# Patient Record
Sex: Female | Born: 1965 | Race: White | Hispanic: No | Marital: Married | Smoking: Never smoker
Health system: Southern US, Community
[De-identification: ages and names within clinical notes are randomized; demographics above are authoritative.]

## PROBLEM LIST (undated history)

## (undated) DIAGNOSIS — D496 Neoplasm of unspecified behavior of brain: Secondary | ICD-10-CM

## (undated) DIAGNOSIS — T7421XA Adult sexual abuse, confirmed, initial encounter: Secondary | ICD-10-CM

## (undated) DIAGNOSIS — D649 Anemia, unspecified: Secondary | ICD-10-CM

## (undated) DIAGNOSIS — T7840XA Allergy, unspecified, initial encounter: Secondary | ICD-10-CM

## (undated) HISTORY — DX: Allergy, unspecified, initial encounter: T78.40XA

## (undated) HISTORY — DX: Anemia, unspecified: D64.9

---
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
## 2002-11-05 ENCOUNTER — Other Ambulatory Visit: Admission: RE | Admit: 2002-11-05 | Discharge: 2002-11-05 | Payer: Self-pay | Admitting: Internal Medicine

## 2004-01-03 ENCOUNTER — Other Ambulatory Visit: Admission: RE | Admit: 2004-01-03 | Discharge: 2004-01-03 | Payer: Self-pay | Admitting: Internal Medicine

## 2005-01-02 ENCOUNTER — Other Ambulatory Visit: Admission: RE | Admit: 2005-01-02 | Discharge: 2005-01-02 | Payer: Self-pay | Admitting: Internal Medicine

## 2006-01-02 ENCOUNTER — Other Ambulatory Visit: Admission: RE | Admit: 2006-01-02 | Discharge: 2006-01-02 | Payer: Self-pay | Admitting: Internal Medicine

## 2006-01-24 ENCOUNTER — Encounter: Admission: RE | Admit: 2006-01-24 | Discharge: 2006-01-24 | Payer: Self-pay | Admitting: Internal Medicine

## 2006-02-12 ENCOUNTER — Encounter: Admission: RE | Admit: 2006-02-12 | Discharge: 2006-02-12 | Payer: Self-pay | Admitting: Internal Medicine

## 2007-05-19 ENCOUNTER — Other Ambulatory Visit: Admission: RE | Admit: 2007-05-19 | Discharge: 2007-05-19 | Payer: Self-pay | Admitting: Internal Medicine

## 2009-11-08 ENCOUNTER — Other Ambulatory Visit: Admission: RE | Admit: 2009-11-08 | Discharge: 2009-11-08 | Payer: Self-pay | Admitting: Internal Medicine

## 2013-02-03 ENCOUNTER — Other Ambulatory Visit (HOSPITAL_COMMUNITY)
Admission: RE | Admit: 2013-02-03 | Discharge: 2013-02-03 | Disposition: A | Discharge status: Home / Self Care | Payer: PRIVATE HEALTH INSURANCE | Source: Ambulatory Visit | Attending: Family Medicine | Admitting: Family Medicine

## 2013-02-03 DIAGNOSIS — Z1151 Encounter for screening for human papillomavirus (HPV): Secondary | ICD-10-CM | POA: Insufficient documentation

## 2013-02-03 DIAGNOSIS — Z01419 Encounter for gynecological examination (general) (routine) without abnormal findings: Secondary | ICD-10-CM | POA: Insufficient documentation

## 2013-02-03 DIAGNOSIS — R87810 Cervical high risk human papillomavirus (HPV) DNA test positive: Secondary | ICD-10-CM | POA: Insufficient documentation

## 2013-03-22 ENCOUNTER — Other Ambulatory Visit: Payer: Self-pay | Admitting: Emergency Medicine

## 2013-03-22 MED ORDER — NORETHIN ACE-ETH ESTRAD-FE 1-20 MG-MCG PO TABS: 1.0000 | ORAL_TABLET | Freq: Every day | ORAL | Status: DC

## 2013-06-12 ENCOUNTER — Encounter: Payer: Self-pay | Admitting: *Deleted

## 2013-06-12 DIAGNOSIS — T7840XA Allergy, unspecified, initial encounter: Secondary | ICD-10-CM | POA: Insufficient documentation

## 2013-06-15 ENCOUNTER — Encounter: Payer: Self-pay | Admitting: Emergency Medicine

## 2013-07-15 ENCOUNTER — Encounter: Payer: Self-pay | Admitting: Emergency Medicine

## 2014-06-16 ENCOUNTER — Encounter: Payer: Self-pay | Admitting: Emergency Medicine

## 2014-07-19 ENCOUNTER — Encounter: Payer: Self-pay | Admitting: Emergency Medicine

## 2015-05-08 ENCOUNTER — Other Ambulatory Visit: Payer: Self-pay

## 2015-05-08 DIAGNOSIS — Z1231 Encounter for screening mammogram for malignant neoplasm of breast: Secondary | ICD-10-CM

## 2015-05-23 ENCOUNTER — Ambulatory Visit
Admission: RE | Admit: 2015-05-23 | Discharge: 2015-05-23 | Disposition: A | Discharge status: Home / Self Care | Payer: BLUE CROSS/BLUE SHIELD | Source: Ambulatory Visit

## 2015-05-23 DIAGNOSIS — Z1231 Encounter for screening mammogram for malignant neoplasm of breast: Secondary | ICD-10-CM

## 2017-08-01 ENCOUNTER — Ambulatory Visit: Payer: BLUE CROSS/BLUE SHIELD | Admitting: Physician Assistant

## 2017-08-01 VITALS — BP 122/76 | HR 84 | Temp 98.1°F | Resp 16 | Ht 62.0 in | Wt 105.2 lb

## 2017-08-01 DIAGNOSIS — E559 Vitamin D deficiency, unspecified: Secondary | ICD-10-CM

## 2017-08-01 DIAGNOSIS — W57XXXA Bitten or stung by nonvenomous insect and other nonvenomous arthropods, initial encounter: Secondary | ICD-10-CM

## 2017-08-01 MED ORDER — DOXYCYCLINE HYCLATE 100 MG PO CAPS: ORAL_CAPSULE | ORAL | 0 refills | Status: DC

## 2017-08-01 NOTE — Progress Notes (Signed)
   Subjective:    Patient ID: Kristen Foley, female    DOB: 1965/05/31, 52 y.o.   MRN: [id number]  HPI 52 y.o. WF presents with tick bite. States was working in her yard and thinks she got it Monday evening and Tuesday morning noticed tick on left inguinal area. She has had some welps at the site but she has had other rashes/bruising on bilateral thighs.   No fever, chills, joint pain. Some discomfort behind left knee.   Blood pressure 122/76, pulse 84, temperature 98.1 F (36.7 C), resp. rate 16, height 5\' 2"  (1.575 m), weight 105 lb 3.2 oz (47.7 kg).  Medications Current Outpatient Medications on File Prior to Visit  Medication Sig  . aspirin 81 MG tablet Take 81 mg by mouth daily.  . Cholecalciferol (VITAMIN D) 2000 UNITS tablet Take 2,000 Units by mouth daily.   No current facility-administered medications on file prior to visit.     Problem list She has Allergy on their problem list.  Review of Systems See HPI    Objective:   Physical Exam  Skin: Skin is warm and dry.  Left inguinal area with scabbed area, middle of left inner thigh and right medial thigh with rash with central clearing. No lymphadenopathy.       Assessment & Plan:    Tick bite, initial encounter With rash with central clearing, will treat for possible lyme, 1 month Get on probiotic in the mean time If any yeast infections call the office.  -     doxycycline (VIBRAMYCIN) 100 MG capsule; Take 1 capsule twice daily with food

## 2017-08-01 NOTE — Patient Instructions (Signed)
Get on probiotic like align or restora while on the antitbiotic and for 1 month afterwards   Lyme Disease Lyme disease is an infection that affects many parts of the body, including the skin, joints, and nervous system. It is a bacterial infection that starts from the bite of an infected tick. The infection can spread, and some of the symptoms are similar to the flu. If Lyme disease is not treated, it may cause joint pain, swelling, numbness, problems thinking, fatigue, muscle weakness, and other problems. What are the causes? This condition is caused by bacteria called Borrelia burgdorferi. You can get Lyme disease by being bitten by an infected tick. The tick must be attached to your skin to pass along the infection. Deer often carry infected ticks. What increases the risk? The following factors may make you more likely to develop this condition:  Living in or visiting these areas in the [place] ? [place]. ? The [place]. ? The [place].  Spending time in wooded or grassy areas.  Being outdoors with exposed skin.  Camping, gardening, hiking, fishing, or hunting outdoors.  Failing to remove a tick from your skin within 3-4 days.  What are the signs or symptoms? Symptoms of this condition include:  A round, red rash that surrounds the center of the tick bite. This is the first sign of infection. The center of the rash may be blood colored or have tiny blisters.  Fatigue.  Headache.  Chills and fever.  General achiness.  Joint pain, often in the knees.  Muscle pain.  Swollen lymph glands.  Stiff neck.  How is this diagnosed? This condition is diagnosed based on:  Your symptoms and medical history.  A physical exam.  A blood test.  How is this treated? The main treatment for this condition is antibiotic medicine, which is usually taken by mouth (orally). The length of treatment depends on how soon after a tick bite you begin taking the medicine.  In some cases, treatment is necessary for several weeks. If the infection is severe, antibiotics may need to be given through an IV tube that is inserted into one of your veins. Follow these instructions at home:  Take your antibiotic medicine as told by your health care provider. Do not stop taking the antibiotic even if you start to feel better.  Ask your health care provider about takinga probiotic in between doses of your antibiotic to help avoid stomach upset or diarrhea.  Check with your health care provider before supplementing your treatment. Many alternative therapies have not been proven and may be harmful to you.  Keep all follow-up visits as told by your health care provider. This is important. How is this prevented? You can become reinfected if you get another tick bite from an infected tick. Take these steps to help prevent an infection:  Cover your skin with light-colored clothing when you are outdoors in the spring and summer months.  Spray clothing and skin with bug spray. The spray should be 20-30% DEET.  Avoid wooded, grassy, and shaded areas.  Remove yard litter, brush, trash, and plants that attract deer and rodents.  Check yourself for ticks when you come indoors.  Wash clothing worn each day.  Check your pets for ticks before they come inside.  If you find a tick: ? Remove it with tweezers. ? Clean your hands and the bite area with rubbing alcohol or soap and water.  Pregnant women should take special care to avoid tick  bites because the infection can be passed along to the fetus. Contact a health care provider if:  You have symptoms after treatment.  You have removed a tick and want to bring it to your health care provider for testing. Get help right away if:  You have an irregular heartbeat.  You have nerve pain.  Your face feels numb. This information is not intended to replace advice given to you by your health care provider. Make sure you  discuss any questions you have with your health care provider. Document Released: 07/01/2000 Document Revised: 11/14/2015 Document Reviewed: 11/14/2015 Elsevier Interactive Patient Education  2018 [employer].

## 2018-02-16 ENCOUNTER — Ambulatory Visit: Payer: Self-pay | Admitting: Internal Medicine

## 2018-02-16 NOTE — Progress Notes (Signed)
     C  A N  C  E L  L  E  D          

## 2018-03-09 ENCOUNTER — Other Ambulatory Visit: Payer: Self-pay | Admitting: Internal Medicine

## 2018-03-09 ENCOUNTER — Ambulatory Visit
Admission: RE | Admit: 2018-03-09 | Discharge: 2018-03-09 | Disposition: A | Discharge status: Home / Self Care | Payer: BLUE CROSS/BLUE SHIELD | Source: Ambulatory Visit | Attending: Internal Medicine | Admitting: Internal Medicine

## 2018-03-09 DIAGNOSIS — Z1231 Encounter for screening mammogram for malignant neoplasm of breast: Secondary | ICD-10-CM

## 2018-03-18 ENCOUNTER — Encounter: Payer: Self-pay | Admitting: Internal Medicine

## 2018-03-18 ENCOUNTER — Ambulatory Visit: Payer: BLUE CROSS/BLUE SHIELD | Admitting: Internal Medicine

## 2018-03-18 ENCOUNTER — Other Ambulatory Visit (HOSPITAL_COMMUNITY)
Admission: RE | Admit: 2018-03-18 | Discharge: 2018-03-18 | Disposition: A | Discharge status: Home / Self Care | Payer: BLUE CROSS/BLUE SHIELD | Source: Ambulatory Visit | Attending: Internal Medicine | Admitting: Internal Medicine

## 2018-03-18 VITALS — BP 116/76 | HR 84 | Temp 97.5°F | Resp 16 | Ht 62.0 in | Wt 105.0 lb

## 2018-03-18 DIAGNOSIS — Z136 Encounter for screening for cardiovascular disorders: Secondary | ICD-10-CM

## 2018-03-18 DIAGNOSIS — R7309 Other abnormal glucose: Secondary | ICD-10-CM

## 2018-03-18 DIAGNOSIS — Z124 Encounter for screening for malignant neoplasm of cervix: Secondary | ICD-10-CM | POA: Insufficient documentation

## 2018-03-18 DIAGNOSIS — Z0001 Encounter for general adult medical examination with abnormal findings: Secondary | ICD-10-CM

## 2018-03-18 DIAGNOSIS — Z1329 Encounter for screening for other suspected endocrine disorder: Secondary | ICD-10-CM

## 2018-03-18 DIAGNOSIS — Z131 Encounter for screening for diabetes mellitus: Secondary | ICD-10-CM | POA: Diagnosis not present

## 2018-03-18 DIAGNOSIS — E559 Vitamin D deficiency, unspecified: Secondary | ICD-10-CM | POA: Diagnosis not present

## 2018-03-18 DIAGNOSIS — Z111 Encounter for screening for respiratory tuberculosis: Secondary | ICD-10-CM | POA: Diagnosis not present

## 2018-03-18 DIAGNOSIS — Z1211 Encounter for screening for malignant neoplasm of colon: Secondary | ICD-10-CM

## 2018-03-18 DIAGNOSIS — Z13220 Encounter for screening for lipoid disorders: Secondary | ICD-10-CM | POA: Diagnosis not present

## 2018-03-18 DIAGNOSIS — Z79899 Other long term (current) drug therapy: Secondary | ICD-10-CM | POA: Diagnosis not present

## 2018-03-18 DIAGNOSIS — Z8249 Family history of ischemic heart disease and other diseases of the circulatory system: Secondary | ICD-10-CM | POA: Insufficient documentation

## 2018-03-18 DIAGNOSIS — R030 Elevated blood-pressure reading, without diagnosis of hypertension: Secondary | ICD-10-CM | POA: Diagnosis not present

## 2018-03-18 DIAGNOSIS — Z130 Encounter for screening for diseases of the blood and blood-forming organs and certain disorders involving the immune mechanism: Secondary | ICD-10-CM | POA: Diagnosis not present

## 2018-03-18 DIAGNOSIS — R5383 Other fatigue: Secondary | ICD-10-CM

## 2018-03-18 DIAGNOSIS — Z0000 Encounter for general adult medical examination without abnormal findings: Secondary | ICD-10-CM | POA: Diagnosis not present

## 2018-03-18 DIAGNOSIS — Z1212 Encounter for screening for malignant neoplasm of rectum: Secondary | ICD-10-CM

## 2018-03-18 DIAGNOSIS — Z1389 Encounter for screening for other disorder: Secondary | ICD-10-CM

## 2018-03-18 DIAGNOSIS — E782 Mixed hyperlipidemia: Secondary | ICD-10-CM

## 2018-03-18 NOTE — Patient Instructions (Signed)

## 2018-03-18 NOTE — Progress Notes (Signed)
[facility] Unk Pinto, M.D.     Uvaldo Bristle. Silverio Lay, P.A.-C Liane Comber, [address] [place] [address] Telephone [phone] Telefax [phone] Annual Screening/Preventative Visit & Comprehensive Evaluation &  Examination     This very nice 52 y.o. MWM presents for a Screening /Preventative Visit & comprehensive evaluation and management of multiple medical co-morbidities.  Patient has been followed expectantly for for elevated BP, lipids, abnormal glucose  and Vitamin D Deficiency.      Her  BP has been controlled  and patient denies any cardiac symptoms as chest pain, palpitations, shortness of breath, dizziness or ankle swelling. Today's BP is at goal - 116/76.      Patient's lipids are controlled with diet. Last lipids were sl elevated in with a LDL of 112 in 2007.      Patient has is monitored expectantly for glucose intolerance and patient denies reactive hypoglycemic symptoms, visual blurring, diabetic polys or paresthesias.      Finally, patient has history of Vitamin D Deficiency ("17" / 2009).  Current Outpatient Medications on File Prior to Visit  Medication Sig  . VITAMIN ) 2000 UNITS Take 2,000 Units by mouth daily.  Marland Kitchen aspirin 81 MG tablet Take 81 mg by mouth daily.   Allergies  Allergen Reactions  . Lexapro [Escitalopram Oxalate]   . Wellbutrin [Bupropion]   . Zoloft [Sertraline Hcl]    Past Medical History:  Diagnosis Date  . Allergy   . Anemia    Health Maintenance  Topic Date Due  . HIV Screening  04/02/1983  . PAP SMEAR  04/01/1989  . INFLUENZA VACCINE  02/07/2019 (Originally 11/06/2017)  . TETANUS/TDAP  03/19/2019 (Originally 04/02/1987)   Immunization History  Administered Date(s) Administered  . PPD Test 03/18/2018    Last MGM - 03/10/2018  Past Surgical History:  Procedure Laterality Date  . [place]   History reviewed.  No pertinent family history. Social History   Tobacco Use  . Smoking status: Not on file  Substance Use Topics  . Alcohol use: Not on file  . Drug use: Not on file    ROS Constitutional: Denies fever, chills, weight loss/gain, headaches, insomnia,  night sweats, and change in appetite. Does c/o fatigue. Eyes: Denies redness, blurred vision, diplopia, discharge, itchy, watery eyes.  ENT: Denies discharge, congestion, post nasal drip, epistaxis, sore throat, earache, hearing loss, dental pain, Tinnitus, Vertigo, Sinus pain, snoring.  Cardio: Denies chest pain, palpitations, irregular heartbeat, syncope, dyspnea, diaphoresis, orthopnea, PND, claudication, edema Respiratory: denies cough, dyspnea, DOE, pleurisy, hoarseness, laryngitis, wheezing.  Gastrointestinal: Denies dysphagia, heartburn, reflux, water brash, pain, cramps, nausea, vomiting, bloating, diarrhea, constipation, hematemesis, melena, hematochezia, jaundice, hemorrhoids Genitourinary: Denies dysuria, frequency, urgency, nocturia, hesitancy, discharge, hematuria, flank pain Breast: Breast lumps, nipple discharge, bleeding.  Musculoskeletal: Denies arthralgia, myalgia, stiffness, Jt. Swelling, pain, limp, and strain/sprain. Denies falls. Skin: Denies puritis, rash, hives, warts, acne, eczema, changing in skin lesion Neuro: No weakness, tremor, incoordination, spasms, paresthesia, pain Psychiatric: Denies confusion, memory loss, sensory loss. Denies Depression. Endocrine: Denies change in weight, skin, hair change, nocturia, and paresthesia, diabetic polys, visual blurring, hyper / hypo glycemic episodes.  Heme/Lymph: No excessive bleeding, bruising, enlarged lymph nodes.  Physical Exam  BP 116/76   Pulse 84   Temp (!) 97.5 F (36.4 C)   Resp 16   Ht 5\' 2"  (1.575 m)   Wt 105 lb (47.6 kg)   BMI 19.20 kg/m  General Appearance: Well nourished, well groomed and in no apparent distress.  Eyes: PERRLA, EOMs, conjunctiva no  swelling or erythema, normal fundi and vessels. Sinuses: No frontal/maxillary tenderness ENT/Mouth: EACs patent / TMs  nl. Nares clear without erythema, swelling, mucoid exudates. Oral hygiene is good. No erythema, swelling, or exudate. Tongue normal, non-obstructing. Tonsils not swollen or erythematous. Hearing normal.  Neck: Supple, thyroid not palpable. No bruits, nodes or JVD. Respiratory: Respiratory effort normal.  BS equal and clear bilateral without rales, rhonci, wheezing or stridor. Cardio: Heart sounds are normal with regular rate and rhythm and no murmurs, rubs or gallops. Peripheral pulses are normal and equal bilaterally without edema. No aortic or femoral bruits. Chest: symmetric with normal excursions and percussion. Breasts: Symmetric, without lumps, nipple discharge, retractions, or fibrocystic changes.  Abdomen: Flat, soft with bowel sounds active. Nontender, no guarding, rebound, hernias, masses, or organomegaly.  Lymphatics: Non tender without lymphadenopathy.  Genitourinary: Pap taken at Cx.  Musculoskeletal: Full ROM all peripheral extremities, joint stability, 5/5 strength, and normal gait. Skin: Warm and dry without rashes, lesions, cyanosis, clubbing or  ecchymosis.  Neuro: Cranial nerves intact, reflexes equal bilaterally. Normal muscle tone, no cerebellar symptoms. Sensation intact.  Pysch: Alert and oriented X 3, normal affect, Insight and Judgment appropriate.   Assessment and Plan  1. Annual Preventative Screening Examination  2. Elevated BP without diagnosis of hypertension  - EKG 12-Lead - Urinalysis, Routine w reflex microscopic - Microalbumin / creatinine urine ratio - CBC with Differential/Platelet - COMPLETE METABOLIC PANEL WITH GFR - Magnesium - TSH  3. Hyperlipidemia, mixed  - Lipid panel - TSH  4. Abnormal glucose  - Insulin, random  5. Vitamin D deficiency  - VITAMIN D 25 Hydroxyl  6. Screening-pulmonary TB  - TB Skin Test  7.  Screening for colorectal cancer  - POC Hemoccult Bld/Stl  - Cologuard  8. Screening for ischemic heart disease  - EKG 12-Lead - Lipid panel  9. FH: hypertension  - EKG 12-Lead  10. Fatigue, unspecified type  - Iron,Total/Total Iron Binding Cap - Vitamin B12 - CBC with Differential/Platelet - TSH  11. Medication management  - Urinalysis, Routine w reflex microscopic - Microalbumin / creatinine urine ratio - CBC with Differential/Platelet - COMPLETE METABOLIC PANEL WITH GFR - Magnesium - Lipid panel - TSH - Hemoglobin A1c - Insulin, random - VITAMIN D 25 Hydroxyl  12. Screening for cervical cancer  - Cytology -  pap        Patient was counseled in prudent diet to achieve/maintain BMI less than 25 for weight control, BP monitoring, regular exercise and medications. Discussed med's effects and SE's. Screening labs and tests as requested with regular follow-up as recommended. Over 40 minutes of exam, counseling, chart review and high complex critical decision making was performed.

## 2018-03-19 LAB — LIPID PANEL
Cholesterol: 206 mg/dL — ABNORMAL HIGH (ref <200)
HDL: 95 mg/dL (ref >50)
LDL Cholesterol (Calc): 94 mg/dL (calc)
Non-HDL Cholesterol (Calc): 111 mg/dL (calc) (ref <130)
TRIGLYCERIDES: 81 mg/dL (ref <150)
Total CHOL/HDL Ratio: 2.2 (calc) (ref <5.0)

## 2018-03-19 LAB — CBC WITH DIFFERENTIAL/PLATELET
BASOS ABS: 24 {cells}/uL (ref 0–200)
Basophils Relative: 0.3 %
Eosinophils Absolute: 32 cells/uL (ref 15–500)
Eosinophils Relative: 0.4 %
HEMATOCRIT: 40.4 % (ref 35.0–45.0)
HEMOGLOBIN: 13.6 g/dL (ref 11.7–15.5)
Lymphs Abs: 1272 cells/uL (ref 850–3900)
MCH: 31.6 pg (ref 27.0–33.0)
MCHC: 33.7 g/dL (ref 32.0–36.0)
MCV: 93.7 fL (ref 80.0–100.0)
MONOS PCT: 6.2 %
MPV: 10.7 fL (ref 7.5–12.5)
Neutro Abs: 6083 cells/uL (ref 1500–7800)
Neutrophils Relative %: 77 %
Platelets: 258 10*3/uL (ref 140–400)
RBC: 4.31 10*6/uL (ref 3.80–5.10)
RDW: 11.5 % (ref 11.0–15.0)
Total Lymphocyte: 16.1 %
WBC: 7.9 10*3/uL (ref 3.8–10.8)
WBCMIX: 490 {cells}/uL (ref 200–950)

## 2018-03-19 LAB — URINALYSIS, ROUTINE W REFLEX MICROSCOPIC
BILIRUBIN URINE: NEGATIVE
Bacteria, UA: NONE SEEN /HPF
Glucose, UA: NEGATIVE
Hgb urine dipstick: NEGATIVE
Hyaline Cast: NONE SEEN /LPF
Ketones, ur: NEGATIVE
Nitrite: NEGATIVE
PH: 6.5 (ref 5.0–8.0)
Protein, ur: NEGATIVE
RBC / HPF: NONE SEEN /HPF (ref 0–2)
Specific Gravity, Urine: 1.009 (ref 1.001–1.03)

## 2018-03-19 LAB — COMPLETE METABOLIC PANEL WITH GFR
AG Ratio: 1.7 (calc) (ref 1.0–2.5)
ALBUMIN MSPROF: 4.6 g/dL (ref 3.6–5.1)
ALT: 10 U/L (ref 6–29)
AST: 15 U/L (ref 10–35)
Alkaline phosphatase (APISO): 55 U/L (ref 33–115)
BUN: 17 mg/dL (ref 7–25)
CALCIUM: 9.9 mg/dL (ref 8.6–10.2)
CHLORIDE: 104 mmol/L (ref 98–110)
CO2: 27 mmol/L (ref 20–32)
CREATININE: 0.92 mg/dL (ref 0.50–1.10)
GFR, EST NON AFRICAN AMERICAN: 73 mL/min/{1.73_m2} (ref >=60)
GFR, Est African American: 85 mL/min/{1.73_m2} (ref >=60)
Globulin: 2.7 g/dL (calc) (ref 1.9–3.7)
Glucose, Bld: 89 mg/dL (ref 65–99)
Potassium: 4.1 mmol/L (ref 3.5–5.3)
SODIUM: 140 mmol/L (ref 135–146)
Total Bilirubin: 0.5 mg/dL (ref 0.2–1.2)
Total Protein: 7.3 g/dL (ref 6.1–8.1)

## 2018-03-19 LAB — HEMOGLOBIN A1C
EAG (MMOL/L): 5.4 (calc)
Hgb A1c MFr Bld: 5 % of total Hgb (ref <5.7)
MEAN PLASMA GLUCOSE: 97 (calc)

## 2018-03-19 LAB — MICROALBUMIN / CREATININE URINE RATIO: Creatinine, Urine: 33 mg/dL (ref 20–275)

## 2018-03-19 LAB — MAGNESIUM: MAGNESIUM: 1.8 mg/dL (ref 1.5–2.5)

## 2018-03-19 LAB — INSULIN, RANDOM: Insulin: 4.1 u[IU]/mL (ref 2.0–19.6)

## 2018-03-19 LAB — VITAMIN D 25 HYDROXY (VIT D DEFICIENCY, FRACTURES): VIT D 25 HYDROXY: 46 ng/mL (ref 30–100)

## 2018-03-19 LAB — VITAMIN B12: Vitamin B-12: 365 pg/mL (ref 200–1100)

## 2018-03-19 LAB — IRON, TOTAL/TOTAL IRON BINDING CAP
%SAT: 18 % (calc) (ref 16–45)
Iron: 62 ug/dL (ref 40–190)
TIBC: 353 mcg/dL (calc) (ref 250–450)

## 2018-03-19 LAB — TSH: TSH: 0.94 mIU/L

## 2018-03-19 NOTE — Addendum Note (Signed)
Addended by: Unk Pinto on: 03/19/2018 02:14 PM   Modules accepted: Orders

## 2018-03-20 LAB — TB SKIN TEST
INDURATION: 0 mm
TB SKIN TEST: NEGATIVE

## 2018-03-24 LAB — CYTOLOGY - NON PAP
ADEQUACY: ABSENT
DIAGNOSIS: NEGATIVE
HPV: NOT DETECTED

## 2018-04-07 ENCOUNTER — Telehealth: Payer: Self-pay | Admitting: *Deleted

## 2018-04-07 LAB — COLOGUARD: Cologuard: NEGATIVE

## 2018-04-07 NOTE — Telephone Encounter (Signed)
Let message to inform patient of normal Cologuard and 3 year follow up.

## 2018-04-13 ENCOUNTER — Encounter: Payer: Self-pay | Admitting: *Deleted

## 2019-04-14 ENCOUNTER — Encounter: Payer: Self-pay | Admitting: Internal Medicine

## 2019-04-14 NOTE — Patient Instructions (Signed)

## 2019-04-14 NOTE — Progress Notes (Signed)
Annual Screening/Preventative Visit & Comprehensive Evaluation &  Examination     This very nice 54 y.o. MWF  presents for a Screening /Preventative Visit & comprehensive evaluation and management of multiple medical co-morbidities.  Patient has been followed expectantly for elevated BP , HLD, glucose intolerance and Vitamin D Deficiency.      Patient's BP has been controlled at home and patient denies any cardiac symptoms as chest pain, palpitations, shortness of breath, dizziness or ankle swelling. Today's BP is at goal - 132/68.      Patient's lipids are  controlled with diet. She does have remote hx/o elevated LDL of 112 in 2007.  Last lipids were at goal with a very high good HDL:  Lab Results  Component Value Date   CHOL 206 (H) 03/18/2018   HDL 95 03/18/2018   LDLCALC 94 03/18/2018   TRIG 81 03/18/2018   CHOLHDL 2.2 03/18/2018      Patient his monitored for abnormal glucose and patient denies reactive hypoglycemic symptoms, visual blurring, diabetic polys or paresthesias. Last A1c was Normal & at goal:  Lab Results  Component Value Date   HGBA1C 5.0 03/18/2018      Finally, patient has history of Vitamin D Deficiency ("17" / 2009)  and last Vitamin D was Low (goal is 70-100):  Lab Results  Component Value Date   VD25OH 46 03/18/2018   Current Outpatient Medications on File Prior to Visit  Medication Sig  . aspirin 81 MG tablet Take  daily.  Marland Kitchen VITAMIN D 2000 UNITS Take daily.    Allergies  Allergen Reactions  . Lexapro [Escitalopram Oxalate]   . Wellbutrin [Bupropion]   . Zoloft [Sertraline Hcl]    Past Medical History:  Diagnosis Date  . Allergy   . Anemia    Health Maintenance  Topic Date Due  . HIV Screening  04/02/1983  . TETANUS/TDAP  04/02/1987  . PAP SMEAR-Modifier  04/01/1989  . INFLUENZA VACCINE  11/07/2018  . MAMMOGRAM  03/09/2020  . Fecal DNA (Cologuard)  04/07/2021   Immunization History  Administered Date(s) Administered  . PPD Test  03/18/2018   Cologard - 04/07/2018 - Negative - Recc 3 year f/u due Jan 2023  Last MGM - 03/09/2018  Past Surgical History:  Procedure Laterality Date  . [place]   History reviewed. No pertinent family history. Social History   Tobacco Use  . Smoking status: Never Smoker  . Smokeless tobacco: Never Used  Substance Use Topics  . Alcohol use: Not on file  . Drug use: Not on file    ROS Constitutional: Denies fever, chills, weight loss/gain, headaches, insomnia,  night sweats, and change in appetite. Does c/o fatigue. Eyes: Denies redness, blurred vision, diplopia, discharge, itchy, watery eyes.  ENT: Denies discharge, congestion, post nasal drip, epistaxis, sore throat, earache, hearing loss, dental pain, Tinnitus, Vertigo, Sinus pain, snoring.  Cardio: Denies chest pain, palpitations, irregular heartbeat, syncope, dyspnea, diaphoresis, orthopnea, PND, claudication, edema Respiratory: denies cough, dyspnea, DOE, pleurisy, hoarseness, laryngitis, wheezing.  Gastrointestinal: Denies dysphagia, heartburn, reflux, water brash, pain, cramps, nausea, vomiting, bloating, diarrhea, constipation, hematemesis, melena, hematochezia, jaundice, hemorrhoids Genitourinary: Denies dysuria, frequency, urgency, nocturia, hesitancy, discharge, hematuria, flank pain Breast: Breast lumps, nipple discharge, bleeding.  Musculoskeletal: Denies arthralgia, myalgia, stiffness, Jt. Swelling, pain, limp, and strain/sprain. Denies falls. Skin: Denies puritis, rash, hives, warts, acne, eczema, changing in skin lesion Neuro: No weakness, tremor, incoordination, spasms, paresthesia, pain Psychiatric: Denies confusion, memory loss, sensory loss. Denies Depression. Endocrine: Denies  change in weight, skin, hair change, nocturia, and paresthesia, diabetic polys, visual blurring, hyper / hypo glycemic episodes.  Heme/Lymph: No excessive bleeding, bruising, enlarged lymph nodes.  Physical  Exam  BP 132/68   Pulse 72   Temp (!) 97.2 F (36.2 C)   Resp 16   Ht 5\' 2"  (1.575 m)   Wt 106 lb 12.8 oz (48.4 kg)   BMI 19.53 kg/m   General Appearance: Well nourished, well groomed and in no apparent distress.  Eyes: PERRLA, EOMs, conjunctiva no swelling or erythema, normal fundi and vessels. Sinuses: No frontal/maxillary tenderness ENT/Mouth: EACs patent / TMs  nl. Nares clear without erythema, swelling, mucoid exudates. Oral hygiene is good. No erythema, swelling, or exudate. Tongue normal, non-obstructing. Tonsils not swollen or erythematous. Hearing normal.  Neck: Supple, thyroid not palpable. No bruits, nodes or JVD. Respiratory: Respiratory effort normal.  BS equal and clear bilateral without rales, rhonci, wheezing or stridor. Cardio: Heart sounds are normal with regular rate and rhythm and no murmurs, rubs or gallops. Peripheral pulses are normal and equal bilaterally without edema. No aortic or femoral bruits. Chest: symmetric with normal excursions and percussion. Breasts: Symmetric, without lumps, nipple discharge, retractions, or fibrocystic changes.  Abdomen: Flat, soft with bowel sounds active. Nontender, no guarding, rebound, hernias, masses, or organomegaly.  Lymphatics: Non tender without lymphadenopathy.  Musculoskeletal: Full ROM all peripheral extremities, joint stability, 5/5 strength, and normal gait. Skin: Warm and dry without rashes, lesions, cyanosis, clubbing or  ecchymosis.  Neuro: Cranial nerves intact, reflexes equal bilaterally. Normal muscle tone, no cerebellar symptoms. Sensation intact.  Pysch: Alert and oriented X 3, normal affect, Insight and Judgment appropriate.   Assessment and Plan  1. Annual Preventative Screening Examination  2. Screening for hypertension  - [place], retroperitnl abd,  ltd  3. Screening examination for pulmonary tuberculosis  - TB Skin Test  4. Screening for ischemic heart disease  - [place], retroperitnl abd,  ltd  5.  FH: hypertension  - [place], retroperitnl abd,  ltd         Patient was counseled in prudent diet to achieve/maintain BMI less than 25 for weight control, BP monitoring, regular exercise and medications. Discussed med's effects and SE's. Screening labs were deferred due to high insurance plan deductible and last year's results were all  Normal . Over 40 minutes of exam, counseling, chart review and critical decision making was performed.   Kirtland Bouchard, MD

## 2019-04-15 ENCOUNTER — Other Ambulatory Visit: Payer: Self-pay

## 2019-04-15 ENCOUNTER — Ambulatory Visit (INDEPENDENT_AMBULATORY_CARE_PROVIDER_SITE_OTHER): Payer: 59 | Admitting: Internal Medicine

## 2019-04-15 VITALS — BP 132/68 | HR 72 | Temp 97.2°F | Resp 16 | Ht 62.0 in | Wt 106.8 lb

## 2019-04-15 DIAGNOSIS — Z0000 Encounter for general adult medical examination without abnormal findings: Secondary | ICD-10-CM

## 2019-04-15 DIAGNOSIS — Z111 Encounter for screening for respiratory tuberculosis: Secondary | ICD-10-CM | POA: Diagnosis not present

## 2019-04-15 DIAGNOSIS — Z8249 Family history of ischemic heart disease and other diseases of the circulatory system: Secondary | ICD-10-CM | POA: Diagnosis not present

## 2019-04-15 DIAGNOSIS — Z0001 Encounter for general adult medical examination with abnormal findings: Secondary | ICD-10-CM

## 2019-04-15 DIAGNOSIS — Z136 Encounter for screening for cardiovascular disorders: Secondary | ICD-10-CM

## 2019-04-15 DIAGNOSIS — R030 Elevated blood-pressure reading, without diagnosis of hypertension: Secondary | ICD-10-CM | POA: Diagnosis not present

## 2019-04-15 DIAGNOSIS — Z79899 Other long term (current) drug therapy: Secondary | ICD-10-CM

## 2019-04-18 ENCOUNTER — Encounter: Payer: Self-pay | Admitting: Internal Medicine

## 2019-05-30 ENCOUNTER — Emergency Department (HOSPITAL_COMMUNITY): Payer: 59

## 2019-05-30 ENCOUNTER — Inpatient Hospital Stay (HOSPITAL_COMMUNITY)
Admission: EM | Admit: 2019-05-30 | Discharge: 2019-06-03 | DRG: 100 | Disposition: A | Discharge status: Home / Self Care | Payer: 59 | Attending: Internal Medicine | Admitting: Internal Medicine

## 2019-05-30 DIAGNOSIS — R4701 Aphasia: Secondary | ICD-10-CM | POA: Diagnosis present

## 2019-05-30 DIAGNOSIS — Z791 Long term (current) use of non-steroidal anti-inflammatories (NSAID): Secondary | ICD-10-CM

## 2019-05-30 DIAGNOSIS — Z7982 Long term (current) use of aspirin: Secondary | ICD-10-CM

## 2019-05-30 DIAGNOSIS — E8809 Other disorders of plasma-protein metabolism, not elsewhere classified: Secondary | ICD-10-CM | POA: Diagnosis present

## 2019-05-30 DIAGNOSIS — G40A01 Absence epileptic syndrome, not intractable, with status epilepticus: Secondary | ICD-10-CM | POA: Diagnosis not present

## 2019-05-30 DIAGNOSIS — R4702 Dysphasia: Secondary | ICD-10-CM | POA: Diagnosis present

## 2019-05-30 DIAGNOSIS — G0481 Other encephalitis and encephalomyelitis: Secondary | ICD-10-CM | POA: Diagnosis present

## 2019-05-30 DIAGNOSIS — N83202 Unspecified ovarian cyst, left side: Secondary | ICD-10-CM | POA: Diagnosis present

## 2019-05-30 DIAGNOSIS — D496 Neoplasm of unspecified behavior of brain: Secondary | ICD-10-CM | POA: Diagnosis present

## 2019-05-30 DIAGNOSIS — Z8249 Family history of ischemic heart disease and other diseases of the circulatory system: Secondary | ICD-10-CM

## 2019-05-30 DIAGNOSIS — R200 Anesthesia of skin: Secondary | ICD-10-CM | POA: Diagnosis present

## 2019-05-30 DIAGNOSIS — R404 Transient alteration of awareness: Secondary | ICD-10-CM | POA: Diagnosis present

## 2019-05-30 DIAGNOSIS — R569 Unspecified convulsions: Secondary | ICD-10-CM

## 2019-05-30 DIAGNOSIS — R202 Paresthesia of skin: Secondary | ICD-10-CM | POA: Diagnosis present

## 2019-05-30 DIAGNOSIS — N83209 Unspecified ovarian cyst, unspecified side: Secondary | ICD-10-CM

## 2019-05-30 DIAGNOSIS — R479 Unspecified speech disturbances: Secondary | ICD-10-CM | POA: Diagnosis present

## 2019-05-30 DIAGNOSIS — Z888 Allergy status to other drugs, medicaments and biological substances status: Secondary | ICD-10-CM

## 2019-05-30 DIAGNOSIS — A6920 Lyme disease, unspecified: Secondary | ICD-10-CM | POA: Diagnosis present

## 2019-05-30 DIAGNOSIS — Z79899 Other long term (current) drug therapy: Secondary | ICD-10-CM

## 2019-05-30 DIAGNOSIS — E785 Hyperlipidemia, unspecified: Secondary | ICD-10-CM | POA: Diagnosis present

## 2019-05-30 DIAGNOSIS — R918 Other nonspecific abnormal finding of lung field: Secondary | ICD-10-CM | POA: Diagnosis present

## 2019-05-30 DIAGNOSIS — Z20822 Contact with and (suspected) exposure to covid-19: Secondary | ICD-10-CM | POA: Diagnosis present

## 2019-05-30 LAB — RAPID URINE DRUG SCREEN, HOSP PERFORMED
Amphetamines: NOT DETECTED
Barbiturates: NOT DETECTED
Benzodiazepines: NOT DETECTED
Cocaine: NOT DETECTED
Opiates: NOT DETECTED
Tetrahydrocannabinol: NOT DETECTED

## 2019-05-30 LAB — ETHANOL: Alcohol, Ethyl (B): <10 mg/dL (ref <10)

## 2019-05-30 LAB — COMPREHENSIVE METABOLIC PANEL
ALT: 17 U/L (ref 0–44)
AST: 26 U/L (ref 15–41)
Albumin: 4.4 g/dL (ref 3.5–5.0)
Alkaline Phosphatase: 54 U/L (ref 38–126)
Anion gap: 13 (ref 5–15)
BUN: 14 mg/dL (ref 6–20)
CO2: 23 mmol/L (ref 22–32)
Calcium: 9.6 mg/dL (ref 8.9–10.3)
Chloride: 102 mmol/L (ref 98–111)
Creatinine, Ser: 0.85 mg/dL (ref 0.44–1.00)
GFR calc Af Amer: >60 mL/min (ref >60)
GFR calc non Af Amer: >60 mL/min (ref >60)
Glucose, Bld: 120 mg/dL — ABNORMAL HIGH (ref 70–99)
Potassium: 3.7 mmol/L (ref 3.5–5.1)
Sodium: 138 mmol/L (ref 135–145)
Total Bilirubin: 0.6 mg/dL (ref 0.3–1.2)
Total Protein: 7.4 g/dL (ref 6.5–8.1)

## 2019-05-30 LAB — I-STAT CHEM 8, ED
BUN: 15 mg/dL (ref 6–20)
Calcium, Ion: 1.18 mmol/L (ref 1.15–1.40)
Chloride: 103 mmol/L (ref 98–111)
Creatinine, Ser: 0.6 mg/dL (ref 0.44–1.00)
Glucose, Bld: 113 mg/dL — ABNORMAL HIGH (ref 70–99)
HCT: 44 % (ref 36.0–46.0)
Hemoglobin: 15 g/dL (ref 12.0–15.0)
Potassium: 3.6 mmol/L (ref 3.5–5.1)
Sodium: 139 mmol/L (ref 135–145)
TCO2: 25 mmol/L (ref 22–32)

## 2019-05-30 LAB — URINALYSIS, ROUTINE W REFLEX MICROSCOPIC
Bilirubin Urine: NEGATIVE
Glucose, UA: NEGATIVE mg/dL
Ketones, ur: 15 mg/dL — AB
Leukocytes,Ua: NEGATIVE
Nitrite: NEGATIVE
Protein, ur: NEGATIVE mg/dL
Specific Gravity, Urine: <1.005 — ABNORMAL LOW (ref 1.005–1.030)
pH: 5 (ref 5.0–8.0)

## 2019-05-30 LAB — CBC
HCT: 44.3 % (ref 36.0–46.0)
Hemoglobin: 14.2 g/dL (ref 12.0–15.0)
MCH: 31.3 pg (ref 26.0–34.0)
MCHC: 32.1 g/dL (ref 30.0–36.0)
MCV: 97.6 fL (ref 80.0–100.0)
Platelets: 244 10*3/uL (ref 150–400)
RBC: 4.54 MIL/uL (ref 3.87–5.11)
RDW: 11.9 % (ref 11.5–15.5)
WBC: 8.1 10*3/uL (ref 4.0–10.5)
nRBC: 0 % (ref 0.0–0.2)

## 2019-05-30 LAB — DIFFERENTIAL
Abs Immature Granulocytes: 0.02 10*3/uL (ref 0.00–0.07)
Basophils Absolute: 0 10*3/uL (ref 0.0–0.1)
Basophils Relative: 0 %
Eosinophils Absolute: 0 10*3/uL (ref 0.0–0.5)
Eosinophils Relative: 0 %
Immature Granulocytes: 0 %
Lymphocytes Relative: 13 %
Lymphs Abs: 1.1 10*3/uL (ref 0.7–4.0)
Monocytes Absolute: 0.6 10*3/uL (ref 0.1–1.0)
Monocytes Relative: 7 %
Neutro Abs: 6.4 10*3/uL (ref 1.7–7.7)
Neutrophils Relative %: 80 %

## 2019-05-30 LAB — PROTIME-INR
INR: 1.1 (ref 0.8–1.2)
Prothrombin Time: 13.9 seconds (ref 11.4–15.2)

## 2019-05-30 LAB — I-STAT BETA HCG BLOOD, ED (MC, WL, AP ONLY): I-stat hCG, quantitative: <5 m[IU]/mL (ref <5)

## 2019-05-30 LAB — URINALYSIS, MICROSCOPIC (REFLEX)

## 2019-05-30 LAB — APTT: aPTT: 28 seconds (ref 24–36)

## 2019-05-30 LAB — CBG MONITORING, ED: Glucose-Capillary: 110 mg/dL — ABNORMAL HIGH (ref 70–99)

## 2019-05-30 LAB — MAGNESIUM: Magnesium: 1.7 mg/dL (ref 1.7–2.4)

## 2019-05-30 MED ORDER — SODIUM CHLORIDE 0.9 % IV SOLN
2000.0000 mg | Freq: Once | INTRAVENOUS | Status: AC
  Administered 2019-05-30: 2000 mg via INTRAVENOUS
  Filled 2019-05-30: qty 20

## 2019-05-30 MED ORDER — GADOBUTROL 1 MMOL/ML IV SOLN
5.5000 mL | Freq: Once | INTRAVENOUS | Status: AC | PRN
  Administered 2019-05-30: 21:00:00 5.5 mL via INTRAVENOUS

## 2019-05-30 MED ORDER — IOHEXOL 350 MG/ML SOLN
100.0000 mL | Freq: Once | INTRAVENOUS | Status: AC | PRN
  Administered 2019-05-30: 100 mL via INTRAVENOUS

## 2019-05-30 MED ORDER — LEVETIRACETAM IN NACL 1000 MG/100ML IV SOLN
1000.0000 mg | Freq: Two times a day (BID) | INTRAVENOUS | Status: DC
  Administered 2019-05-31 – 2019-06-01 (×3): 1000 mg via INTRAVENOUS
  Filled 2019-05-30 (×3): qty 100

## 2019-05-30 MED ORDER — SODIUM CHLORIDE 0.9 % IV BOLUS
1000.0000 mL | Freq: Once | INTRAVENOUS | Status: AC
  Administered 2019-05-31: 1000 mL via INTRAVENOUS

## 2019-05-30 NOTE — ED Provider Notes (Signed)
[facility] EMERGENCY DEPARTMENT Provider Note   CSN: [id number] Arrival date & time: 05/30/19  1858  An emergency department physician performed an initial assessment on this suspected stroke patient at 35.  History Chief Complaint  Patient presents with   Tingling    Kristen Foley is a 54 y.o. female.  Pt presents to the ED today with tingling to right leg and arm.  She also has some word finding problems and difficulty speaking.  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  She said they seemed to be getting worse, so she came in.  She is able to walk and move everything.        Past Medical History:  Diagnosis Date   Allergy    Anemia     Patient Active Problem List   Diagnosis Date Noted   Numbness and tingling of right leg 05/30/2019   Speech disturbance 05/30/2019   Transient alteration of awareness 05/30/2019   Encounter for general adult medical examination with abnormal findings 03/18/2018   Fatigue 03/18/2018   FH: hypertension 03/18/2018   Abnormal glucose 03/18/2018   Hyperlipidemia, mixed 03/18/2018   Elevated BP without diagnosis of hypertension 03/18/2018   Vitamin D deficiency 03/18/2018   Allergy     Past Surgical History:  Procedure Laterality Date   MANDIBLE RECONSTRUCTION  1986     OB History   No obstetric history on file.     No family history on file.  Social History   Tobacco Use   Smoking status: Never Smoker   Smokeless tobacco: Never Used  Substance Use Topics   Alcohol use: Not on file   Drug use: Not on file    Home Medications Prior to Admission medications   Medication Sig Start Date End Date Taking? Authorizing Provider  acetaminophen (TYLENOL) 325 MG tablet Take 325-650 mg by mouth every 6 (six) hours as needed for mild pain or headache.   Yes [provider]  Ascorbic Acid (VITAMIN C) 500 MG CAPS Take 500-1,000 mg by mouth every other day.   Yes  [provider]  aspirin 81 MG tablet Take 81 mg by mouth 2 (two) times a week.    Yes [provider]  Cholecalciferol (VITAMIN D3) 125 MCG (5000 UT) CAPS Take 10,000 Units by mouth daily.   Yes [provider]  ferrous sulfate 325 (65 FE) MG EC tablet Take 325 mg by mouth 3 (three) times a week.   Yes [provider]  ibuprofen (ADVIL) 200 MG tablet Take 200-400 mg by mouth every 6 (six) hours as needed for headache or mild pain.   Yes [provider]  KRILL OIL PO Take 1 capsule by mouth 3 (three) times a week.    Yes [provider]  Magnesium 500 MG TABS Take 500 mg by mouth 2 (two) times a week.    Yes [provider]    Allergies    Lexapro [escitalopram oxalate], Wellbutrin [bupropion], and Zoloft [sertraline hcl]  Review of Systems   Review of Systems  Neurological: Positive for speech difficulty and numbness.  All other systems reviewed and are negative.   Physical Exam Updated Vital Signs BP (!) 142/80    Pulse 98    Temp 98.9 F (37.2 C) (Oral)    Resp 17    Ht 5' 11" (1.803 m)    Wt 52.4 kg    SpO2 98%    BMI  kg/m   Physical Exam Vitals and nursing note reviewed.  Constitutional:      Appearance: Normal appearance.  HENT:     Head: Normocephalic and atraumatic.     Right Ear: External ear normal.     Left Ear: External ear normal.     Nose: Nose normal.     Mouth/Throat:     Mouth: Mucous membranes are moist.     Pharynx: Oropharynx is clear.  Eyes:     Extraocular Movements: Extraocular movements intact.     Conjunctiva/sclera: Conjunctivae normal.     Pupils: Pupils are equal, round, and reactive to light.  Cardiovascular:     Rate and Rhythm: Normal rate and regular rhythm.     Pulses: Normal pulses.     Heart sounds: Normal heart sounds.  Pulmonary:     Effort: Pulmonary effort is normal.     Breath sounds: Normal breath sounds.  Abdominal:     General: Abdomen is flat. Bowel sounds  are normal.     Palpations: Abdomen is soft.  Musculoskeletal:        General: Normal range of motion.     Cervical back: Normal range of motion and neck supple.  Skin:    General: Skin is warm.     Capillary Refill: Capillary refill takes less than 2 seconds.  Neurological:     Mental Status: She is alert and oriented to person, place, and time.     Comments: Pt has word finding problems.  Subjective numbness in right leg and arm.  Psychiatric:        Behavior: Behavior normal.     ED Results / Procedures / Treatments   Labs (all labs ordered are listed, but only abnormal results are displayed) Labs Reviewed  COMPREHENSIVE METABOLIC PANEL - Abnormal; Notable for the following components:      Result Value   Glucose, Bld 120 (*)    All other components within normal limits  URINALYSIS, ROUTINE W REFLEX MICROSCOPIC - Abnormal; Notable for the following components:   Specific Gravity, Urine <1.005 (*)    Hgb urine dipstick TRACE (*)    Ketones, ur 15 (*)    All other components within normal limits  URINALYSIS, MICROSCOPIC (REFLEX) - Abnormal; Notable for the following components:   Bacteria, UA RARE (*)    All other components within normal limits  I-STAT CHEM 8, ED - Abnormal; Notable for the following components:   Glucose, Bld 113 (*)    All other components within normal limits  CBG MONITORING, ED - Abnormal; Notable for the following components:   Glucose-Capillary 110 (*)    All other components within normal limits  CSF CULTURE  GRAM STAIN  HSV CULTURE AND TYPING  FUNGUS CULTURE WITH STAIN  SARS CORONAVIRUS 2 (TAT 6-24 HRS)  ETHANOL  PROTIME-INR  APTT  CBC  DIFFERENTIAL  RAPID URINE DRUG SCREEN, HOSP PERFORMED  MAGNESIUM  CSF CELL COUNT WITH DIFFERENTIAL  CSF CELL COUNT WITH DIFFERENTIAL  GLUCOSE, CSF  PROTEIN, CSF  CRYPTOCOCCAL ANTIGEN, CSF  HERPES SIMPLEX VIRUS(HSV) DNA BY PCR  VARICELLA-ZOSTER BY PCR  OLIGOCLONAL BANDS, CSF + SERM  DRAW EXTRA CLOT  TUBE  IGG CSF INDEX  DRAW EXTRA CLOT TUBE  I-STAT BETA HCG BLOOD, ED ([facility], [facility], [facility] ONLY)    EKG EKG Interpretation  Date/Time:  Sunday May 30 2019 20:52:43 EST Ventricular Rate:  109 PR Interval:    QRS Duration: 93 QT Interval:  359 QTC Calculation: 484 R  R Axis:   84 Text Interpretation: Sinus tachycardia Consider left ventricular hypertrophy No old tracing to compare Confirmed by Isla Pence [phone]) on 05/30/2019 9:12:56 PM   Radiology CT Code Stroke CTA Head W/WO contrast  Result Date: 05/30/2019 CLINICAL DATA:  54 year old female code stroke presentation with aphasia. Vague hyperdensity left temporal operculum on plain CT at 1926 hours today. EXAM: CT ANGIOGRAPHY HEAD AND NECK CT PERFUSION BRAIN TECHNIQUE: Multidetector CT imaging of the head and neck was performed using the standard protocol during bolus administration of intravenous contrast. Multiplanar CT image reconstructions and MIPs were obtained to evaluate the vascular anatomy. Carotid stenosis measurements (when applicable) are obtained utilizing NASCET criteria, using the distal internal carotid diameter as the denominator. Multiphase CT imaging of the brain was performed following IV bolus contrast injection. Subsequent parametric perfusion maps were calculated using RAPID software. CONTRAST:  17m OMNIPAQUE IOHEXOL 350 MG/ML SOLN COMPARISON:  plain CT at 1926 hours today. FINDINGS: CT Brain Perfusion Findings: ASPECTS: 10 (vague hyperdense area left operculum) CBF (<30%) Volume: None Perfusion (Tmax>6.0s) volume: None Mismatch Volume: None Infarction Location:None Other findings: Focally increased CBV and CBF in the same area of vague hyperdensity by plain CT. CTA NECK Skeleton: Chronic degenerative and/or posttraumatic changes to the right TMJ. Lower cervical disc, endplate and facet degeneration. No acute osseous abnormality identified. Degeneration Upper chest: Negative. Other neck: Thyroid size at the upper limits of  normal, no definite discrete thyroid nodule. No acute soft tissue findings identified in the neck. Aortic arch: 3 vessel arch configuration with no arch atherosclerosis. Right carotid system: Negative right CCA and right carotid bifurcation. There is a prominent beaded appearance of the distal cervical right ICA (series 9, image 15) in keeping with Fibromuscular Dysplasia (FMD). No stenosis. Left carotid system: Negative left CCA and left carotid bifurcation. Minimal if any luminal irregularity of the cervical left ICA and no stenosis. Vertebral arteries: Normal proximal right subclavian artery, right vertebral artery origin. The right vertebral appears mildly non dominant and is patent to the skull base without plaque or stenosis. Normal proximal left subclavian artery and left vertebral artery origin. The left vertebral is mildly dominant and patent to the skull base without plaque or stenosis. CTA HEAD Posterior circulation: Dominant left V4 segment. Patent PICA origins. Patent vertebrobasilar junction. Patent basilar artery. Fetal type right PCA origin. SCA and left PCA origins within normal limits. Left posterior communicating artery diminutive or absent. Bilateral PCA branches are within normal limits. Anterior circulation: Both ICA siphons are patent. On the left there is no plaque or stenosis. The right siphon also appears normal. Normal ophthalmic and right posterior communicating artery origins. Patent carotid termini. Normal MCA and ACA origins. Anterior communicating artery and bilateral ACA branches are within normal limits. Right MCA M1 segment and bifurcation are patent without stenosis. Right MCA branches are within normal limits. Left MCA M1 segment and bifurcation are also patent without stenosis. Left MCA branches appear within normal limits. But there are asymmetric vessels which seem to be an increase in cortical venous drainage about the left operculum (series 7, image 19). See also series 8,  image 19. There is no AVM nidus identified and no abnormally enlarged arterial supply to the region. Elsewhere intracranial vascularity appears symmetric and within normal limits. Venous sinuses: Patent. Anatomic variants: Fetal type right PCA origin. Review of the MIP images confirms the above findings IMPRESSION: 1. Negative for LVO and CTP detects no ischemia or infarct. 2. Positive for hyperemia in the left  frontal operculum corresponding to the area of vague hyperdensity by plain CT. No discrete AVM and no enhancement typical of meningioma identified. Other top considerations include sequelae of seizure and poorly enhancing tumor (such as a low to intermediate grade glioma). 3. Preliminary report of the above was discussed by telephone with Dr. Kerney Elbe on 05/30/2019 at 1956 hours. MRI is pending. 4. Additionally, there is cervical Right ICA Fibromuscular Dysplasia (FMD). But no atherosclerosis or stenosis in the head or neck. Electronically Signed   By: Genevie Ann M.D.   On: 05/30/2019 20:16   CT Code Stroke CTA Neck W/WO contrast  Result Date: 05/30/2019 CLINICAL DATA:  54 year old female code stroke presentation with aphasia. Vague hyperdensity left temporal operculum on plain CT at 1926 hours today. EXAM: CT ANGIOGRAPHY HEAD AND NECK CT PERFUSION BRAIN TECHNIQUE: Multidetector CT imaging of the head and neck was performed using the standard protocol during bolus administration of intravenous contrast. Multiplanar CT image reconstructions and MIPs were obtained to evaluate the vascular anatomy. Carotid stenosis measurements (when applicable) are obtained utilizing NASCET criteria, using the distal internal carotid diameter as the denominator. Multiphase CT imaging of the brain was performed following IV bolus contrast injection. Subsequent parametric perfusion maps were calculated using RAPID software. CONTRAST:  152m OMNIPAQUE IOHEXOL 350 MG/ML SOLN COMPARISON:  plain CT at 1926 hours today. FINDINGS:  CT Brain Perfusion Findings: ASPECTS: 10 (vague hyperdense area left operculum) CBF (<30%) Volume: None Perfusion (Tmax>6.0s) volume: None Mismatch Volume: None Infarction Location:None Other findings: Focally increased CBV and CBF in the same area of vague hyperdensity by plain CT. CTA NECK Skeleton: Chronic degenerative and/or posttraumatic changes to the right TMJ. Lower cervical disc, endplate and facet degeneration. No acute osseous abnormality identified. Degeneration Upper chest: Negative. Other neck: Thyroid size at the upper limits of normal, no definite discrete thyroid nodule. No acute soft tissue findings identified in the neck. Aortic arch: 3 vessel arch configuration with no arch atherosclerosis. Right carotid system: Negative right CCA and right carotid bifurcation. There is a prominent beaded appearance of the distal cervical right ICA (series 9, image 15) in keeping with Fibromuscular Dysplasia (FMD). No stenosis. Left carotid system: Negative left CCA and left carotid bifurcation. Minimal if any luminal irregularity of the cervical left ICA and no stenosis. Vertebral arteries: Normal proximal right subclavian artery, right vertebral artery origin. The right vertebral appears mildly non dominant and is patent to the skull base without plaque or stenosis. Normal proximal left subclavian artery and left vertebral artery origin. The left vertebral is mildly dominant and patent to the skull base without plaque or stenosis. CTA HEAD Posterior circulation: Dominant left V4 segment. Patent PICA origins. Patent vertebrobasilar junction. Patent basilar artery. Fetal type right PCA origin. SCA and left PCA origins within normal limits. Left posterior communicating artery diminutive or absent. Bilateral PCA branches are within normal limits. Anterior circulation: Both ICA siphons are patent. On the left there is no plaque or stenosis. The right siphon also appears normal. Normal ophthalmic and right posterior  communicating artery origins. Patent carotid termini. Normal MCA and ACA origins. Anterior communicating artery and bilateral ACA branches are within normal limits. Right MCA M1 segment and bifurcation are patent without stenosis. Right MCA branches are within normal limits. Left MCA M1 segment and bifurcation are also patent without stenosis. Left MCA branches appear within normal limits. But there are asymmetric vessels which seem to be an increase in cortical venous drainage about the left operculum (series 7, image  19). See also series 8, image 19. There is no AVM nidus identified and no abnormally enlarged arterial supply to the region. Elsewhere intracranial vascularity appears symmetric and within normal limits. Venous sinuses: Patent. Anatomic variants: Fetal type right PCA origin. Review of the MIP images confirms the above findings IMPRESSION: 1. Negative for LVO and CTP detects no ischemia or infarct. 2. Positive for hyperemia in the left frontal operculum corresponding to the area of vague hyperdensity by plain CT. No discrete AVM and no enhancement typical of meningioma identified. Other top considerations include sequelae of seizure and poorly enhancing tumor (such as a low to intermediate grade glioma). 3. Preliminary report of the above was discussed by telephone with Dr. Kerney Elbe on 05/30/2019 at 1956 hours. MRI is pending. 4. Additionally, there is cervical Right ICA Fibromuscular Dysplasia (FMD). But no atherosclerosis or stenosis in the head or neck. Electronically Signed   By: Genevie Ann M.D.   On: 05/30/2019 20:16   MR BRAIN W WO CONTRAST  Result Date: 05/30/2019 CLINICAL DATA:  54 year old female code stroke presentation with aphasia. Abnormal hyperemia of the left operculum on CTA/CTP. Vague increased hyperdensity there on plain CT. EXAM: MRI HEAD WITHOUT AND WITH CONTRAST TECHNIQUE: Multiplanar, multiecho pulse sequences of the brain and surrounding structures were obtained without and  with intravenous contrast. CONTRAST:  5.55m GADAVIST GADOBUTROL 1 MMOL/ML IV SOLN COMPARISON:  CT head, CTA and CTP earlier today. FINDINGS: Brain: Abnormal signal in the left insula and temporal operculum which is primarily limited to the left superior temporal gyrus. There is gyral expansion. See series 11, image 13 and series 17, image 15. On DWI there is gyriform increased trace signal but no convincing restricted diffusion, and some of the abnormal cortex is facilitated. No mineralization or blood products. The left deep gray nuclei appear spared, but similar abnormal gyriform signal and expansion tracks posteriorly through the left temporal lobe toward the lateral occipital lobe as far as coronal series 17, image 10 (FLAIR series 11, image 10), although this seems somewhat discontinuous with the epicenter. Following contrast, there is only trace crescentic enhancement which corresponds to the subcortical white matter of the affected superior temporal gyrus (series 20, image 21). No other abnormal enhancement. No leptomeningeal or dural thickening identified. There is trace rightward midline shift. No ventriculomegaly. No restricted diffusion to suggest acute infarction. No extra-axial collection or acute intracranial hemorrhage. Cervicomedullary junction and pituitary are within normal limits. Incidental anterior right frontal lobe developmental venous anomaly (normal variant). Negative pituitary and cervicomedullary junction. The contralateral right hemisphere and the brainstem appear normal. Vascular: Major intracranial vascular flow voids are preserved. No abnormal flow voids. The major dural venous sinuses are enhancing and appear to be patent. Skull and upper cervical spine: Heterogeneous bone marrow signal although no destructive osseous lesion identified. Sinuses/Orbits: Negative orbits. Paranasal Visualized paranasal sinuses and mastoids are stable and well pneumatized. Other: Visible internal auditory  structures appear normal. Scalp and face soft tissues appear negative. IMPRESSION: 1. Multifocal abnormally T2/FLAIR hyperintense and expanded gyri with epicenter at the left insula and operculum. Somewhat discontinuous involvement which tracks posteriorly toward the lateral left occiput. Only trace associated enhancement in the left superior temporal gyrus. No leptomeningeal or dural involvement. No diffusion restriction or hemorrhage. Mild intracranial mass effect, with trace rightward midline shift. 2. The top differential considerations are encephalitis (including an autoimmune encephalitis), an infiltrative primary tumor (including gliomatosis - although the elevated CBV/CBF may argue against a tumor with so little enhancement), and  postictal edema. Study discussed by telephone with Dr. Kerney Elbe on 05/30/2019 at 21:31. It seems that clinical signs and symptoms currently favor seizure. Currently he is planning EEG, LP, and a repeat MRI in one week. Electronically Signed   By: Genevie Ann M.D.   On: 05/30/2019 21:36   CT Code Stroke Cerebral Perfusion with contrast  Result Date: 05/30/2019 CLINICAL DATA:  54 year old female code stroke presentation with aphasia. Vague hyperdensity left temporal operculum on plain CT at 1926 hours today. EXAM: CT ANGIOGRAPHY HEAD AND NECK CT PERFUSION BRAIN TECHNIQUE: Multidetector CT imaging of the head and neck was performed using the standard protocol during bolus administration of intravenous contrast. Multiplanar CT image reconstructions and MIPs were obtained to evaluate the vascular anatomy. Carotid stenosis measurements (when applicable) are obtained utilizing NASCET criteria, using the distal internal carotid diameter as the denominator. Multiphase CT imaging of the brain was performed following IV bolus contrast injection. Subsequent parametric perfusion maps were calculated using RAPID software. CONTRAST:  172m OMNIPAQUE IOHEXOL 350 MG/ML SOLN COMPARISON:  plain CT  at 1926 hours today. FINDINGS: CT Brain Perfusion Findings: ASPECTS: 10 (vague hyperdense area left operculum) CBF (<30%) Volume: None Perfusion (Tmax>6.0s) volume: None Mismatch Volume: None Infarction Location:None Other findings: Focally increased CBV and CBF in the same area of vague hyperdensity by plain CT. CTA NECK Skeleton: Chronic degenerative and/or posttraumatic changes to the right TMJ. Lower cervical disc, endplate and facet degeneration. No acute osseous abnormality identified. Degeneration Upper chest: Negative. Other neck: Thyroid size at the upper limits of normal, no definite discrete thyroid nodule. No acute soft tissue findings identified in the neck. Aortic arch: 3 vessel arch configuration with no arch atherosclerosis. Right carotid system: Negative right CCA and right carotid bifurcation. There is a prominent beaded appearance of the distal cervical right ICA (series 9, image 15) in keeping with Fibromuscular Dysplasia (FMD). No stenosis. Left carotid system: Negative left CCA and left carotid bifurcation. Minimal if any luminal irregularity of the cervical left ICA and no stenosis. Vertebral arteries: Normal proximal right subclavian artery, right vertebral artery origin. The right vertebral appears mildly non dominant and is patent to the skull base without plaque or stenosis. Normal proximal left subclavian artery and left vertebral artery origin. The left vertebral is mildly dominant and patent to the skull base without plaque or stenosis. CTA HEAD Posterior circulation: Dominant left V4 segment. Patent PICA origins. Patent vertebrobasilar junction. Patent basilar artery. Fetal type right PCA origin. SCA and left PCA origins within normal limits. Left posterior communicating artery diminutive or absent. Bilateral PCA branches are within normal limits. Anterior circulation: Both ICA siphons are patent. On the left there is no plaque or stenosis. The right siphon also appears normal. Normal  ophthalmic and right posterior communicating artery origins. Patent carotid termini. Normal MCA and ACA origins. Anterior communicating artery and bilateral ACA branches are within normal limits. Right MCA M1 segment and bifurcation are patent without stenosis. Right MCA branches are within normal limits. Left MCA M1 segment and bifurcation are also patent without stenosis. Left MCA branches appear within normal limits. But there are asymmetric vessels which seem to be an increase in cortical venous drainage about the left operculum (series 7, image 19). See also series 8, image 19. There is no AVM nidus identified and no abnormally enlarged arterial supply to the region. Elsewhere intracranial vascularity appears symmetric and within normal limits. Venous sinuses: Patent. Anatomic variants: Fetal type right PCA origin. Review of the MIP images  the above findings IMPRESSION: 1. Negative for LVO and CTP detects no ischemia or infarct. 2. Positive for hyperemia in the left frontal operculum corresponding to the area of vague hyperdensity by plain CT. No discrete AVM and no enhancement typical of meningioma identified. Other top considerations include sequelae of seizure and poorly enhancing tumor (such as a low to intermediate grade glioma). 3. Preliminary report of the above was discussed by telephone with Dr. Kerney Elbe on 05/30/2019 at 1956 hours. MRI is pending. 4. Additionally, there is cervical Right ICA Fibromuscular Dysplasia (FMD). But no atherosclerosis or stenosis in the head or neck. Electronically Signed   By: Genevie Ann M.D.   On: 05/30/2019 20:16   CT HEAD CODE STROKE WO CONTRAST  Addendum Date: 05/30/2019   ADDENDUM REPORT: 05/30/2019 20:17 ADDENDUM: Study was discussed by telephone with Dr. Cheral Marker at 1942 hours. Electronically Signed   By: Genevie Ann M.D.   On: 05/30/2019 20:17   Result Date: 05/30/2019 CLINICAL DATA:  Code stroke. 54 year old female with right side tingling/paresthesia and  abnormal speech. EXAM: CT HEAD WITHOUT CONTRAST TECHNIQUE: Contiguous axial images were obtained from the base of the skull through the vertex without intravenous contrast. COMPARISON:  None. FINDINGS: Brain: Normal cerebral volume. No midline shift and basilar cisterns are patent. But there is a vague asymmetric area of mild hyperdensity associated with the left superior temporal gyrus or temporal operculum on series 3, image 11. This measures about 20 mm. No regional cerebral edema. Otherwise gray-white matter differentiation appears symmetric and within normal limits. No ventriculomegaly. No changes of acute cortically based infarct identified. Vascular: No suspicious intracranial vascular hyperdensity. Skull: Negative. Sinuses/Orbits: Visualized paranasal sinuses and mastoids are clear. Other: Visualized orbit soft tissues are within normal limits. Visualized scalp soft tissues are within normal limits. ASPECTS [facility] Stroke Program Early CT Score) Total score (0-10 with 10 being normal): 10 IMPRESSION: 1. Vague asymmetric hyperdensity in a 20 mm area of the left temporal operculum. This is nonspecific but might be a meningioma. Acute intracranial hemorrhage is difficult to exclude. MRI without and with contrast would best characterize further when feasible. 2. No superimposed CT changes of acute cortically based infarct identified. ASPECTS 10. These results were communicated to Dr. Cheral Marker at 7:32 pm on 05/30/2019 by text page via the [facility] messaging system. Electronically Signed: By: Genevie Ann M.D. On: 05/30/2019 19:33    Procedures .Lumbar Puncture  Date/Time: 05/30/2019 11:55 PM Performed by: Isla Pence, MD Authorized by: Isla Pence, MD   Consent:    Consent obtained:  Verbal   Consent given by:  Patient   Risks discussed:  Pain, bleeding and infection   Alternatives discussed:  No treatment Pre-procedure details:    Procedure purpose:  Diagnostic Anesthesia (see MAR for exact  dosages):    Anesthesia method:  Local infiltration   Local anesthetic:  Lidocaine 1% w/o epi Procedure details:    Lumbar space:  L4-L5 interspace   Patient position:  Sitting   Needle gauge:  22   Needle type:  Spinal needle - Quincke tip   Needle length (in):  3.5   Ultrasound guidance: no     Number of attempts:  1   Fluid appearance:  Clear   Tubes of fluid:  4   Total volume (ml):  5 Post-procedure:    Puncture site:  Adhesive bandage applied   Patient tolerance of procedure:  Tolerated well, no immediate complications   (including critical care time)  Medications Ordered in  in ED Medications  levETIRAcetam (KEPPRA) IVPB 1000 mg/100 mL premix (has no administration in time range)  sodium chloride 0.9 % bolus 1,000 mL (has no administration in time range)  iohexol (OMNIPAQUE) 350 MG/ML injection 100 mL (100 mLs Intravenous Contrast Given 05/30/19 1943)  gadobutrol (GADAVIST) 1 MMOL/ML injection 5.5 mL (5.5 mLs Intravenous Contrast Given 05/30/19 2045)  levETIRAcetam (KEPPRA) 2,000 mg in sodium chloride 0.9 % 250 mL IVPB (2,000 mg Intravenous New Bag/Given 05/30/19 2208)    ED Course  I have reviewed the triage vital signs and the nursing notes.  Pertinent labs & imaging results that were available during my care of the patient were reviewed by me and considered in my medical decision making (see chart for details).  Code stroke called upon arrival to ED.  Stroke team met pt in the CT scanner.  Pt did have an abnormal finding on CT head w/o, so she had several other scans.  No CVA.  No tpa.  Pt does have a focus of seizure on MRI.  Dr. Cheral Marker recommended LP, so that was done.  He ordered keppra and an EEG was done.  Pt d/w Dr. Myna Hidalgo for admission.   MDM Rules/Calculators/A&P                      CRITICAL CARE Performed by: Isla Pence   Total critical care time: 45 minutes  Critical care time was exclusive of separately billable procedures and treating other  patients.  Critical care was necessary to treat or prevent imminent or life-threatening deterioration.  Critical care was time spent personally by me on the following activities: development of treatment plan with patient and/or surrogate as well as nursing, discussions with consultants, evaluation of patient's response to treatment, examination of patient, obtaining history from patient or surrogate, ordering and performing treatments and interventions, ordering and review of laboratory studies, ordering and review of radiographic studies, pulse oximetry and re-evaluation of patient's condition.  Final Clinical Impression(s) / ED Diagnoses Final diagnoses:  Seizure [facility])    Rx / [place] Orders ED Discharge Orders    None       Isla Pence, MD 05/30/19 2357
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
## 2019-05-30 NOTE — ED Notes (Signed)
Pt jewelry given to [place]. He was also updated on patient status and plan of care at this time.

## 2019-05-30 NOTE — H&P (Signed)
History and Physical    Kristen Foley [id number] DOB: 06/23/1965 DOA: 05/30/2019  PCP: Unk Pinto, MD   Patient coming from: Home   Chief Complaint: Right leg tingling, speech disturbance   HPI: Kristen Foley is a 54 y.o. female with no known significant past medical history, now presenting to the emergency department for evaluation of paresthesias involving the right side, speech disturbance, and transient alteration in awareness.  Patient had reportedly been in her usual state until around 4 PM when she notified her husband of a tingling sensation involving the right foot and later the remaining right leg and right torso.  She went on to have an episode where she seemed to stare off and not answer questions or respond for several seconds.  This was followed by half normal speech with word substitution that initially waxed and waned but began to worsen and become more persistent.  Patient had never exhibited these type of symptoms previously, there is no history of recent trauma, and no recent fevers reported.  ED Course: Upon arrival to the ED, patient is found to be afebrile, saturating well on room air, tachycardic in the 110s, and with stable blood pressure.  EKG features sinus tachycardia with rate 109.  Noncontrast head CT is notable for nonspecific vague asymmetric hypodensity involving the left temporal operculum.  CT perfusion study is negative and CTA head and neck notable for hyperemia involving the left frontal operculum and cervical right ICA fibromuscular dysplasia.  MRI brain has been performed with and without contrast with read pending.  Chemistry panel and CBC are unremarkable, UDS is negative, and ethanol undetectable.  Neurology was consulted by the ED physician, IV Keppra has been started, EEG is underway, and lumbar puncture for CSF analysis has been recommended following the EEG.  Review of Systems:  All other systems reviewed and apart from HPI, are  negative.  Past Medical History:  Diagnosis Date  . Allergy   . Anemia     Past Surgical History:  Procedure Laterality Date  . MANDIBLE RECONSTRUCTION  1986     reports that she has never smoked. She has never used smokeless tobacco. No history on file for alcohol and drug.  Allergies  Allergen Reactions  . Lexapro [Escitalopram Oxalate] Other (See Comments)    "it was too strong for me"  . Wellbutrin [Bupropion] Other (See Comments)    "it was too strong for me"  . Zoloft [Sertraline Hcl] Other (See Comments)    "it was too strong for me"    No family history on file.   Prior to Admission medications   Medication Sig Start Date End Date Taking? Authorizing Provider  acetaminophen (TYLENOL) 325 MG tablet Take 325-650 mg by mouth every 6 (six) hours as needed for mild pain or headache.   Yes [provider]  Ascorbic Acid (VITAMIN C) 500 MG CAPS Take 500-1,000 mg by mouth every other day.   Yes [provider]  aspirin 81 MG tablet Take 81 mg by mouth 2 (two) times a week.    Yes [provider]  Cholecalciferol (VITAMIN D3) 125 MCG (5000 UT) CAPS Take 10,000 Units by mouth daily.   Yes [provider]  ferrous sulfate 325 (65 FE) MG EC tablet Take 325 mg by mouth 3 (three) times a week.   Yes [provider]  ibuprofen (ADVIL) 200 MG tablet Take 200-400 mg by mouth every 6 (six) hours as needed for headache or mild pain.  Yes [provider]  KRILL OIL PO Take 1 capsule by mouth 3 (three) times a week.    Yes [provider]  Magnesium 500 MG TABS Take 500 mg by mouth 2 (two) times a week.    Yes [provider]    Physical Exam: Vitals:   05/30/19 2145 05/30/19 2200 05/30/19 2230 05/30/19 2300  BP: (!) 151/84 (!) 160/86 131/74 (!) 142/80  Pulse: 99 (!) 103 91 98  Resp: 11 (!) 8 11 17   Temp:      TempSrc:      SpO2: 99% 99% 99% 98%  Weight:      Height:         Constitutional: No  respiratory distress, calm   Eyes: PERTLA, lids and conjunctivae normal ENMT: Mucous membranes are moist. Posterior pharynx clear of any exudate or lesions.   Neck: normal, supple, no masses, no thyromegaly Respiratory: clear to auscultation bilaterally, no wheezing, no crackles. No accessory muscle use.  Cardiovascular: S1 & S2 heard, regular rate and rhythm. No extremity edema. Abdomen: No distension, no tenderness, soft. Bowel sounds active.  Musculoskeletal: no clubbing / cyanosis. No joint deformity upper and lower extremities.   Skin: no significant rashes, lesions, ulcers. Warm, dry, well-perfused. Neurologic: CN 2-12 grossly intact. Sensation intact, patellar DTR normal. Strength 5/5 in all 4 limbs.  Psychiatric: Alert, oriented to person and place, looks to husband to answer all other questions. Calm and cooperative.    Labs and Imaging on Admission: I have personally reviewed following labs and imaging studies  CBC: Recent Labs  Lab 05/30/19 1906 05/30/19 1929  WBC 8.1  --   NEUTROABS 6.4  --   HGB 14.2 15.0  HCT 44.3 44.0  MCV 97.6  --   PLT 244  --    Basic Metabolic Panel: Recent Labs  Lab 05/30/19 1906 05/30/19 1929  NA 138 139  K 3.7 3.6  CL 102 103  CO2 23  --   GLUCOSE 120* 113*  BUN 14 15  CREATININE 0.85 0.60  CALCIUM 9.6  --    GFR: Estimated Creatinine Clearance: 68.8 mL/min (by C-G formula based on SCr of 0.6 mg/dL). Liver Function Tests: Recent Labs  Lab 05/30/19 1906  AST 26  ALT 17  ALKPHOS 54  BILITOT 0.6  PROT 7.4  ALBUMIN 4.4   No results for input(s): LIPASE, AMYLASE in the last 168 hours. No results for input(s): AMMONIA in the last 168 hours. Coagulation Profile: Recent Labs  Lab 05/30/19 1906  INR 1.1   Cardiac Enzymes: No results for input(s): CKTOTAL, CKMB, CKMBINDEX, TROPONINI in the last 168 hours. BNP (last 3 results) No results for input(s): PROBNP in the last 8760 hours. HbA1C: No results for input(s): HGBA1C in  the last 72 hours. CBG: Recent Labs  Lab 05/30/19 1928  GLUCAP 110*   Lipid Profile: No results for input(s): CHOL, HDL, LDLCALC, TRIG, CHOLHDL, LDLDIRECT in the last 72 hours. Thyroid Function Tests: No results for input(s): TSH, T4TOTAL, FREET4, T3FREE, THYROIDAB in the last 72 hours. Anemia Panel: No results for input(s): VITAMINB12, FOLATE, FERRITIN, TIBC, IRON, RETICCTPCT in the last 72 hours. Urine analysis:    Component Value Date/Time   COLORURINE YELLOW 05/30/2019 1918   APPEARANCEUR CLEAR 05/30/2019 1918   LABSPEC <1.005 (L) 05/30/2019 1918   PHURINE 5.0 05/30/2019 1918   GLUCOSEU NEGATIVE 05/30/2019 1918   HGBUR TRACE (A) 05/30/2019 1918   BILIRUBINUR NEGATIVE 05/30/2019 1918   KETONESUR 15 (A) 05/30/2019  1918   PROTEINUR NEGATIVE 05/30/2019 1918   NITRITE NEGATIVE 05/30/2019 1918   LEUKOCYTESUR NEGATIVE 05/30/2019 1918   Sepsis Labs: @LABRCNTIP (procalcitonin:4,lacticidven:4) )No results found for this or any previous visit (from the past 240 hour(s)).   Radiological Exams on Admission: CT Code Stroke CTA Head W/WO contrast  Result Date: 05/30/2019 CLINICAL DATA:  54 year old female code stroke presentation with aphasia. Vague hyperdensity left temporal operculum on plain CT at 1926 hours today. EXAM: CT ANGIOGRAPHY HEAD AND NECK CT PERFUSION BRAIN TECHNIQUE: Multidetector CT imaging of the head and neck was performed using the standard protocol during bolus administration of intravenous contrast. Multiplanar CT image reconstructions and MIPs were obtained to evaluate the vascular anatomy. Carotid stenosis measurements (when applicable) are obtained utilizing NASCET criteria, using the distal internal carotid diameter as the denominator. Multiphase CT imaging of the brain was performed following IV bolus contrast injection. Subsequent parametric perfusion maps were calculated using RAPID software. CONTRAST:  14mL OMNIPAQUE IOHEXOL 350 MG/ML SOLN COMPARISON:  plain CT at  1926 hours today. FINDINGS: CT Brain Perfusion Findings: ASPECTS: 10 (vague hyperdense area left operculum) CBF (<30%) Volume: None Perfusion (Tmax>6.0s) volume: None Mismatch Volume: None Infarction Location:None Other findings: Focally increased CBV and CBF in the same area of vague hyperdensity by plain CT. CTA NECK Skeleton: Chronic degenerative and/or posttraumatic changes to the right TMJ. Lower cervical disc, endplate and facet degeneration. No acute osseous abnormality identified. Degeneration Upper chest: Negative. Other neck: Thyroid size at the upper limits of normal, no definite discrete thyroid nodule. No acute soft tissue findings identified in the neck. Aortic arch: 3 vessel arch configuration with no arch atherosclerosis. Right carotid system: Negative right CCA and right carotid bifurcation. There is a prominent beaded appearance of the distal cervical right ICA (series 9, image 15) in keeping with Fibromuscular Dysplasia (FMD). No stenosis. Left carotid system: Negative left CCA and left carotid bifurcation. Minimal if any luminal irregularity of the cervical left ICA and no stenosis. Vertebral arteries: Normal proximal right subclavian artery, right vertebral artery origin. The right vertebral appears mildly non dominant and is patent to the skull base without plaque or stenosis. Normal proximal left subclavian artery and left vertebral artery origin. The left vertebral is mildly dominant and patent to the skull base without plaque or stenosis. CTA HEAD Posterior circulation: Dominant left V4 segment. Patent PICA origins. Patent vertebrobasilar junction. Patent basilar artery. Fetal type right PCA origin. SCA and left PCA origins within normal limits. Left posterior communicating artery diminutive or absent. Bilateral PCA branches are within normal limits. Anterior circulation: Both ICA siphons are patent. On the left there is no plaque or stenosis. The right siphon also appears normal. Normal  ophthalmic and right posterior communicating artery origins. Patent carotid termini. Normal MCA and ACA origins. Anterior communicating artery and bilateral ACA branches are within normal limits. Right MCA M1 segment and bifurcation are patent without stenosis. Right MCA branches are within normal limits. Left MCA M1 segment and bifurcation are also patent without stenosis. Left MCA branches appear within normal limits. But there are asymmetric vessels which seem to be an increase in cortical venous drainage about the left operculum (series 7, image 19). See also series 8, image 19. There is no AVM nidus identified and no abnormally enlarged arterial supply to the region. Elsewhere intracranial vascularity appears symmetric and within normal limits. Venous sinuses: Patent. Anatomic variants: Fetal type right PCA origin. Review of the MIP images confirms the above findings IMPRESSION: 1. Negative for LVO  and CTP detects no ischemia or infarct. 2. Positive for hyperemia in the left frontal operculum corresponding to the area of vague hyperdensity by plain CT. No discrete AVM and no enhancement typical of meningioma identified. Other top considerations include sequelae of seizure and poorly enhancing tumor (such as a low to intermediate grade glioma). 3. Preliminary report of the above was discussed by telephone with Dr. Kerney Elbe on 05/30/2019 at 1956 hours. MRI is pending. 4. Additionally, there is cervical Right ICA Fibromuscular Dysplasia (FMD). But no atherosclerosis or stenosis in the head or neck. Electronically Signed   By: Genevie Ann M.D.   On: 05/30/2019 20:16   CT Code Stroke CTA Neck W/WO contrast  Result Date: 05/30/2019 CLINICAL DATA:  54 year old female code stroke presentation with aphasia. Vague hyperdensity left temporal operculum on plain CT at 1926 hours today. EXAM: CT ANGIOGRAPHY HEAD AND NECK CT PERFUSION BRAIN TECHNIQUE: Multidetector CT imaging of the head and neck was performed using the  standard protocol during bolus administration of intravenous contrast. Multiplanar CT image reconstructions and MIPs were obtained to evaluate the vascular anatomy. Carotid stenosis measurements (when applicable) are obtained utilizing NASCET criteria, using the distal internal carotid diameter as the denominator. Multiphase CT imaging of the brain was performed following IV bolus contrast injection. Subsequent parametric perfusion maps were calculated using RAPID software. CONTRAST:  125mL OMNIPAQUE IOHEXOL 350 MG/ML SOLN COMPARISON:  plain CT at 1926 hours today. FINDINGS: CT Brain Perfusion Findings: ASPECTS: 10 (vague hyperdense area left operculum) CBF (<30%) Volume: None Perfusion (Tmax>6.0s) volume: None Mismatch Volume: None Infarction Location:None Other findings: Focally increased CBV and CBF in the same area of vague hyperdensity by plain CT. CTA NECK Skeleton: Chronic degenerative and/or posttraumatic changes to the right TMJ. Lower cervical disc, endplate and facet degeneration. No acute osseous abnormality identified. Degeneration Upper chest: Negative. Other neck: Thyroid size at the upper limits of normal, no definite discrete thyroid nodule. No acute soft tissue findings identified in the neck. Aortic arch: 3 vessel arch configuration with no arch atherosclerosis. Right carotid system: Negative right CCA and right carotid bifurcation. There is a prominent beaded appearance of the distal cervical right ICA (series 9, image 15) in keeping with Fibromuscular Dysplasia (FMD). No stenosis. Left carotid system: Negative left CCA and left carotid bifurcation. Minimal if any luminal irregularity of the cervical left ICA and no stenosis. Vertebral arteries: Normal proximal right subclavian artery, right vertebral artery origin. The right vertebral appears mildly non dominant and is patent to the skull base without plaque or stenosis. Normal proximal left subclavian artery and left vertebral artery origin.  The left vertebral is mildly dominant and patent to the skull base without plaque or stenosis. CTA HEAD Posterior circulation: Dominant left V4 segment. Patent PICA origins. Patent vertebrobasilar junction. Patent basilar artery. Fetal type right PCA origin. SCA and left PCA origins within normal limits. Left posterior communicating artery diminutive or absent. Bilateral PCA branches are within normal limits. Anterior circulation: Both ICA siphons are patent. On the left there is no plaque or stenosis. The right siphon also appears normal. Normal ophthalmic and right posterior communicating artery origins. Patent carotid termini. Normal MCA and ACA origins. Anterior communicating artery and bilateral ACA branches are within normal limits. Right MCA M1 segment and bifurcation are patent without stenosis. Right MCA branches are within normal limits. Left MCA M1 segment and bifurcation are also patent without stenosis. Left MCA branches appear within normal limits. But there are asymmetric vessels which seem to  be an increase in cortical venous drainage about the left operculum (series 7, image 19). See also series 8, image 19. There is no AVM nidus identified and no abnormally enlarged arterial supply to the region. Elsewhere intracranial vascularity appears symmetric and within normal limits. Venous sinuses: Patent. Anatomic variants: Fetal type right PCA origin. Review of the MIP images confirms the above findings IMPRESSION: 1. Negative for LVO and CTP detects no ischemia or infarct. 2. Positive for hyperemia in the left frontal operculum corresponding to the area of vague hyperdensity by plain CT. No discrete AVM and no enhancement typical of meningioma identified. Other top considerations include sequelae of seizure and poorly enhancing tumor (such as a low to intermediate grade glioma). 3. Preliminary report of the above was discussed by telephone with Dr. Kerney Elbe on 05/30/2019 at 1956 hours. MRI is pending.  4. Additionally, there is cervical Right ICA Fibromuscular Dysplasia (FMD). But no atherosclerosis or stenosis in the head or neck. Electronically Signed   By: Genevie Ann M.D.   On: 05/30/2019 20:16   CT Code Stroke Cerebral Perfusion with contrast  Result Date: 05/30/2019 CLINICAL DATA:  54 year old female code stroke presentation with aphasia. Vague hyperdensity left temporal operculum on plain CT at 1926 hours today. EXAM: CT ANGIOGRAPHY HEAD AND NECK CT PERFUSION BRAIN TECHNIQUE: Multidetector CT imaging of the head and neck was performed using the standard protocol during bolus administration of intravenous contrast. Multiplanar CT image reconstructions and MIPs were obtained to evaluate the vascular anatomy. Carotid stenosis measurements (when applicable) are obtained utilizing NASCET criteria, using the distal internal carotid diameter as the denominator. Multiphase CT imaging of the brain was performed following IV bolus contrast injection. Subsequent parametric perfusion maps were calculated using RAPID software. CONTRAST:  126mL OMNIPAQUE IOHEXOL 350 MG/ML SOLN COMPARISON:  plain CT at 1926 hours today. FINDINGS: CT Brain Perfusion Findings: ASPECTS: 10 (vague hyperdense area left operculum) CBF (<30%) Volume: None Perfusion (Tmax>6.0s) volume: None Mismatch Volume: None Infarction Location:None Other findings: Focally increased CBV and CBF in the same area of vague hyperdensity by plain CT. CTA NECK Skeleton: Chronic degenerative and/or posttraumatic changes to the right TMJ. Lower cervical disc, endplate and facet degeneration. No acute osseous abnormality identified. Degeneration Upper chest: Negative. Other neck: Thyroid size at the upper limits of normal, no definite discrete thyroid nodule. No acute soft tissue findings identified in the neck. Aortic arch: 3 vessel arch configuration with no arch atherosclerosis. Right carotid system: Negative right CCA and right carotid bifurcation. There is a  prominent beaded appearance of the distal cervical right ICA (series 9, image 15) in keeping with Fibromuscular Dysplasia (FMD). No stenosis. Left carotid system: Negative left CCA and left carotid bifurcation. Minimal if any luminal irregularity of the cervical left ICA and no stenosis. Vertebral arteries: Normal proximal right subclavian artery, right vertebral artery origin. The right vertebral appears mildly non dominant and is patent to the skull base without plaque or stenosis. Normal proximal left subclavian artery and left vertebral artery origin. The left vertebral is mildly dominant and patent to the skull base without plaque or stenosis. CTA HEAD Posterior circulation: Dominant left V4 segment. Patent PICA origins. Patent vertebrobasilar junction. Patent basilar artery. Fetal type right PCA origin. SCA and left PCA origins within normal limits. Left posterior communicating artery diminutive or absent. Bilateral PCA branches are within normal limits. Anterior circulation: Both ICA siphons are patent. On the left there is no plaque or stenosis. The right siphon also appears normal. Normal  ophthalmic and right posterior communicating artery origins. Patent carotid termini. Normal MCA and ACA origins. Anterior communicating artery and bilateral ACA branches are within normal limits. Right MCA M1 segment and bifurcation are patent without stenosis. Right MCA branches are within normal limits. Left MCA M1 segment and bifurcation are also patent without stenosis. Left MCA branches appear within normal limits. But there are asymmetric vessels which seem to be an increase in cortical venous drainage about the left operculum (series 7, image 19). See also series 8, image 19. There is no AVM nidus identified and no abnormally enlarged arterial supply to the region. Elsewhere intracranial vascularity appears symmetric and within normal limits. Venous sinuses: Patent. Anatomic variants: Fetal type right PCA origin.  Review of the MIP images confirms the above findings IMPRESSION: 1. Negative for LVO and CTP detects no ischemia or infarct. 2. Positive for hyperemia in the left frontal operculum corresponding to the area of vague hyperdensity by plain CT. No discrete AVM and no enhancement typical of meningioma identified. Other top considerations include sequelae of seizure and poorly enhancing tumor (such as a low to intermediate grade glioma). 3. Preliminary report of the above was discussed by telephone with Dr. Kerney Elbe on 05/30/2019 at 1956 hours. MRI is pending. 4. Additionally, there is cervical Right ICA Fibromuscular Dysplasia (FMD). But no atherosclerosis or stenosis in the head or neck. Electronically Signed   By: Genevie Ann M.D.   On: 05/30/2019 20:16   CT HEAD CODE STROKE WO CONTRAST  Result Date: 05/30/2019 CLINICAL DATA:  Code stroke. 54 year old female with right side tingling/paresthesia and abnormal speech. EXAM: CT HEAD WITHOUT CONTRAST TECHNIQUE: Contiguous axial images were obtained from the base of the skull through the vertex without intravenous contrast. COMPARISON:  None. FINDINGS: Brain: Normal cerebral volume. No midline shift and basilar cisterns are patent. But there is a vague asymmetric area of mild hyperdensity associated with the left superior temporal gyrus or temporal operculum on series 3, image 11. This measures about 20 mm. No regional cerebral edema. Otherwise gray-white matter differentiation appears symmetric and within normal limits. No ventriculomegaly. No changes of acute cortically based infarct identified. Vascular: No suspicious intracranial vascular hyperdensity. Skull: Negative. Sinuses/Orbits: Visualized paranasal sinuses and mastoids are clear. Other: Visualized orbit soft tissues are within normal limits. Visualized scalp soft tissues are within normal limits. ASPECTS [facility] Stroke Program Early CT Score) Total score (0-10 with 10 being normal): 10 IMPRESSION: 1. Vague  asymmetric hyperdensity in a 20 mm area of the left temporal operculum. This is nonspecific but might be a meningioma. Acute intracranial hemorrhage is difficult to exclude. MRI without and with contrast would best characterize further when feasible. 2. No superimposed CT changes of acute cortically based infarct identified. ASPECTS 10. These results were communicated to Dr. Cheral Marker at 7:32 pm on 05/30/2019 by text page via the [facility] messaging system. Electronically Signed   By: Genevie Ann M.D.   On: 05/30/2019 19:33    EKG: Independently reviewed. Sinus tachycardia, rate 109.   Assessment/Plan   1. Right-sided paraesthesias, speech disturbance, alteration in awareness  - Pt presents with acute-onset of multiple neurologic signs and symptoms beginning with right-sided paraesthesias, then speech difficulty, and later transient alteration in awareness, never fully recovering  - Neurology is consulting and much appreciated  - MRI brain and EEG have been performed with reads pending and ED physician preparing for LP for CSF analysis  - She will be continued on Keppra and supportive care pending further workup  DVT prophylaxis: SCD's  Code Status: Full  Family Communication: Husband updated at bedside  Disposition Plan: Likely back home once inpatient neurologic workup complete Consults called: Neurology  Admission status: observation     Vianne Bulls, MD [facility] Pager: See [email or web address]  If 7AM-7PM, please contact the daytime attending [email or web address]  05/30/2019, 11:11 PM

## 2019-05-30 NOTE — ED Triage Notes (Signed)
Pt presents to the ED with intermittent tingling from R toes to waist, some minor sensation in R hand. Pt appears to have slowed comprehension and some words are garbled. Tingling began today @ 1600. No facial droop noted. LVO neg

## 2019-05-30 NOTE — Progress Notes (Signed)
Stat EEG completed, results pending. Dr Cheral Marker and Dr Hortense Ramal reviewed.

## 2019-05-30 NOTE — ED Notes (Signed)
MRI notified of STAT order

## 2019-05-30 NOTE — ED Notes (Signed)
Add on for Magnesium with Shanon Brow at the lab

## 2019-05-30 NOTE — ED Notes (Signed)
Transported to MRI

## 2019-05-30 NOTE — ED Notes (Signed)
Called [facility] for Code Stroke activation @ 7:17 pm

## 2019-05-31 ENCOUNTER — Encounter (HOSPITAL_COMMUNITY): Payer: Self-pay | Admitting: Family Medicine

## 2019-05-31 ENCOUNTER — Inpatient Hospital Stay (HOSPITAL_COMMUNITY): Payer: 59

## 2019-05-31 ENCOUNTER — Other Ambulatory Visit: Payer: Self-pay

## 2019-05-31 DIAGNOSIS — R4701 Aphasia: Secondary | ICD-10-CM | POA: Diagnosis present

## 2019-05-31 DIAGNOSIS — G0481 Other encephalitis and encephalomyelitis: Secondary | ICD-10-CM | POA: Diagnosis present

## 2019-05-31 DIAGNOSIS — R569 Unspecified convulsions: Secondary | ICD-10-CM

## 2019-05-31 DIAGNOSIS — Z888 Allergy status to other drugs, medicaments and biological substances status: Secondary | ICD-10-CM | POA: Diagnosis not present

## 2019-05-31 DIAGNOSIS — N83202 Unspecified ovarian cyst, left side: Secondary | ICD-10-CM | POA: Diagnosis present

## 2019-05-31 DIAGNOSIS — R404 Transient alteration of awareness: Secondary | ICD-10-CM | POA: Diagnosis present

## 2019-05-31 DIAGNOSIS — Z791 Long term (current) use of non-steroidal anti-inflammatories (NSAID): Secondary | ICD-10-CM | POA: Diagnosis not present

## 2019-05-31 DIAGNOSIS — E8809 Other disorders of plasma-protein metabolism, not elsewhere classified: Secondary | ICD-10-CM | POA: Diagnosis present

## 2019-05-31 DIAGNOSIS — D496 Neoplasm of unspecified behavior of brain: Secondary | ICD-10-CM | POA: Diagnosis present

## 2019-05-31 DIAGNOSIS — A6920 Lyme disease, unspecified: Secondary | ICD-10-CM | POA: Diagnosis present

## 2019-05-31 DIAGNOSIS — R4702 Dysphasia: Secondary | ICD-10-CM | POA: Diagnosis present

## 2019-05-31 DIAGNOSIS — Z7982 Long term (current) use of aspirin: Secondary | ICD-10-CM | POA: Diagnosis not present

## 2019-05-31 DIAGNOSIS — Z79899 Other long term (current) drug therapy: Secondary | ICD-10-CM | POA: Diagnosis not present

## 2019-05-31 DIAGNOSIS — G40A01 Absence epileptic syndrome, not intractable, with status epilepticus: Secondary | ICD-10-CM | POA: Diagnosis present

## 2019-05-31 DIAGNOSIS — R200 Anesthesia of skin: Secondary | ICD-10-CM | POA: Diagnosis not present

## 2019-05-31 DIAGNOSIS — G40901 Epilepsy, unspecified, not intractable, with status epilepticus: Secondary | ICD-10-CM

## 2019-05-31 DIAGNOSIS — Z20822 Contact with and (suspected) exposure to covid-19: Secondary | ICD-10-CM | POA: Diagnosis present

## 2019-05-31 DIAGNOSIS — E785 Hyperlipidemia, unspecified: Secondary | ICD-10-CM | POA: Diagnosis present

## 2019-05-31 DIAGNOSIS — R918 Other nonspecific abnormal finding of lung field: Secondary | ICD-10-CM | POA: Diagnosis present

## 2019-05-31 DIAGNOSIS — Z8249 Family history of ischemic heart disease and other diseases of the circulatory system: Secondary | ICD-10-CM | POA: Diagnosis not present

## 2019-05-31 DIAGNOSIS — R202 Paresthesia of skin: Secondary | ICD-10-CM | POA: Diagnosis not present

## 2019-05-31 LAB — CBC WITH DIFFERENTIAL/PLATELET
Abs Immature Granulocytes: 0.03 10*3/uL (ref 0.00–0.07)
Basophils Absolute: 0 10*3/uL (ref 0.0–0.1)
Basophils Relative: 0 %
Eosinophils Absolute: 0 10*3/uL (ref 0.0–0.5)
Eosinophils Relative: 0 %
HCT: 37.6 % (ref 36.0–46.0)
Hemoglobin: 12.2 g/dL (ref 12.0–15.0)
Immature Granulocytes: 0 %
Lymphocytes Relative: 17 %
Lymphs Abs: 1.4 10*3/uL (ref 0.7–4.0)
MCH: 31.9 pg (ref 26.0–34.0)
MCHC: 32.4 g/dL (ref 30.0–36.0)
MCV: 98.4 fL (ref 80.0–100.0)
Monocytes Absolute: 0.8 10*3/uL (ref 0.1–1.0)
Monocytes Relative: 9 %
Neutro Abs: 6.1 10*3/uL (ref 1.7–7.7)
Neutrophils Relative %: 74 %
Platelets: 209 10*3/uL (ref 150–400)
RBC: 3.82 MIL/uL — ABNORMAL LOW (ref 3.87–5.11)
RDW: 12 % (ref 11.5–15.5)
WBC: 8.3 10*3/uL (ref 4.0–10.5)
nRBC: 0 % (ref 0.0–0.2)

## 2019-05-31 LAB — SARS CORONAVIRUS 2 (TAT 6-24 HRS): SARS Coronavirus 2: NEGATIVE

## 2019-05-31 LAB — CRYPTOCOCCAL ANTIGEN, CSF: Crypto Ag: NEGATIVE

## 2019-05-31 LAB — CSF CELL COUNT WITH DIFFERENTIAL
RBC Count, CSF: 104 /mm3 — ABNORMAL HIGH
RBC Count, CSF: 1637 /mm3 — ABNORMAL HIGH
Tube #: 1
Tube #: 4
WBC, CSF: 1 /mm3 (ref 0–5)
WBC, CSF: 3 /mm3 (ref 0–5)

## 2019-05-31 LAB — COMPREHENSIVE METABOLIC PANEL
ALT: 13 U/L (ref 0–44)
AST: 18 U/L (ref 15–41)
Albumin: 3.3 g/dL — ABNORMAL LOW (ref 3.5–5.0)
Alkaline Phosphatase: 42 U/L (ref 38–126)
Anion gap: 10 (ref 5–15)
BUN: 8 mg/dL (ref 6–20)
CO2: 23 mmol/L (ref 22–32)
Calcium: 8.3 mg/dL — ABNORMAL LOW (ref 8.9–10.3)
Chloride: 109 mmol/L (ref 98–111)
Creatinine, Ser: 0.63 mg/dL (ref 0.44–1.00)
GFR calc Af Amer: >60 mL/min (ref >60)
GFR calc non Af Amer: >60 mL/min (ref >60)
Glucose, Bld: 99 mg/dL (ref 70–99)
Potassium: 3.7 mmol/L (ref 3.5–5.1)
Sodium: 142 mmol/L (ref 135–145)
Total Bilirubin: 1 mg/dL (ref 0.3–1.2)
Total Protein: 5.6 g/dL — ABNORMAL LOW (ref 6.5–8.1)

## 2019-05-31 LAB — PROTEIN, CSF: Total  Protein, CSF: 30 mg/dL (ref 15–45)

## 2019-05-31 LAB — GLUCOSE, CSF: Glucose, CSF: 67 mg/dL (ref 40–70)

## 2019-05-31 LAB — HIV ANTIBODY (ROUTINE TESTING W REFLEX): HIV Screen 4th Generation wRfx: NONREACTIVE

## 2019-05-31 MED ORDER — SODIUM CHLORIDE 0.9% FLUSH
3.0000 mL | Freq: Two times a day (BID) | INTRAVENOUS | Status: DC
  Administered 2019-05-31 – 2019-06-03 (×7): 3 mL via INTRAVENOUS

## 2019-05-31 MED ORDER — ACETAMINOPHEN 325 MG PO TABS: 650.0000 mg | ORAL_TABLET | Freq: Four times a day (QID) | ORAL | Status: DC | PRN

## 2019-05-31 MED ORDER — MAGNESIUM SULFATE 2 GM/50ML IV SOLN
2.0000 g | Freq: Once | INTRAVENOUS | Status: AC
  Administered 2019-05-31: 2 g via INTRAVENOUS
  Filled 2019-05-31: qty 50

## 2019-05-31 MED ORDER — SODIUM CHLORIDE 0.9 % IV SOLN: INTRAVENOUS | Status: DC

## 2019-05-31 MED ORDER — ACETAMINOPHEN 650 MG RE SUPP: 650.0000 mg | Freq: Four times a day (QID) | RECTAL | Status: DC | PRN

## 2019-05-31 MED ORDER — SODIUM CHLORIDE 0.9 % IV SOLN
INTRAVENOUS | Status: AC
  Administered 2019-06-01: 85 mL via INTRAVENOUS

## 2019-05-31 MED ORDER — ONDANSETRON HCL 4 MG PO TABS: 4.0000 mg | ORAL_TABLET | Freq: Four times a day (QID) | ORAL | Status: DC | PRN

## 2019-05-31 MED ORDER — ONDANSETRON HCL 4 MG/2ML IJ SOLN: 4.0000 mg | Freq: Four times a day (QID) | INTRAMUSCULAR | Status: DC | PRN

## 2019-05-31 NOTE — ED Notes (Signed)
Pt and pt spouse, [name] request that [name] be contacted with any new information or when the MD rounds in the morning due to the pt's decreased and delay in comprehension. [name] # [phone].

## 2019-05-31 NOTE — Progress Notes (Signed)
LTM EEG hooked up and running - no initial skin breakdown - push button tested - neuro notified.  

## 2019-05-31 NOTE — Progress Notes (Signed)
Patient sitting up in bed talking to husband. AAOX4, responds appropriately. Denies current needs. EEG in progress. Instructed to call PRN. Call light within reach, will continue to monitor.

## 2019-05-31 NOTE — Procedures (Signed)
Patient Name: Kristen Foley  MRN: [id number]  Epilepsy Attending: Lora Havens  Referring Physician/Provider: Dr Kandice Robinsons Date: 05/30/2019  Duration: 24.55 mins  Patient history: 53 y.o. female presenting with acute onset of right sided paresthesia and expressive/receptive aphasia. EEG to evaluate for seizure.  Level of alertness: awake  AEDs during EEG study: LEV  Technical aspects: This EEG study was done with scalp electrodes positioned according to the 10-20 International system of electrode placement. Electrical activity was acquired at a sampling rate of 500Hz  and reviewed with a high frequency filter of 70Hz  and a low frequency filter of 1Hz . EEG data were recorded continuously and digitally stored.   DESCRIPTION:  The posterior dominant rhythm consists of 9 Hz activity of moderate voltage (25-35 uV) seen predominantly in posterior head regions, symmetric and reactive to eye opening and eye closing. EEG showed continuous 3-5hz  theta-delta slowing in left frontal-anterior temporal region. Sharp transients were also noted in left frontotemporal region.  Hyperventilation and photic stimulation were not performed.  ABNORMALITY - Continuous slow, left frontal-anterior temporal region  IMPRESSION: This study is suggestive of cortical dysfunction in left frontotemporal region which is non specific and could be secondary to underlying structural lesion, post ictal state. No seizures or definite epileptiform discharges were seen throughout the recording.  Dr Cheral Marker was notified.     Jaila Schellhorn Barbra Sarks

## 2019-05-31 NOTE — Progress Notes (Signed)
Patient condition improved from AM assessment, speaking coherently. Husband at bedside, reviewing hospital cafeteria menu with patient. EEG in progress. Denies needs. Call light within reach. Instructed to call for any needs.

## 2019-05-31 NOTE — Progress Notes (Signed)
Progress Note    Kristen Foley  [id number] DOB: [birth year]  DOA: 05/30/2019 PCP: Unk Pinto, MD    Brief Narrative:   Chief complaint: right leg tingling/dyarthria  Medical records reviewed and are as summarized below:  Kristen Foley is an 54 y.o. female with a past medical history that includes vitamin D deficiency, hyperlipidemia presented to the emergency department February 21 chief complaint tingling of right leg and speech disturbance.  Also complained of an episode where she seemed to stare off and not answer questions or respond for several seconds.  Work-up includes noncontrast head CT notable for nonspecific vague asymmetric hypodensity involving the left temporal operculum:, CT perfusion study negative and CTA head and neck notable for hyperemia involving the left frontal operculum and cervical right ICA fibromuscular dysplasia.  IV Keppra was started EEG was done and an LP as well.  Assessment/Plan:   Principal Problem:   Seizures ([place]) Active Problems:   Numbness and tingling of right leg   Speech disturbance   Transient alteration of awareness   #1.  Right-sided paresthesias, speech disturbance, alteration in awareness concerning for seizure.  Evaluated by neurology who opine CT the head not consistent with stroke, CT perfusion findings concerning for seizures due to primary underlying tumor/meningitis/encephalitis.  -Keppra per neurology -LTM EEG -Follow LP studies -Seizure precautions -As needed Ativan  #2.  Speech disturbance.  Continues with significant aphasia.  Evaluated by neurology who opined this could be post ictal phenomena versus intermittent seizures.  They also recommend if infectious work-up negative and she continues to have significant aphasia consider steroids -See above -Monitor  #3.  Hypomagnesemia. Mag 1.7. repleted   Family Communication/Anticipated D/C date and plan/Code Status   DVT prophylaxis: Lovenox ordered. Code  Status: Full Code.  Family Communication: husband at bedside Disposition Plan: home   Medical Consultants:    Yadav neuro  [facility] neuro   Anti-Infectives:    None  Subjective:   Awake alert continues with significant aphasia  Objective:    Vitals:   05/31/19 0748 05/31/19 0900 05/31/19 1033 05/31/19 1230  BP: (!) 176/81 124/71 136/71 123/74  Pulse: (!) 106  84 81  Resp: (!) 24  16 16   Temp: 97.6 F (36.4 C)  97.8 F (36.6 C) 98.6 F (37 C)  TempSrc: Oral  Axillary Oral  SpO2: 96% 96% 99% 100%  Weight:      Height:        Intake/Output Summary (Last 24 hours) at 05/31/2019 1240 Last data filed at 05/31/2019 1227 Gross per 24 hour  Intake 267.87 ml  Output 900 ml  Net -632.13 ml   Filed Weights   05/30/19 1909 05/30/19 1915 05/31/19 0108  Weight: 48 kg 52.4 kg 49.5 kg    Exam: General: Awake alert no acute distress CV: Regular rate and rhythm no murmur gallop or rub no lower extremity edema Respiratory: No increased work of breathing breath sounds are clear bilaterally throughout I hear no wheeze no crackles Abdomen: Soft nondistended positive bowel sounds throughout nontender to palpation no guarding or rebounding Musculoskeletal: Joints without swelling/erythema moves all extremities spontaneously full range of motion Neuro: Awake alert significant aphasia difficulty following simple commands  Data Reviewed:   I have personally reviewed following labs and imaging studies:  Labs: Labs show the following:   Basic Metabolic Panel: Recent Labs  Lab 05/30/19 1906 05/30/19 1906 05/30/19 1929 05/30/19 2111 05/31/19 0226  NA 138  --  139  --  142  K 3.7   < > 3.6  --  3.7  CL 102  --  103  --  109  CO2 23  --   --   --  23  GLUCOSE 120*  --  113*  --  99  BUN 14  --  15  --  8  CREATININE 0.85  --  0.60  --  0.63  CALCIUM 9.6  --   --   --  8.3*  MG  --   --   --  1.7  --    < > = values in this interval not displayed.   GFR Estimated  Creatinine Clearance: 65 mL/min (by C-G formula based on SCr of 0.63 mg/dL). Liver Function Tests: Recent Labs  Lab 05/30/19 1906 05/31/19 0226  AST 26 18  ALT 17 13  ALKPHOS 54 42  BILITOT 0.6 1.0  PROT 7.4 5.6*  ALBUMIN 4.4 3.3*   No results for input(s): LIPASE, AMYLASE in the last 168 hours. No results for input(s): AMMONIA in the last 168 hours. Coagulation profile Recent Labs  Lab 05/30/19 1906  INR 1.1    CBC: Recent Labs  Lab 05/30/19 1906 05/30/19 1929 05/31/19 0226  WBC 8.1  --  8.3  NEUTROABS 6.4  --  6.1  HGB 14.2 15.0 12.2  HCT 44.3 44.0 37.6  MCV 97.6  --  98.4  PLT 244  --  209   Cardiac Enzymes: No results for input(s): CKTOTAL, CKMB, CKMBINDEX, TROPONINI in the last 168 hours. BNP (last 3 results) No results for input(s): PROBNP in the last 8760 hours. CBG: Recent Labs  Lab 05/30/19 1928  GLUCAP 110*   D-Dimer: No results for input(s): DDIMER in the last 72 hours. Hgb A1c: No results for input(s): HGBA1C in the last 72 hours. Lipid Profile: No results for input(s): CHOL, HDL, LDLCALC, TRIG, CHOLHDL, LDLDIRECT in the last 72 hours. Thyroid function studies: No results for input(s): TSH, T4TOTAL, T3FREE, THYROIDAB in the last 72 hours.  Invalid input(s): FREET3 Anemia work up: No results for input(s): VITAMINB12, FOLATE, FERRITIN, TIBC, IRON, RETICCTPCT in the last 72 hours. Sepsis Labs: Recent Labs  Lab 05/30/19 1906 05/31/19 0226  WBC 8.1 8.3    Microbiology Recent Results (from the past 240 hour(s))  CSF culture     Status: None (Preliminary result)   Collection Time: 05/31/19 12:00 AM   Specimen: CSF; Cerebrospinal Fluid  Result Value Ref Range Status   Specimen Description CSF  Final   Special Requests NO 2  Final   Gram Stain   Final    WBC PRESENT, PREDOMINANTLY PMN NO ORGANISMS SEEN CYTOSPIN SMEAR Performed at [facility], [place] [address] [place], [place] [address]    Culture PENDING  Incomplete   Report  Status PENDING  Incomplete  SARS CORONAVIRUS 2 (TAT 6-24 HRS) Nasopharyngeal Nasopharyngeal Swab     Status: None   Collection Time: 05/31/19 12:16 AM   Specimen: Nasopharyngeal Swab  Result Value Ref Range Status   SARS Coronavirus 2 NEGATIVE NEGATIVE Final    Comment: (NOTE) SARS-CoV-2 target nucleic acids are NOT DETECTED. The SARS-CoV-2 RNA is generally detectable in upper and lower respiratory specimens during the acute phase of infection. Negative results do not preclude SARS-CoV-2 infection, do not rule out co-infections with other pathogens, and should not be used as the sole basis for treatment or other patient management decisions. Negative results must be combined with clinical observations, patient history, and epidemiological information.  The expected result is Negative. Fact Sheet for Patients: SugarRoll.be Fact Sheet for Healthcare Providers: https://www.woods-mathews.com/ This test is not yet approved or cleared by the [place] FDA and  has been authorized for detection and/or diagnosis of SARS-CoV-2 by FDA under an Emergency Use Authorization (EUA). This EUA will remain  in effect (meaning this test can be used) for the duration of the COVID-19 declaration under Section 56 4(b)(1) of the Act, 21 U.S.C. section 360bbb-3(b)(1), unless the authorization is terminated or revoked sooner. Performed at [facility], [place] [address], [place], [place] [address]     Procedures and diagnostic studies:  EEG  Result Date: 05/30/2019 Lora Havens, MD     05/31/2019  9:20 AM Patient Name: Kristen Foley MRN: [id number] Epilepsy Attending: Lora Havens Referring Physician/Provider: Dr Kandice Robinsons Date: 05/30/2019 Duration: 24.55 mins Patient history: 54 y.o. female presenting with acute onset of right sided paresthesia and expressive/receptive aphasia. EEG to evaluate for seizure. Level of alertness: awake AEDs during EEG  study: LEV Technical aspects: This EEG study was done with scalp electrodes positioned according to the 10-20 International system of electrode placement. Electrical activity was acquired at a sampling rate of 500Hz  and reviewed with a high frequency filter of 70Hz  and a low frequency filter of 1Hz . EEG data were recorded continuously and digitally stored. DESCRIPTION:  The posterior dominant rhythm consists of 9 Hz activity of moderate voltage (25-35 uV) seen predominantly in posterior head regions, symmetric and reactive to eye opening and eye closing. EEG showed continuous 3-5hz  theta-delta slowing in left frontal-anterior temporal region. Sharp transients were also noted in left frontotemporal region.  Hyperventilation and photic stimulation were not performed. ABNORMALITY - Continuous slow, left frontal-anterior temporal region IMPRESSION: This study is suggestive of cortical dysfunction in left frontotemporal region which is non specific and could be secondary to underlying structural lesion, post ictal state. No seizures or definite epileptiform discharges were seen throughout the recording. Dr Cheral Marker was notified. Lora Havens   CT Code Stroke CTA Head W/WO contrast  Result Date: 05/30/2019 CLINICAL DATA:  54 year old female code stroke presentation with aphasia. Vague hyperdensity left temporal operculum on plain CT at 1926 hours today. EXAM: CT ANGIOGRAPHY HEAD AND NECK CT PERFUSION BRAIN TECHNIQUE: Multidetector CT imaging of the head and neck was performed using the standard protocol during bolus administration of intravenous contrast. Multiplanar CT image reconstructions and MIPs were obtained to evaluate the vascular anatomy. Carotid stenosis measurements (when applicable) are obtained utilizing NASCET criteria, using the distal internal carotid diameter as the denominator. Multiphase CT imaging of the brain was performed following IV bolus contrast injection. Subsequent parametric perfusion  maps were calculated using RAPID software. CONTRAST:  152mL OMNIPAQUE IOHEXOL 350 MG/ML SOLN COMPARISON:  plain CT at 1926 hours today. FINDINGS: CT Brain Perfusion Findings: ASPECTS: 10 (vague hyperdense area left operculum) CBF (<30%) Volume: None Perfusion (Tmax>6.0s) volume: None Mismatch Volume: None Infarction Location:None Other findings: Focally increased CBV and CBF in the same area of vague hyperdensity by plain CT. CTA NECK Skeleton: Chronic degenerative and/or posttraumatic changes to the right TMJ. Lower cervical disc, endplate and facet degeneration. No acute osseous abnormality identified. Degeneration Upper chest: Negative. Other neck: Thyroid size at the upper limits of normal, no definite discrete thyroid nodule. No acute soft tissue findings identified in the neck. Aortic arch: 3 vessel arch configuration with no arch atherosclerosis. Right carotid system: Negative right CCA and right carotid bifurcation. There is a prominent beaded  appearance of the distal cervical right ICA (series 9, image 15) in keeping with Fibromuscular Dysplasia (FMD). No stenosis. Left carotid system: Negative left CCA and left carotid bifurcation. Minimal if any luminal irregularity of the cervical left ICA and no stenosis. Vertebral arteries: Normal proximal right subclavian artery, right vertebral artery origin. The right vertebral appears mildly non dominant and is patent to the skull base without plaque or stenosis. Normal proximal left subclavian artery and left vertebral artery origin. The left vertebral is mildly dominant and patent to the skull base without plaque or stenosis. CTA HEAD Posterior circulation: Dominant left V4 segment. Patent PICA origins. Patent vertebrobasilar junction. Patent basilar artery. Fetal type right PCA origin. SCA and left PCA origins within normal limits. Left posterior communicating artery diminutive or absent. Bilateral PCA branches are within normal limits. Anterior circulation:  Both ICA siphons are patent. On the left there is no plaque or stenosis. The right siphon also appears normal. Normal ophthalmic and right posterior communicating artery origins. Patent carotid termini. Normal MCA and ACA origins. Anterior communicating artery and bilateral ACA branches are within normal limits. Right MCA M1 segment and bifurcation are patent without stenosis. Right MCA branches are within normal limits. Left MCA M1 segment and bifurcation are also patent without stenosis. Left MCA branches appear within normal limits. But there are asymmetric vessels which seem to be an increase in cortical venous drainage about the left operculum (series 7, image 19). See also series 8, image 19. There is no AVM nidus identified and no abnormally enlarged arterial supply to the region. Elsewhere intracranial vascularity appears symmetric and within normal limits. Venous sinuses: Patent. Anatomic variants: Fetal type right PCA origin. Review of the MIP images confirms the above findings IMPRESSION: 1. Negative for LVO and CTP detects no ischemia or infarct. 2. Positive for hyperemia in the left frontal operculum corresponding to the area of vague hyperdensity by plain CT. No discrete AVM and no enhancement typical of meningioma identified. Other top considerations include sequelae of seizure and poorly enhancing tumor (such as a low to intermediate grade glioma). 3. Preliminary report of the above was discussed by telephone with Dr. Kerney Elbe on 05/30/2019 at 1956 hours. MRI is pending. 4. Additionally, there is cervical Right ICA Fibromuscular Dysplasia (FMD). But no atherosclerosis or stenosis in the head or neck. Electronically Signed   By: Genevie Ann M.D.   On: 05/30/2019 20:16   CT Code Stroke CTA Neck W/WO contrast  Result Date: 05/30/2019 CLINICAL DATA:  54 year old female code stroke presentation with aphasia. Vague hyperdensity left temporal operculum on plain CT at 1926 hours today. EXAM: CT  ANGIOGRAPHY HEAD AND NECK CT PERFUSION BRAIN TECHNIQUE: Multidetector CT imaging of the head and neck was performed using the standard protocol during bolus administration of intravenous contrast. Multiplanar CT image reconstructions and MIPs were obtained to evaluate the vascular anatomy. Carotid stenosis measurements (when applicable) are obtained utilizing NASCET criteria, using the distal internal carotid diameter as the denominator. Multiphase CT imaging of the brain was performed following IV bolus contrast injection. Subsequent parametric perfusion maps were calculated using RAPID software. CONTRAST:  118mL OMNIPAQUE IOHEXOL 350 MG/ML SOLN COMPARISON:  plain CT at 1926 hours today. FINDINGS: CT Brain Perfusion Findings: ASPECTS: 10 (vague hyperdense area left operculum) CBF (<30%) Volume: None Perfusion (Tmax>6.0s) volume: None Mismatch Volume: None Infarction Location:None Other findings: Focally increased CBV and CBF in the same area of vague hyperdensity by plain CT. CTA NECK Skeleton: Chronic degenerative and/or posttraumatic changes  to the right TMJ. Lower cervical disc, endplate and facet degeneration. No acute osseous abnormality identified. Degeneration Upper chest: Negative. Other neck: Thyroid size at the upper limits of normal, no definite discrete thyroid nodule. No acute soft tissue findings identified in the neck. Aortic arch: 3 vessel arch configuration with no arch atherosclerosis. Right carotid system: Negative right CCA and right carotid bifurcation. There is a prominent beaded appearance of the distal cervical right ICA (series 9, image 15) in keeping with Fibromuscular Dysplasia (FMD). No stenosis. Left carotid system: Negative left CCA and left carotid bifurcation. Minimal if any luminal irregularity of the cervical left ICA and no stenosis. Vertebral arteries: Normal proximal right subclavian artery, right vertebral artery origin. The right vertebral appears mildly non dominant and is  patent to the skull base without plaque or stenosis. Normal proximal left subclavian artery and left vertebral artery origin. The left vertebral is mildly dominant and patent to the skull base without plaque or stenosis. CTA HEAD Posterior circulation: Dominant left V4 segment. Patent PICA origins. Patent vertebrobasilar junction. Patent basilar artery. Fetal type right PCA origin. SCA and left PCA origins within normal limits. Left posterior communicating artery diminutive or absent. Bilateral PCA branches are within normal limits. Anterior circulation: Both ICA siphons are patent. On the left there is no plaque or stenosis. The right siphon also appears normal. Normal ophthalmic and right posterior communicating artery origins. Patent carotid termini. Normal MCA and ACA origins. Anterior communicating artery and bilateral ACA branches are within normal limits. Right MCA M1 segment and bifurcation are patent without stenosis. Right MCA branches are within normal limits. Left MCA M1 segment and bifurcation are also patent without stenosis. Left MCA branches appear within normal limits. But there are asymmetric vessels which seem to be an increase in cortical venous drainage about the left operculum (series 7, image 19). See also series 8, image 19. There is no AVM nidus identified and no abnormally enlarged arterial supply to the region. Elsewhere intracranial vascularity appears symmetric and within normal limits. Venous sinuses: Patent. Anatomic variants: Fetal type right PCA origin. Review of the MIP images confirms the above findings IMPRESSION: 1. Negative for LVO and CTP detects no ischemia or infarct. 2. Positive for hyperemia in the left frontal operculum corresponding to the area of vague hyperdensity by plain CT. No discrete AVM and no enhancement typical of meningioma identified. Other top considerations include sequelae of seizure and poorly enhancing tumor (such as a low to intermediate grade glioma).  3. Preliminary report of the above was discussed by telephone with Dr. Kerney Elbe on 05/30/2019 at 1956 hours. MRI is pending. 4. Additionally, there is cervical Right ICA Fibromuscular Dysplasia (FMD). But no atherosclerosis or stenosis in the head or neck. Electronically Signed   By: Genevie Ann M.D.   On: 05/30/2019 20:16   MR BRAIN W WO CONTRAST  Result Date: 05/30/2019 CLINICAL DATA:  54 year old female code stroke presentation with aphasia. Abnormal hyperemia of the left operculum on CTA/CTP. Vague increased hyperdensity there on plain CT. EXAM: MRI HEAD WITHOUT AND WITH CONTRAST TECHNIQUE: Multiplanar, multiecho pulse sequences of the brain and surrounding structures were obtained without and with intravenous contrast. CONTRAST:  5.26mL GADAVIST GADOBUTROL 1 MMOL/ML IV SOLN COMPARISON:  CT head, CTA and CTP earlier today. FINDINGS: Brain: Abnormal signal in the left insula and temporal operculum which is primarily limited to the left superior temporal gyrus. There is gyral expansion. See series 11, image 13 and series 17, image 15. On DWI  there is gyriform increased trace signal but no convincing restricted diffusion, and some of the abnormal cortex is facilitated. No mineralization or blood products. The left deep gray nuclei appear spared, but similar abnormal gyriform signal and expansion tracks posteriorly through the left temporal lobe toward the lateral occipital lobe as far as coronal series 17, image 10 (FLAIR series 11, image 10), although this seems somewhat discontinuous with the epicenter. Following contrast, there is only trace crescentic enhancement which corresponds to the subcortical white matter of the affected superior temporal gyrus (series 20, image 21). No other abnormal enhancement. No leptomeningeal or dural thickening identified. There is trace rightward midline shift. No ventriculomegaly. No restricted diffusion to suggest acute infarction. No extra-axial collection or acute  intracranial hemorrhage. Cervicomedullary junction and pituitary are within normal limits. Incidental anterior right frontal lobe developmental venous anomaly (normal variant). Negative pituitary and cervicomedullary junction. The contralateral right hemisphere and the brainstem appear normal. Vascular: Major intracranial vascular flow voids are preserved. No abnormal flow voids. The major dural venous sinuses are enhancing and appear to be patent. Skull and upper cervical spine: Heterogeneous bone marrow signal although no destructive osseous lesion identified. Sinuses/Orbits: Negative orbits. Paranasal Visualized paranasal sinuses and mastoids are stable and well pneumatized. Other: Visible internal auditory structures appear normal. Scalp and face soft tissues appear negative. IMPRESSION: 1. Multifocal abnormally T2/FLAIR hyperintense and expanded gyri with epicenter at the left insula and operculum. Somewhat discontinuous involvement which tracks posteriorly toward the lateral left occiput. Only trace associated enhancement in the left superior temporal gyrus. No leptomeningeal or dural involvement. No diffusion restriction or hemorrhage. Mild intracranial mass effect, with trace rightward midline shift. 2. The top differential considerations are encephalitis (including an autoimmune encephalitis), an infiltrative primary tumor (including gliomatosis - although the elevated CBV/CBF may argue against a tumor with so little enhancement), and postictal edema. Study discussed by telephone with Dr. Kerney Elbe on 05/30/2019 at 21:31. It seems that clinical signs and symptoms currently favor seizure. Currently he is planning EEG, LP, and a repeat MRI in one week. Electronically Signed   By: Genevie Ann M.D.   On: 05/30/2019 21:36   CT Code Stroke Cerebral Perfusion with contrast  Result Date: 05/30/2019 CLINICAL DATA:  54 year old female code stroke presentation with aphasia. Vague hyperdensity left temporal operculum  on plain CT at 1926 hours today. EXAM: CT ANGIOGRAPHY HEAD AND NECK CT PERFUSION BRAIN TECHNIQUE: Multidetector CT imaging of the head and neck was performed using the standard protocol during bolus administration of intravenous contrast. Multiplanar CT image reconstructions and MIPs were obtained to evaluate the vascular anatomy. Carotid stenosis measurements (when applicable) are obtained utilizing NASCET criteria, using the distal internal carotid diameter as the denominator. Multiphase CT imaging of the brain was performed following IV bolus contrast injection. Subsequent parametric perfusion maps were calculated using RAPID software. CONTRAST:  16mL OMNIPAQUE IOHEXOL 350 MG/ML SOLN COMPARISON:  plain CT at 1926 hours today. FINDINGS: CT Brain Perfusion Findings: ASPECTS: 10 (vague hyperdense area left operculum) CBF (<30%) Volume: None Perfusion (Tmax>6.0s) volume: None Mismatch Volume: None Infarction Location:None Other findings: Focally increased CBV and CBF in the same area of vague hyperdensity by plain CT. CTA NECK Skeleton: Chronic degenerative and/or posttraumatic changes to the right TMJ. Lower cervical disc, endplate and facet degeneration. No acute osseous abnormality identified. Degeneration Upper chest: Negative. Other neck: Thyroid size at the upper limits of normal, no definite discrete thyroid nodule. No acute soft tissue findings identified in the neck. Aortic arch:  3 vessel arch configuration with no arch atherosclerosis. Right carotid system: Negative right CCA and right carotid bifurcation. There is a prominent beaded appearance of the distal cervical right ICA (series 9, image 15) in keeping with Fibromuscular Dysplasia (FMD). No stenosis. Left carotid system: Negative left CCA and left carotid bifurcation. Minimal if any luminal irregularity of the cervical left ICA and no stenosis. Vertebral arteries: Normal proximal right subclavian artery, right vertebral artery origin. The right  vertebral appears mildly non dominant and is patent to the skull base without plaque or stenosis. Normal proximal left subclavian artery and left vertebral artery origin. The left vertebral is mildly dominant and patent to the skull base without plaque or stenosis. CTA HEAD Posterior circulation: Dominant left V4 segment. Patent PICA origins. Patent vertebrobasilar junction. Patent basilar artery. Fetal type right PCA origin. SCA and left PCA origins within normal limits. Left posterior communicating artery diminutive or absent. Bilateral PCA branches are within normal limits. Anterior circulation: Both ICA siphons are patent. On the left there is no plaque or stenosis. The right siphon also appears normal. Normal ophthalmic and right posterior communicating artery origins. Patent carotid termini. Normal MCA and ACA origins. Anterior communicating artery and bilateral ACA branches are within normal limits. Right MCA M1 segment and bifurcation are patent without stenosis. Right MCA branches are within normal limits. Left MCA M1 segment and bifurcation are also patent without stenosis. Left MCA branches appear within normal limits. But there are asymmetric vessels which seem to be an increase in cortical venous drainage about the left operculum (series 7, image 19). See also series 8, image 19. There is no AVM nidus identified and no abnormally enlarged arterial supply to the region. Elsewhere intracranial vascularity appears symmetric and within normal limits. Venous sinuses: Patent. Anatomic variants: Fetal type right PCA origin. Review of the MIP images confirms the above findings IMPRESSION: 1. Negative for LVO and CTP detects no ischemia or infarct. 2. Positive for hyperemia in the left frontal operculum corresponding to the area of vague hyperdensity by plain CT. No discrete AVM and no enhancement typical of meningioma identified. Other top considerations include sequelae of seizure and poorly enhancing tumor  (such as a low to intermediate grade glioma). 3. Preliminary report of the above was discussed by telephone with Dr. Kerney Elbe on 05/30/2019 at 1956 hours. MRI is pending. 4. Additionally, there is cervical Right ICA Fibromuscular Dysplasia (FMD). But no atherosclerosis or stenosis in the head or neck. Electronically Signed   By: Genevie Ann M.D.   On: 05/30/2019 20:16   CT HEAD CODE STROKE WO CONTRAST  Addendum Date: 05/30/2019   ADDENDUM REPORT: 05/30/2019 20:17 ADDENDUM: Study was discussed by telephone with Dr. Cheral Marker at 1942 hours. Electronically Signed   By: Genevie Ann M.D.   On: 05/30/2019 20:17   Result Date: 05/30/2019 CLINICAL DATA:  Code stroke. 54 year old female with right side tingling/paresthesia and abnormal speech. EXAM: CT HEAD WITHOUT CONTRAST TECHNIQUE: Contiguous axial images were obtained from the base of the skull through the vertex without intravenous contrast. COMPARISON:  None. FINDINGS: Brain: Normal cerebral volume. No midline shift and basilar cisterns are patent. But there is a vague asymmetric area of mild hyperdensity associated with the left superior temporal gyrus or temporal operculum on series 3, image 11. This measures about 20 mm. No regional cerebral edema. Otherwise gray-white matter differentiation appears symmetric and within normal limits. No ventriculomegaly. No changes of acute cortically based infarct identified. Vascular: No suspicious intracranial vascular hyperdensity.  Skull: Negative. Sinuses/Orbits: Visualized paranasal sinuses and mastoids are clear. Other: Visualized orbit soft tissues are within normal limits. Visualized scalp soft tissues are within normal limits. ASPECTS [facility] Stroke Program Early CT Score) Total score (0-10 with 10 being normal): 10 IMPRESSION: 1. Vague asymmetric hyperdensity in a 20 mm area of the left temporal operculum. This is nonspecific but might be a meningioma. Acute intracranial hemorrhage is difficult to exclude. MRI without  and with contrast would best characterize further when feasible. 2. No superimposed CT changes of acute cortically based infarct identified. ASPECTS 10. These results were communicated to Dr. Cheral Marker at 7:32 pm on 05/30/2019 by text page via the [facility] messaging system. Electronically Signed: By: Genevie Ann M.D. On: 05/30/2019 19:33    Medications:   . sodium chloride flush  3 mL Intravenous Q12H   Continuous Infusions: . sodium chloride    . levETIRAcetam 1,000 mg (05/31/19 0931)     LOS: 0 days   Radene Gunning NP [facility]   How to contact the [facility] Attending or Consulting provider [place] or covering provider during after hours [place], for this patient?  1. Check the care team in [facility] and look for a) attending/consulting [facility] provider listed and b) the [facility] team listed 2. Log into [email or web address] and use [place] universal password to access. If you do not have the password, please contact the hospital operator. 3. Locate the [facility] provider you are looking for under [facility] and page to a number that you can be directly reached. 4. If you still have difficulty reaching the provider, please page the [facility] (Director on Call) for the Hospitalists listed on amion for assistance.  05/31/2019, 12:40 PM

## 2019-05-31 NOTE — Progress Notes (Signed)
Subjective: No acute events overnight.  Patient continues to have significant aphasia.  Husband at bedside.  ROS: Unable to obtain due to aphasia  Examination  Vital signs in last 24 hours: Temp:  [97.6 F (36.4 C)-98.9 F (37.2 C)] 97.6 F (36.4 C) (02/22 0748) Pulse Rate:  [63-115] 106 (02/22 0748) Resp:  [8-24] 24 (02/22 0748) BP: (102-182)/(61-97) 124/71 (02/22 0900) SpO2:  [96 %-100 %] 96 % (02/22 0900) Weight:  [48 kg-52.4 kg] 49.5 kg (02/22 0108)  General: lying in bed, not in apparent distress CVS: pulse-normal rate and rhythm RS: breathing comfortably, CTA B Extremities: normal , warm  Neuro: MS: Alert, aphasic, unable to follow commands, able to speak most of which appears relevant but not necessarily inflow with a conversation CN: pupils equal and reactive,  EOMI, face symmetric, tongue midline Motor: Voluntarily moving all 4 extremities with antigravity strength, difficult to assess strength further due to patient's aphasia  Basic Metabolic Panel: Recent Labs  Lab 05/30/19 1906 05/30/19 1929 05/30/19 2111 05/31/19 0226  NA 138 139  --  142  K 3.7 3.6  --  3.7  CL 102 103  --  109  CO2 23  --   --  23  GLUCOSE 120* 113*  --  99  BUN 14 15  --  8  CREATININE 0.85 0.60  --  0.63  CALCIUM 9.6  --   --  8.3*  MG  --   --  1.7  --     CBC: Recent Labs  Lab 05/30/19 1906 05/30/19 1929 05/31/19 0226  WBC 8.1  --  8.3  NEUTROABS 6.4  --  6.1  HGB 14.2 15.0 12.2  HCT 44.3 44.0 37.6  MCV 97.6  --  98.4  PLT 244  --  209     Coagulation Studies: Recent Labs    05/30/19 1906  LABPROT 13.9  INR 1.1    Imaging MRI brain with and without contrast 05/30/2019: 1. Multifocal abnormally T2/FLAIR hyperintense and expanded gyri with epicenter at the left insula and operculum. Somewhat discontinuous involvement which tracks posteriorly toward the lateral left occiput. Only trace associated enhancement in the left superior temporal gyrus. No leptomeningeal or  dural involvement. No diffusion restriction or hemorrhage. Mild intracranial mass effect, with trace rightward midline shift.  2. The top differential considerations are encephalitis (including an autoimmune encephalitis), an infiltrative primary tumor (including gliomatosis - although the elevated CBV/CBF may argue against a tumor with so little enhancement), and postictal edema.       CT perfusion 05/30/2019: Positive for hyperemia in the left frontal operculum corresponding to the area of vague hyperdensity by plain CT. No discrete AVM and no enhancement typical of meningioma identified. Other top considerations include sequelae of seizure and poorly enhancing tumor (such as a low to intermediate grade glioma).     ASSESSMENT AND PLAN:  54 y.o. female presenting with acute onset of right sided paresthesias and expressive/receptive aphasia.  Stat CT head was not consistent with stroke.  CT perfusion showed hyperemia in left frontal operculum and MRI brain showed abnormal restricted diffusion and T2 flair abnormality in left frontal operculum, insular region.  This constellation of findings are concerning for seizures due to primary underlying tumor, meningitis/encephalitis.   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  Cryptococcal antigen negative.  HSV, varicella, fungal culture, oligoclonal bands, IgG CSF index pending  Recommendations - Patient continues to have significant aphasia which could be postictal phenomena versus due to intermittent seizures.  Therefore will start LTM EEG to look for intermittent seizures -Continue Keppra 1000 mg twice daily, if seizures seen on LTM EEG will increase dose to 1500 mg twice daily -Discussed imaging  findings and CSF results so far with husband.  If infectious work-up is negative and patient continues to have significant aphasia, will consider starting steroids due to concern for autoimmune etiology. -Continue seizure precautions -As needed IV Ativan 2 mg for GTT lasting more than 2 minutes of focal seizure lasting more than 5 minutes -Management of rest of the comorbidities per primary team   I have spent a total of  45 minutes with the patient reviewing hospital notes,  test results, labs and examining the patient as well as establishing an assessment and plan that was discussed personally with the patient's husband.  > 50% of time was spent in direct patient care.

## 2019-06-01 LAB — COMPREHENSIVE METABOLIC PANEL
ALT: 12 U/L (ref 0–44)
AST: 15 U/L (ref 15–41)
Albumin: 2.9 g/dL — ABNORMAL LOW (ref 3.5–5.0)
Alkaline Phosphatase: 39 U/L (ref 38–126)
Anion gap: 7 (ref 5–15)
BUN: 9 mg/dL (ref 6–20)
CO2: 23 mmol/L (ref 22–32)
Calcium: 8.2 mg/dL — ABNORMAL LOW (ref 8.9–10.3)
Chloride: 112 mmol/L — ABNORMAL HIGH (ref 98–111)
Creatinine, Ser: 0.67 mg/dL (ref 0.44–1.00)
GFR calc Af Amer: >60 mL/min (ref >60)
GFR calc non Af Amer: >60 mL/min (ref >60)
Glucose, Bld: 91 mg/dL (ref 70–99)
Potassium: 3.7 mmol/L (ref 3.5–5.1)
Sodium: 142 mmol/L (ref 135–145)
Total Bilirubin: 0.8 mg/dL (ref 0.3–1.2)
Total Protein: 5 g/dL — ABNORMAL LOW (ref 6.5–8.1)

## 2019-06-01 LAB — HSV DNA BY PCR (REFERENCE LAB)
HSV 1 DNA: NEGATIVE
HSV 2 DNA: NEGATIVE

## 2019-06-01 LAB — VARICELLA-ZOSTER BY PCR: Varicella-Zoster, PCR: NEGATIVE

## 2019-06-01 LAB — MAGNESIUM: Magnesium: 1.7 mg/dL (ref 1.7–2.4)

## 2019-06-01 MED ORDER — LEVETIRACETAM IN NACL 500 MG/100ML IV SOLN
500.0000 mg | Freq: Once | INTRAVENOUS | Status: AC
  Administered 2019-06-01: 500 mg via INTRAVENOUS
  Filled 2019-06-01: qty 100

## 2019-06-01 MED ORDER — LEVETIRACETAM IN NACL 1500 MG/100ML IV SOLN
1500.0000 mg | Freq: Two times a day (BID) | INTRAVENOUS | Status: DC
  Administered 2019-06-01 – 2019-06-02 (×2): 1500 mg via INTRAVENOUS
  Filled 2019-06-01 (×2): qty 100

## 2019-06-01 MED ORDER — MAGNESIUM SULFATE 2 GM/50ML IV SOLN
2.0000 g | Freq: Once | INTRAVENOUS | Status: AC
  Administered 2019-06-01: 11:00:00 2 g via INTRAVENOUS
  Filled 2019-06-01: qty 50

## 2019-06-01 MED ORDER — SODIUM CHLORIDE 0.9 % IV SOLN: INTRAVENOUS | Status: DC | PRN

## 2019-06-01 NOTE — Procedures (Signed)
Patient Name: Kristen Foley  MRN: [id number]  Epilepsy Attending: Lora Havens  Referring Physician/Provider: Dr  Zeb Comfort Duration: 05/31/2019 1238 to 06/01/2019 1042  Patient history: 54 y.o.femalepresenting with acute onset of right sided paresthesia and expressive/receptive aphasia. EEG to evaluate for seizure.  Level of alertness: awake, asleep  AEDs during EEG study: LEV  Technical aspects: This EEG study was done with scalp electrodes positioned according to the 10-20 International system of electrode placement. Electrical activity was acquired at a sampling rate of 500Hz  and reviewed with a high frequency filter of 70Hz  and a low frequency filter of 1Hz . EEG data were recorded continuously and digitally stored.   DESCRIPTION:  The posterior dominant rhythm consists of 9 Hz activity of moderate voltage (25-35 uV) seen predominantly in posterior head regions, symmetric and reactive to eye opening and eye closing. EEG showed intermittent 2-3Hz delta slowing in left frontal-anterior temporal region which at times appeared rhythmic. Sharp waves were also noted in left frontotemporal region which at times (predominantly in sleep) appeared periodic at 0.5 to 1 Hz.  Hyperventilation and photic stimulation were not performed.  ABNORMALITY -Sharp waves, left frontotemporal region -Intermittent rhythmic delta activity, left frontal-anterior temporal region  IMPRESSION: This study is suggestive of epileptogenicity and cortical dysfunction in left frontotemporal region likely secondary to underlying cortical lesion.  At times, rhythmic delta activity was seen in left frontal-anterior temporal region which is on the ictal-interictal continuum.   Juliani Laduke Barbra Sarks

## 2019-06-01 NOTE — Evaluation (Signed)
Physical Therapy Evaluation Patient Details Name: Kristen Foley MRN: [id number] DOB: 03-Aug-1965 Today's Date: 06/01/2019   History of Present Illness  Kristen Foley is a 54 y.o. female with no known significant past medical history, now presenting to the emergency department for evaluation of paresthesias involving the right side, speech disturbance, and transient alteration in awareness. Abnormal MRI with possibilities being seizure, brain tumor, encephalitis.   Clinical Impression  Patient evaluated by Physical Therapy with no further acute PT needs identified. All education has been completed and the patient has no further questions. Pt independent with mobility. Had some tingling RLE with ambulation but less than when she was supine in bed and it did not affect gait pattern or balance. Pt did have word finding and transposing issues especially when being physically challenged. Recommend speech eval.  See below for any follow-up Physical Therapy or equipment needs. PT is signing off. Thank you for this referral.     Follow Up Recommendations No PT follow up    Equipment Recommendations  None recommended by PT    Recommendations for Other Services Speech consult     Precautions / Restrictions Precautions Precautions: None Restrictions Weight Bearing Restrictions: No      Mobility  Bed Mobility Overal bed mobility: Independent                Transfers Overall transfer level: Independent Equipment used: None                Ambulation/Gait Ambulation/Gait assistance: Independent Gait Distance (Feet): 500 Feet Assistive device: None Gait Pattern/deviations: WFL(Within Functional Limits) Gait velocity: WFL Gait velocity interpretation: >4.37 ft/sec, indicative of normal walking speed General Gait Details: pt had no difficulties with ambulation. Did report mild tingling in R foot and up lef but no knee buckling or gait instability due to this  Stairs Stairs:  Yes Stairs assistance: Modified independent (Device/Increase time) Stair Management: Alternating pattern;No rails;Forwards Number of Stairs: 5 General stair comments: no difficulty on stairs without use of rail  Wheelchair Mobility    Modified Rankin (Stroke Patients Only)       Balance Overall balance assessment: Independent                                           Pertinent Vitals/Pain Pain Assessment: No/denies pain    Home Living Family/patient expects to be discharged to:: Private residence Living Arrangements: Spouse/significant other Available Help at Discharge: Family;Available PRN/intermittently Type of Home: House Home Access: Stairs to enter   [employer] of Steps: 5 Home Layout: One level Home Equipment: None      Prior Function Level of Independence: Independent         Comments: [place]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      Hand Dominance        Extremity/Trunk Assessment   Upper Extremity Assessment Upper Extremity Assessment: Overall WFL for tasks assessed    Lower Extremity Assessment Lower Extremity Assessment: Overall WFL for tasks assessed    Cervical / Trunk Assessment Cervical / Trunk Assessment: Normal  Communication   Communication: Expressive difficulties;Receptive difficulties  Cognition Arousal/Alertness: Awake/alert Behavior During Therapy: WFL for tasks assessed/performed Overall Cognitive Status: Impaired/Different from baseline Area of Impairment: Problem solving  Problem Solving: Difficulty sequencing;Requires verbal cues General Comments: pt is having difficulty processing commands and often needs for them to be repeated      General Comments General comments (skin integrity, edema, etc.): initially pt's speech very clear but when she was trying to think of harder topics or speak more  quickly, she began having word finding difficulties and transposing words with incorrect words. She also began having receptive difficulties    Exercises     Assessment/Plan    PT Assessment Patent does not need any further PT services  PT Problem List         PT Treatment Interventions      PT Goals (Current goals can be found in the Care Plan section)  Acute Rehab PT Goals Patient Stated Goal: get better PT Goal Formulation: All assessment and education complete, DC therapy    Frequency     Barriers to discharge        Co-evaluation               AM-PAC PT "6 Clicks" Mobility  Outcome Measure Help needed turning from your back to your side while in a flat bed without using bedrails?: None Help needed moving from lying on your back to sitting on the side of a flat bed without using bedrails?: None Help needed moving to and from a bed to a chair (including a wheelchair)?: None Help needed standing up from a chair using your arms (e.g., wheelchair or bedside chair)?: None Help needed to walk in hospital room?: None Help needed climbing 3-5 steps with a railing? : None 6 Click Score: 24    End of Session Equipment Utilized During Treatment: Gait belt Activity Tolerance: Patient tolerated treatment well Patient left: in chair;with call bell/phone within reach;with family/visitor present Nurse Communication: Mobility status PT Visit Diagnosis: Other abnormalities of gait and mobility (R26.89)    Time: [id number] PT Time Calculation (min) (ACUTE ONLY): 27 min   Charges:   PT Evaluation $PT Eval Moderate Complexity: 1 Mod PT Treatments $Gait Training: 8-22 mins        Leighton Roach, PT  Acute Rehab Services  Pager [phone] Office [place] 06/01/2019, 4:17 PM

## 2019-06-01 NOTE — Progress Notes (Signed)
EEG discontinued- Results pending.  No skin breakdown.

## 2019-06-01 NOTE — Progress Notes (Signed)
Per husband, pt had a tick bite appx 1 year ago, and developed a "bulls eye rash". She was given antibiotics, & husband believes she did not subsequently test positive for Lyme disease.

## 2019-06-01 NOTE — Progress Notes (Signed)
Progress Note    Kristen Foley  [id number] DOB: Sep 05, 1965  DOA: 05/30/2019 PCP: Unk Pinto, MD    Brief Narrative:    Kristen Foley is an 54 y.o. female with a past medical history that includes vitamin D deficiency, hyperlipidemia presented to the emergency department February 21 chief complaint tingling of right leg and speech disturbance.  Also complained of an episode where she seemed to stare off and not answer questions or respond for several seconds.  Work-up includes CT perfusion showed abnormal restricted diffusion and T2 flair abnormality in left frontal operculum, insular region. Neurology on board. MRI brain showed multifocal abnormally T2/FLAIR hyperintense and expanded gyri with epicenter at the left insula and operculum. Started on keppra for new onset seizures.   Assessment/Plan:   Principal Problem:   Seizures (HCC) Active Problems:   Numbness and tingling of right leg   Speech disturbance   Transient alteration of awareness   Hypomagnesemia   Right-sided paresthesias, speech disturbance, alteration in awareness  New onset seizures Infectious vs autoimmune encephalitis vs primary underlying tumor  - Neurology on board - Abnormal CT perfusion study/ MRI brain as noted above - LTM EEG abnormal. Discontinued by neuro - keppra dose increased to 1500 mg BID  - CSF studies: colorless, cell count 1637 RBCs (tube 4), WBC 3 predominant PMN, protein normal, gram stain no organisms, culture NGTD, cryptococcal Ag negative; VZV PCR, HSV PCR, IgG CSF index, oligoclonal bands, fungal culture pending - Patient concerned about possible lyme's disease given h/o tick bite in 2019 that was treated with doxycycline. Less likely related to lyme per neurology - HIV negative  -Seizure precautions -PRN ativan as per neuro recommendations  Hypomagnesemia - replace IV as needed    Family Communication/Anticipated D/C date and plan/Code Status   DVT prophylaxis:  Lovenox Code Status: Full Code.  Family Communication: husband at bedside Disposition Plan: home when cleared by neurology   Medical Consultants:    Neurology   Anti-Infectives:    None  Subjective:   Alert. Difficulty hearing but comprehending well and responding appropriately. Tearful. Doing much better than yesterday per husband. Still has some tingling right UE/LE.   Objective:    Vitals:   05/31/19 2342 06/01/19 0355 06/01/19 0857 06/01/19 1309  BP: 108/66 110/74 118/75 126/72  Pulse: 65 60 84 79  Resp: 17 18 17 18   Temp: 98.7 F (37.1 C) 97.8 F (36.6 C) 98.9 F (37.2 C) 98.9 F (37.2 C)  TempSrc: Oral Oral Oral Oral  SpO2: 100% 100% 100% 99%  Weight:      Height:        Intake/Output Summary (Last 24 hours) at 06/01/2019 1548 Last data filed at 06/01/2019 1200 Gross per 24 hour  Intake 1277.5 ml  Output 1000 ml  Net 277.5 ml   Filed Weights   05/30/19 1909 05/30/19 1915 05/31/19 0108  Weight: 48 kg 52.4 kg 49.5 kg    Exam: General: Awake alert no acute distress CV: Regular rate and rhythm no murmur gallop or rub no lower extremity edema Respiratory: No increased work of breathing breath sounds are clear bilaterally throughout I hear no wheeze no crackles Abdomen: Soft nondistended positive bowel sounds throughout nontender to palpation no guarding or rebounding Musculoskeletal: Joints without swelling/erythema moves all extremities spontaneously full range of motion Neuro: Awake alert, oriented X3, able to name objects, comprehension intact  Data Reviewed:   I have personally reviewed following labs and imaging studies:  Labs: Labs  show the following:   Basic Metabolic Panel: Recent Labs  Lab 05/30/19 1906 05/30/19 1906 05/30/19 1929 05/30/19 1929 05/30/19 2111 05/31/19 0226 06/01/19 0402  NA 138  --  139  --   --  142 142  K 3.7   < > 3.6   < >  --  3.7 3.7  CL 102  --  103  --   --  109 112*  CO2 23  --   --   --   --  23 23   GLUCOSE 120*  --  113*  --   --  99 91  BUN 14  --  15  --   --  8 9  CREATININE 0.85  --  0.60  --   --  0.63 0.67  CALCIUM 9.6  --   --   --   --  8.3* 8.2*  MG  --   --   --   --  1.7  --  1.7   < > = values in this interval not displayed.   GFR Estimated Creatinine Clearance: 65 mL/min (by C-G formula based on SCr of 0.67 mg/dL). Liver Function Tests: Recent Labs  Lab 05/30/19 1906 05/31/19 0226 06/01/19 0402  AST 26 18 15   ALT 17 13 12   ALKPHOS 54 42 39  BILITOT 0.6 1.0 0.8  PROT 7.4 5.6* 5.0*  ALBUMIN 4.4 3.3* 2.9*   No results for input(s): LIPASE, AMYLASE in the last 168 hours. No results for input(s): AMMONIA in the last 168 hours. Coagulation profile Recent Labs  Lab 05/30/19 1906  INR 1.1    CBC: Recent Labs  Lab 05/30/19 1906 05/30/19 1929 05/31/19 0226  WBC 8.1  --  8.3  NEUTROABS 6.4  --  6.1  HGB 14.2 15.0 12.2  HCT 44.3 44.0 37.6  MCV 97.6  --  98.4  PLT 244  --  209   Cardiac Enzymes: No results for input(s): CKTOTAL, CKMB, CKMBINDEX, TROPONINI in the last 168 hours. BNP (last 3 results) No results for input(s): PROBNP in the last 8760 hours. CBG: Recent Labs  Lab 05/30/19 1928  GLUCAP 110*   D-Dimer: No results for input(s): DDIMER in the last 72 hours. Hgb A1c: No results for input(s): HGBA1C in the last 72 hours. Lipid Profile: No results for input(s): CHOL, HDL, LDLCALC, TRIG, CHOLHDL, LDLDIRECT in the last 72 hours. Thyroid function studies: No results for input(s): TSH, T4TOTAL, T3FREE, THYROIDAB in the last 72 hours.  Invalid input(s): FREET3 Anemia work up: No results for input(s): VITAMINB12, FOLATE, FERRITIN, TIBC, IRON, RETICCTPCT in the last 72 hours. Sepsis Labs: Recent Labs  Lab 05/30/19 1906 05/31/19 0226  WBC 8.1 8.3    Microbiology Recent Results (from the past 240 hour(s))  CSF culture     Status: None (Preliminary result)   Collection Time: 05/31/19 12:00 AM   Specimen: CSF; Cerebrospinal Fluid   Result Value Ref Range Status   Specimen Description CSF  Final   Special Requests NO 2  Final   Gram Stain   Final    WBC PRESENT, PREDOMINANTLY PMN NO ORGANISMS SEEN CYTOSPIN SMEAR    Culture   Final    NO GROWTH 1 DAY Performed at [facility], [address], [place], [place] [address]    Report Status PENDING  Incomplete  SARS CORONAVIRUS 2 (TAT 6-24 HRS) Nasopharyngeal Nasopharyngeal Swab     Status: None   Collection Time: 05/31/19 12:16 AM  Specimen: Nasopharyngeal Swab  Result Value Ref Range Status   SARS Coronavirus 2 NEGATIVE NEGATIVE Final    Comment: (NOTE) SARS-CoV-2 target nucleic acids are NOT DETECTED. The SARS-CoV-2 RNA is generally detectable in upper and lower respiratory specimens during the acute phase of infection. Negative results do not preclude SARS-CoV-2 infection, do not rule out co-infections with other pathogens, and should not be used as the sole basis for treatment or other patient management decisions. Negative results must be combined with clinical observations, patient history, and epidemiological information. The expected result is Negative. Fact Sheet for Patients: SugarRoll.be Fact Sheet for Healthcare Providers: https://www.woods-mathews.com/ This test is not yet approved or cleared by the [place] FDA and  has been authorized for detection and/or diagnosis of SARS-CoV-2 by FDA under an Emergency Use Authorization (EUA). This EUA will remain  in effect (meaning this test can be used) for the duration of the COVID-19 declaration under Section 56 4(b)(1) of the Act, 21 U.S.C. section 360bbb-3(b)(1), unless the authorization is terminated or revoked sooner. Performed at [facility], [place] [address], [place], [place] [address]     Procedures and diagnostic studies:  CT Code Stroke CTA Head W/WO contrast  Result Date: 05/30/2019 CLINICAL DATA:  54 year old female code stroke  presentation with aphasia. Vague hyperdensity left temporal operculum on plain CT at 1926 hours today. EXAM: CT ANGIOGRAPHY HEAD AND NECK CT PERFUSION BRAIN TECHNIQUE: Multidetector CT imaging of the head and neck was performed using the standard protocol during bolus administration of intravenous contrast. Multiplanar CT image reconstructions and MIPs were obtained to evaluate the vascular anatomy. Carotid stenosis measurements (when applicable) are obtained utilizing NASCET criteria, using the distal internal carotid diameter as the denominator. Multiphase CT imaging of the brain was performed following IV bolus contrast injection. Subsequent parametric perfusion maps were calculated using RAPID software. CONTRAST:  143mL OMNIPAQUE IOHEXOL 350 MG/ML SOLN COMPARISON:  plain CT at 1926 hours today. FINDINGS: CT Brain Perfusion Findings: ASPECTS: 10 (vague hyperdense area left operculum) CBF (<30%) Volume: None Perfusion (Tmax>6.0s) volume: None Mismatch Volume: None Infarction Location:None Other findings: Focally increased CBV and CBF in the same area of vague hyperdensity by plain CT. CTA NECK Skeleton: Chronic degenerative and/or posttraumatic changes to the right TMJ. Lower cervical disc, endplate and facet degeneration. No acute osseous abnormality identified. Degeneration Upper chest: Negative. Other neck: Thyroid size at the upper limits of normal, no definite discrete thyroid nodule. No acute soft tissue findings identified in the neck. Aortic arch: 3 vessel arch configuration with no arch atherosclerosis. Right carotid system: Negative right CCA and right carotid bifurcation. There is a prominent beaded appearance of the distal cervical right ICA (series 9, image 15) in keeping with Fibromuscular Dysplasia (FMD). No stenosis. Left carotid system: Negative left CCA and left carotid bifurcation. Minimal if any luminal irregularity of the cervical left ICA and no stenosis. Vertebral arteries: Normal proximal  right subclavian artery, right vertebral artery origin. The right vertebral appears mildly non dominant and is patent to the skull base without plaque or stenosis. Normal proximal left subclavian artery and left vertebral artery origin. The left vertebral is mildly dominant and patent to the skull base without plaque or stenosis. CTA HEAD Posterior circulation: Dominant left V4 segment. Patent PICA origins. Patent vertebrobasilar junction. Patent basilar artery. Fetal type right PCA origin. SCA and left PCA origins within normal limits. Left posterior communicating artery diminutive or absent. Bilateral PCA branches are within normal limits. Anterior circulation: Both ICA siphons  are patent. On the left there is no plaque or stenosis. The right siphon also appears normal. Normal ophthalmic and right posterior communicating artery origins. Patent carotid termini. Normal MCA and ACA origins. Anterior communicating artery and bilateral ACA branches are within normal limits. Right MCA M1 segment and bifurcation are patent without stenosis. Right MCA branches are within normal limits. Left MCA M1 segment and bifurcation are also patent without stenosis. Left MCA branches appear within normal limits. But there are asymmetric vessels which seem to be an increase in cortical venous drainage about the left operculum (series 7, image 19). See also series 8, image 19. There is no AVM nidus identified and no abnormally enlarged arterial supply to the region. Elsewhere intracranial vascularity appears symmetric and within normal limits. Venous sinuses: Patent. Anatomic variants: Fetal type right PCA origin. Review of the MIP images confirms the above findings IMPRESSION: 1. Negative for LVO and CTP detects no ischemia or infarct. 2. Positive for hyperemia in the left frontal operculum corresponding to the area of vague hyperdensity by plain CT. No discrete AVM and no enhancement typical of meningioma identified. Other top  considerations include sequelae of seizure and poorly enhancing tumor (such as a low to intermediate grade glioma). 3. Preliminary report of the above was discussed by telephone with Dr. Kerney Elbe on 05/30/2019 at 1956 hours. MRI is pending. 4. Additionally, there is cervical Right ICA Fibromuscular Dysplasia (FMD). But no atherosclerosis or stenosis in the head or neck. Electronically Signed   By: Genevie Ann M.D.   On: 05/30/2019 20:16   CT Code Stroke CTA Neck W/WO contrast  Result Date: 05/30/2019 CLINICAL DATA:  54 year old female code stroke presentation with aphasia. Vague hyperdensity left temporal operculum on plain CT at 1926 hours today. EXAM: CT ANGIOGRAPHY HEAD AND NECK CT PERFUSION BRAIN TECHNIQUE: Multidetector CT imaging of the head and neck was performed using the standard protocol during bolus administration of intravenous contrast. Multiplanar CT image reconstructions and MIPs were obtained to evaluate the vascular anatomy. Carotid stenosis measurements (when applicable) are obtained utilizing NASCET criteria, using the distal internal carotid diameter as the denominator. Multiphase CT imaging of the brain was performed following IV bolus contrast injection. Subsequent parametric perfusion maps were calculated using RAPID software. CONTRAST:  120mL OMNIPAQUE IOHEXOL 350 MG/ML SOLN COMPARISON:  plain CT at 1926 hours today. FINDINGS: CT Brain Perfusion Findings: ASPECTS: 10 (vague hyperdense area left operculum) CBF (<30%) Volume: None Perfusion (Tmax>6.0s) volume: None Mismatch Volume: None Infarction Location:None Other findings: Focally increased CBV and CBF in the same area of vague hyperdensity by plain CT. CTA NECK Skeleton: Chronic degenerative and/or posttraumatic changes to the right TMJ. Lower cervical disc, endplate and facet degeneration. No acute osseous abnormality identified. Degeneration Upper chest: Negative. Other neck: Thyroid size at the upper limits of normal, no definite  discrete thyroid nodule. No acute soft tissue findings identified in the neck. Aortic arch: 3 vessel arch configuration with no arch atherosclerosis. Right carotid system: Negative right CCA and right carotid bifurcation. There is a prominent beaded appearance of the distal cervical right ICA (series 9, image 15) in keeping with Fibromuscular Dysplasia (FMD). No stenosis. Left carotid system: Negative left CCA and left carotid bifurcation. Minimal if any luminal irregularity of the cervical left ICA and no stenosis. Vertebral arteries: Normal proximal right subclavian artery, right vertebral artery origin. The right vertebral appears mildly non dominant and is patent to the skull base without plaque or stenosis. Normal proximal left subclavian artery and  left vertebral artery origin. The left vertebral is mildly dominant and patent to the skull base without plaque or stenosis. CTA HEAD Posterior circulation: Dominant left V4 segment. Patent PICA origins. Patent vertebrobasilar junction. Patent basilar artery. Fetal type right PCA origin. SCA and left PCA origins within normal limits. Left posterior communicating artery diminutive or absent. Bilateral PCA branches are within normal limits. Anterior circulation: Both ICA siphons are patent. On the left there is no plaque or stenosis. The right siphon also appears normal. Normal ophthalmic and right posterior communicating artery origins. Patent carotid termini. Normal MCA and ACA origins. Anterior communicating artery and bilateral ACA branches are within normal limits. Right MCA M1 segment and bifurcation are patent without stenosis. Right MCA branches are within normal limits. Left MCA M1 segment and bifurcation are also patent without stenosis. Left MCA branches appear within normal limits. But there are asymmetric vessels which seem to be an increase in cortical venous drainage about the left operculum (series 7, image 19). See also series 8, image 19. There is no  AVM nidus identified and no abnormally enlarged arterial supply to the region. Elsewhere intracranial vascularity appears symmetric and within normal limits. Venous sinuses: Patent. Anatomic variants: Fetal type right PCA origin. Review of the MIP images confirms the above findings IMPRESSION: 1. Negative for LVO and CTP detects no ischemia or infarct. 2. Positive for hyperemia in the left frontal operculum corresponding to the area of vague hyperdensity by plain CT. No discrete AVM and no enhancement typical of meningioma identified. Other top considerations include sequelae of seizure and poorly enhancing tumor (such as a low to intermediate grade glioma). 3. Preliminary report of the above was discussed by telephone with Dr. Kerney Elbe on 05/30/2019 at 1956 hours. MRI is pending. 4. Additionally, there is cervical Right ICA Fibromuscular Dysplasia (FMD). But no atherosclerosis or stenosis in the head or neck. Electronically Signed   By: Genevie Ann M.D.   On: 05/30/2019 20:16   MR BRAIN W WO CONTRAST  Result Date: 05/30/2019 CLINICAL DATA:  54 year old female code stroke presentation with aphasia. Abnormal hyperemia of the left operculum on CTA/CTP. Vague increased hyperdensity there on plain CT. EXAM: MRI HEAD WITHOUT AND WITH CONTRAST TECHNIQUE: Multiplanar, multiecho pulse sequences of the brain and surrounding structures were obtained without and with intravenous contrast. CONTRAST:  5.25mL GADAVIST GADOBUTROL 1 MMOL/ML IV SOLN COMPARISON:  CT head, CTA and CTP earlier today. FINDINGS: Brain: Abnormal signal in the left insula and temporal operculum which is primarily limited to the left superior temporal gyrus. There is gyral expansion. See series 11, image 13 and series 17, image 15. On DWI there is gyriform increased trace signal but no convincing restricted diffusion, and some of the abnormal cortex is facilitated. No mineralization or blood products. The left deep gray nuclei appear spared, but similar  abnormal gyriform signal and expansion tracks posteriorly through the left temporal lobe toward the lateral occipital lobe as far as coronal series 17, image 10 (FLAIR series 11, image 10), although this seems somewhat discontinuous with the epicenter. Following contrast, there is only trace crescentic enhancement which corresponds to the subcortical white matter of the affected superior temporal gyrus (series 20, image 21). No other abnormal enhancement. No leptomeningeal or dural thickening identified. There is trace rightward midline shift. No ventriculomegaly. No restricted diffusion to suggest acute infarction. No extra-axial collection or acute intracranial hemorrhage. Cervicomedullary junction and pituitary are within normal limits. Incidental anterior right frontal lobe developmental venous anomaly (normal variant).  Negative pituitary and cervicomedullary junction. The contralateral right hemisphere and the brainstem appear normal. Vascular: Major intracranial vascular flow voids are preserved. No abnormal flow voids. The major dural venous sinuses are enhancing and appear to be patent. Skull and upper cervical spine: Heterogeneous bone marrow signal although no destructive osseous lesion identified. Sinuses/Orbits: Negative orbits. Paranasal Visualized paranasal sinuses and mastoids are stable and well pneumatized. Other: Visible internal auditory structures appear normal. Scalp and face soft tissues appear negative. IMPRESSION: 1. Multifocal abnormally T2/FLAIR hyperintense and expanded gyri with epicenter at the left insula and operculum. Somewhat discontinuous involvement which tracks posteriorly toward the lateral left occiput. Only trace associated enhancement in the left superior temporal gyrus. No leptomeningeal or dural involvement. No diffusion restriction or hemorrhage. Mild intracranial mass effect, with trace rightward midline shift. 2. The top differential considerations are encephalitis  (including an autoimmune encephalitis), an infiltrative primary tumor (including gliomatosis - although the elevated CBV/CBF may argue against a tumor with so little enhancement), and postictal edema. Study discussed by telephone with Dr. Kerney Elbe on 05/30/2019 at 21:31. It seems that clinical signs and symptoms currently favor seizure. Currently he is planning EEG, LP, and a repeat MRI in one week. Electronically Signed   By: Genevie Ann M.D.   On: 05/30/2019 21:36   CT Code Stroke Cerebral Perfusion with contrast  Result Date: 05/30/2019 CLINICAL DATA:  54 year old female code stroke presentation with aphasia. Vague hyperdensity left temporal operculum on plain CT at 1926 hours today. EXAM: CT ANGIOGRAPHY HEAD AND NECK CT PERFUSION BRAIN TECHNIQUE: Multidetector CT imaging of the head and neck was performed using the standard protocol during bolus administration of intravenous contrast. Multiplanar CT image reconstructions and MIPs were obtained to evaluate the vascular anatomy. Carotid stenosis measurements (when applicable) are obtained utilizing NASCET criteria, using the distal internal carotid diameter as the denominator. Multiphase CT imaging of the brain was performed following IV bolus contrast injection. Subsequent parametric perfusion maps were calculated using RAPID software. CONTRAST:  182mL OMNIPAQUE IOHEXOL 350 MG/ML SOLN COMPARISON:  plain CT at 1926 hours today. FINDINGS: CT Brain Perfusion Findings: ASPECTS: 10 (vague hyperdense area left operculum) CBF (<30%) Volume: None Perfusion (Tmax>6.0s) volume: None Mismatch Volume: None Infarction Location:None Other findings: Focally increased CBV and CBF in the same area of vague hyperdensity by plain CT. CTA NECK Skeleton: Chronic degenerative and/or posttraumatic changes to the right TMJ. Lower cervical disc, endplate and facet degeneration. No acute osseous abnormality identified. Degeneration Upper chest: Negative. Other neck: Thyroid size at the  upper limits of normal, no definite discrete thyroid nodule. No acute soft tissue findings identified in the neck. Aortic arch: 3 vessel arch configuration with no arch atherosclerosis. Right carotid system: Negative right CCA and right carotid bifurcation. There is a prominent beaded appearance of the distal cervical right ICA (series 9, image 15) in keeping with Fibromuscular Dysplasia (FMD). No stenosis. Left carotid system: Negative left CCA and left carotid bifurcation. Minimal if any luminal irregularity of the cervical left ICA and no stenosis. Vertebral arteries: Normal proximal right subclavian artery, right vertebral artery origin. The right vertebral appears mildly non dominant and is patent to the skull base without plaque or stenosis. Normal proximal left subclavian artery and left vertebral artery origin. The left vertebral is mildly dominant and patent to the skull base without plaque or stenosis. CTA HEAD Posterior circulation: Dominant left V4 segment. Patent PICA origins. Patent vertebrobasilar junction. Patent basilar artery. Fetal type right PCA origin. SCA and left PCA  origins within normal limits. Left posterior communicating artery diminutive or absent. Bilateral PCA branches are within normal limits. Anterior circulation: Both ICA siphons are patent. On the left there is no plaque or stenosis. The right siphon also appears normal. Normal ophthalmic and right posterior communicating artery origins. Patent carotid termini. Normal MCA and ACA origins. Anterior communicating artery and bilateral ACA branches are within normal limits. Right MCA M1 segment and bifurcation are patent without stenosis. Right MCA branches are within normal limits. Left MCA M1 segment and bifurcation are also patent without stenosis. Left MCA branches appear within normal limits. But there are asymmetric vessels which seem to be an increase in cortical venous drainage about the left operculum (series 7, image 19). See  also series 8, image 19. There is no AVM nidus identified and no abnormally enlarged arterial supply to the region. Elsewhere intracranial vascularity appears symmetric and within normal limits. Venous sinuses: Patent. Anatomic variants: Fetal type right PCA origin. Review of the MIP images confirms the above findings IMPRESSION: 1. Negative for LVO and CTP detects no ischemia or infarct. 2. Positive for hyperemia in the left frontal operculum corresponding to the area of vague hyperdensity by plain CT. No discrete AVM and no enhancement typical of meningioma identified. Other top considerations include sequelae of seizure and poorly enhancing tumor (such as a low to intermediate grade glioma). 3. Preliminary report of the above was discussed by telephone with Dr. Kerney Elbe on 05/30/2019 at 1956 hours. MRI is pending. 4. Additionally, there is cervical Right ICA Fibromuscular Dysplasia (FMD). But no atherosclerosis or stenosis in the head or neck. Electronically Signed   By: Genevie Ann M.D.   On: 05/30/2019 20:16   Overnight EEG with video  Result Date: 06/01/2019 Lora Havens, MD     06/01/2019 12:20 PM Patient Name: AKESHIA MINCEY MRN: [id number] Epilepsy Attending: Lora Havens Referring Physician/Provider: Dr  Zeb Comfort Duration: 05/31/2019 1238 to 06/01/2019 1042  Patient history: 54 y.o.femalepresenting with acute onset of right sided paresthesia and expressive/receptive aphasia. EEG to evaluate for seizure.  Level of alertness: awake, asleep  AEDs during EEG study: LEV  Technical aspects: This EEG study was done with scalp electrodes positioned according to the 10-20 International system of electrode placement. Electrical activity was acquired at a sampling rate of 500Hz  and reviewed with a high frequency filter of 70Hz  and a low frequency filter of 1Hz . EEG data were recorded continuously and digitally stored.  DESCRIPTION:  The posterior dominant rhythm consists of 9 Hz activity of  moderate voltage (25-35 uV) seen predominantly in posterior head regions, symmetric and reactive to eye opening and eye closing. EEG showed intermittent 2-3Hz delta slowing in left frontal-anterior temporal region which at times appeared rhythmic. Sharp waves were also noted in left frontotemporal region which at times (predominantly in sleep) appeared periodic at 0.5 to 1 Hz.  Hyperventilation and photic stimulation were not performed.  ABNORMALITY -Sharp waves, left frontotemporal region -Intermittent rhythmic delta activity, left frontal-anterior temporal region  IMPRESSION: This study is suggestive of epileptogenicity and cortical dysfunction in left frontotemporal region likely secondary to underlying cortical lesion.  At times, rhythmic delta activity was seen in left frontal-anterior temporal region which is on the ictal-interictal continuum. Lora Havens   CT HEAD CODE STROKE WO CONTRAST  Addendum Date: 05/30/2019   ADDENDUM REPORT: 05/30/2019 20:17 ADDENDUM: Study was discussed by telephone with Dr. Cheral Marker at 1942 hours. Electronically Signed   By: Herminio Heads.D.  On: 05/30/2019 20:17   Result Date: 05/30/2019 CLINICAL DATA:  Code stroke. 54 year old female with right side tingling/paresthesia and abnormal speech. EXAM: CT HEAD WITHOUT CONTRAST TECHNIQUE: Contiguous axial images were obtained from the base of the skull through the vertex without intravenous contrast. COMPARISON:  None. FINDINGS: Brain: Normal cerebral volume. No midline shift and basilar cisterns are patent. But there is a vague asymmetric area of mild hyperdensity associated with the left superior temporal gyrus or temporal operculum on series 3, image 11. This measures about 20 mm. No regional cerebral edema. Otherwise gray-white matter differentiation appears symmetric and within normal limits. No ventriculomegaly. No changes of acute cortically based infarct identified. Vascular: No suspicious intracranial vascular  hyperdensity. Skull: Negative. Sinuses/Orbits: Visualized paranasal sinuses and mastoids are clear. Other: Visualized orbit soft tissues are within normal limits. Visualized scalp soft tissues are within normal limits. ASPECTS [facility] Stroke Program Early CT Score) Total score (0-10 with 10 being normal): 10 IMPRESSION: 1. Vague asymmetric hyperdensity in a 20 mm area of the left temporal operculum. This is nonspecific but might be a meningioma. Acute intracranial hemorrhage is difficult to exclude. MRI without and with contrast would best characterize further when feasible. 2. No superimposed CT changes of acute cortically based infarct identified. ASPECTS 10. These results were communicated to Dr. Cheral Marker at 7:32 pm on 05/30/2019 by text page via the [facility] messaging system. Electronically Signed: By: Genevie Ann M.D. On: 05/30/2019 19:33    Medications:   . sodium chloride flush  3 mL Intravenous Q12H   Continuous Infusions: . levETIRAcetam       LOS: 1 day   Lucky Cowboy MD [facility]   How to contact the [facility] Attending or Consulting provider [place] or covering provider during after hours [place], for this patient?  1. Check the care team in [facility] and look for a) attending/consulting [facility] provider listed and b) the [facility] team listed 2. Log into [email or web address] and use [place]'s universal password to access. If you do not have the password, please contact the hospital operator. 3. Locate the [facility] provider you are looking for under [facility] and page to a number that you can be directly reached. 4. If you still have difficulty reaching the provider, please page the [facility] (Director on Call) for the Hospitalists listed on amion for assistance.  06/01/2019, 3:48 PM

## 2019-06-01 NOTE — Progress Notes (Signed)
Subjective: No acute events overnight.  Patient and her husband state she has been improving, is able to follow commands, is more alert and oriented to time place person.  However still not back to baseline.  Specifically concerned about paresthesias in right lower extremity which are near constant.  Patient also states that she feels like her mood is more labile.  Also patient's husband states patient had a tick bite about 2 years ago and expressed concern if that could somehow be related to the current episode.  ROS: negative except above  Examination  Vital signs in last 24 hours: Temp:  [97.8 F (36.6 C)-99 F (37.2 C)] 98.9 F (37.2 C) (02/23 0857) Pulse Rate:  [60-84] 84 (02/23 0857) Resp:  [10-18] 17 (02/23 0857) BP: (108-128)/(62-75) 118/75 (02/23 0857) SpO2:  [99 %-100 %] 100 % (02/23 0857)  General: laying in bed, not in apparent distress CVS: pulse-normal rate and rhythm RS: breathing comfortably, no accessory muscles of respiration used Extremities: normal, warm  Neuro: MS: Alert, oriented to time, place, person, follows commands CN: pupils equal and reactive,  EOMI, face symmetric, tongue midline Motor: Voluntarily moving all 4 extremities in bed   Basic Metabolic Panel: Recent Labs  Lab 05/30/19 1906 05/30/19 1929 05/30/19 2111 05/31/19 0226 06/01/19 0402  NA 138 139  --  142 142  K 3.7 3.6  --  3.7 3.7  CL 102 103  --  109 112*  CO2 23  --   --  23 23  GLUCOSE 120* 113*  --  99 91  BUN 14 15  --  8 9  CREATININE 0.85 0.60  --  0.63 0.67  CALCIUM 9.6  --   --  8.3* 8.2*  MG  --   --  1.7  --  1.7    CBC: Recent Labs  Lab 05/30/19 1906 05/30/19 1929 05/31/19 0226  WBC 8.1  --  8.3  NEUTROABS 6.4  --  6.1  HGB 14.2 15.0 12.2  HCT 44.3 44.0 37.6  MCV 97.6  --  98.4  PLT 244  --  209     Coagulation Studies: Recent Labs    05/30/19 1906  LABPROT 13.9  INR 1.1    Imaging No new brain imaging    ASSESSMENT AND PLAN: 54  y.o.femalepresenting with acute onset of right sided paresthesiasandexpressive/receptiveaphasia.  Stat CT head was not consistent with stroke.  CT perfusion showed hyperemia in left frontal operculum and MRI brain showed abnormal restricted diffusion and T2 flair abnormality in left frontal operculum, insular region.  This constellation of findings are concerning for seizures due to primary underlying tumor, meningitis/encephalitis.   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  Cryptococcal antigen negative.  HSV, varicella, fungal culture, oligoclonal bands, IgG CSF index pending  Recommendations - LTM EEG showed intermittent rhythmic delta activity in the left temporal region which can sometimes be ictal.  Patient also reports near constant paresthesias in the right lower extremity likely secondary to the left insular lesion.  We will discontinue LTM EEG as no overt seizures seen on EEG and clinically manage symptoms at this point. -Increase Keppra to 1500 mg twice daily. If paresthesias persist this evening, will consider increasing to 2000 mg twice daily. -Discussed that it is less likely that this episode is related to tick bite  which happened about 2 years ago. I would be more concerned about an autoimmune or paraneoplastic etiology.  -Continue seizure precautions -As needed IV Ativan 2 mg for GTC lasting more than 2 minutes of focal seizure lasting more than 5 minutes -Management of rest of the comorbidities per primary team   I have spent a total of 35 minuteswith the patient reviewing hospitalnotes,  test results, labs and examining the patient as well as establishing an assessment and plan that was discussed personally with the  patient and her husband.>50% of time was spent in direct patient care.

## 2019-06-02 ENCOUNTER — Inpatient Hospital Stay (HOSPITAL_COMMUNITY): Payer: 59

## 2019-06-02 LAB — IGG CSF INDEX
Albumin CSF-mCnc: 20 mg/dL (ref 11–48)
Albumin: 4 g/dL (ref 3.8–4.9)
CSF IgG Index: 0.4 (ref 0.0–0.7)
IgG (Immunoglobin G), Serum: 992 mg/dL (ref 586–1602)
IgG, CSF: 2.2 mg/dL (ref 0.0–8.6)
IgG/Alb Ratio, CSF: 0.11 (ref 0.00–0.25)

## 2019-06-02 MED ORDER — IOHEXOL 300 MG/ML  SOLN
100.0000 mL | Freq: Once | INTRAMUSCULAR | Status: AC | PRN
  Administered 2019-06-02: 100 mL via INTRAVENOUS

## 2019-06-02 MED ORDER — LEVETIRACETAM 750 MG PO TABS
2000.0000 mg | ORAL_TABLET | Freq: Two times a day (BID) | ORAL | Status: DC
  Administered 2019-06-02 – 2019-06-03 (×2): 2000 mg via ORAL
  Filled 2019-06-02: qty 1
  Filled 2019-06-02: qty 2

## 2019-06-02 MED ORDER — LEVETIRACETAM IN NACL 500 MG/100ML IV SOLN
500.0000 mg | Freq: Once | INTRAVENOUS | Status: AC
  Administered 2019-06-02: 500 mg via INTRAVENOUS
  Filled 2019-06-02: qty 100

## 2019-06-02 NOTE — Progress Notes (Signed)
PROGRESS NOTE    Kristen Foley  [id number] DOB: 05/21/1965 DOA: 05/30/2019 PCP: Unk Pinto, MD    Brief Narrative:  54 y.o. female with a past medical history that includes vitamin D deficiency, hyperlipidemia presented to the emergency department February 21 chief complaint tingling of right leg and speech disturbance.  Also complained of an episode where she seemed to stare off and not answer questions or respond for several seconds.  Work-up includes CT perfusion showed abnormal restricted diffusion and T2 flair abnormality in left frontal operculum, insular region. Neurology on board. MRI brain showed multifocal abnormally T2/FLAIR hyperintense and expanded gyri with epicenter at the left insula and operculum. Started on keppra for new onset seizures  Assessment & Plan:   Principal Problem:   Seizures ([place]) Active Problems:   Numbness and tingling of right leg   Speech disturbance   Transient alteration of awareness   Hypomagnesemia  Right-sided paresthesias, speech disturbance, alteration in awareness  New onset seizures Infectious vs autoimmune encephalitis vs primary underlying tumor  - Neurology following - Abnormal CT perfusion study/ MRI brain as noted above - LTM EEG abnormal. Discontinued by neuro - keppra dose increased to 2gm today per Neruology - CSF studies: colorless, cell count 1637 RBCs (tube 4), WBC 3 predominant PMN, protein normal, gram stain no organisms, culture NGTD, cryptococcal Ag negative; VZV PCR, HSV PCR, IgG CSF index, oligoclonal bands, fungal culture pending - Patient concerned about possible lyme's disease given h/o tick bite in 2019 that was treated with doxycycline. Less likely related to lyme per neurology - HIV negative  -Continue with PRN ativan as per neuro recommendations  Hypomagnesemia - replace IV as needed   DVT prophylaxis: SCD's Code Status: Full Family Communication: Pt in room, family not at bedside Disposition Plan:  From home, likely d/c home 2/25 if stable and cleared by Neurology  Consultants:   Neurology  Procedures:     Antimicrobials: Anti-infectives (From admission, onward)   None       Subjective: Reports RLE parasthesias improved  Objective: Vitals:   06/02/19 0800 06/02/19 0842 06/02/19 1146 06/02/19 1607  BP: (!) 144/74 123/73 136/83 105/82  Pulse: 66 79 62 72  Resp: 12 12 13 18   Temp: 98.5 F (36.9 C) 98.5 F (36.9 C) 98.8 F (37.1 C) 98.1 F (36.7 C)  TempSrc: Oral Oral Oral Oral  SpO2: 99% 100% 100% 100%  Weight:      Height:        Intake/Output Summary (Last 24 hours) at 06/02/2019 1644 Last data filed at 06/02/2019 1230 Gross per 24 hour  Intake 100 ml  Output 500 ml  Net -400 ml   Filed Weights   05/30/19 1909 05/30/19 1915 05/31/19 0108  Weight: 48 kg 52.4 kg 49.5 kg    Examination:  General exam: Appears calm and comfortable  Respiratory system: Clear to auscultation. Respiratory effort normal. Cardiovascular system: S1 & S2 heard, Regular Gastrointestinal system: Abdomen is nondistended, soft and nontender. No organomegaly or masses felt. Normal bowel sounds heard. Central nervous system: Alert and oriented. No focal neurological deficits. Extremities: Symmetric 5 x 5 power. Skin: No rashes, lesions  Psychiatry: Judgement and insight appear normal. Mood & affect appropriate.   Data Reviewed: I have personally reviewed following labs and imaging studies  CBC: Recent Labs  Lab 05/30/19 1906 05/30/19 1929 05/31/19 0226  WBC 8.1  --  8.3  NEUTROABS 6.4  --  6.1  HGB 14.2 15.0 12.2  HCT 44.3 44.0  37.6  MCV 97.6  --  98.4  PLT 244  --  [id number]   Basic Metabolic Panel: Recent Labs  Lab 05/30/19 1906 05/30/19 1929 05/30/19 2111 05/31/19 0226 06/01/19 0402  NA 138 139  --  142 142  K 3.7 3.6  --  3.7 3.7  CL 102 103  --  109 112*  CO2 23  --   --  23 23  GLUCOSE 120* 113*  --  99 91  BUN 14 15  --  8 9  CREATININE 0.85 0.60  --  0.63  0.67  CALCIUM 9.6  --   --  8.3* 8.2*  MG  --   --  1.7  --  1.7   GFR: Estimated Creatinine Clearance: 65 mL/min (by C-G formula based on SCr of 0.67 mg/dL). Liver Function Tests: Recent Labs  Lab 05/30/19 1906 05/30/19 2241 05/31/19 0226 06/01/19 0402  AST 26  --  18 15  ALT 17  --  13 12  ALKPHOS 54  --  42 39  BILITOT 0.6  --  1.0 0.8  PROT 7.4  --  5.6* 5.0*  ALBUMIN 4.4 4.0 3.3* 2.9*   No results for input(s): LIPASE, AMYLASE in the last 168 hours. No results for input(s): AMMONIA in the last 168 hours. Coagulation Profile: Recent Labs  Lab 05/30/19 1906  INR 1.1   Cardiac Enzymes: No results for input(s): CKTOTAL, CKMB, CKMBINDEX, TROPONINI in the last 168 hours. BNP (last 3 results) No results for input(s): PROBNP in the last 8760 hours. HbA1C: No results for input(s): HGBA1C in the last 72 hours. CBG: Recent Labs  Lab 05/30/19 1928  GLUCAP 110*   Lipid Profile: No results for input(s): CHOL, HDL, LDLCALC, TRIG, CHOLHDL, LDLDIRECT in the last 72 hours. Thyroid Function Tests: No results for input(s): TSH, T4TOTAL, FREET4, T3FREE, THYROIDAB in the last 72 hours. Anemia Panel: No results for input(s): VITAMINB12, FOLATE, FERRITIN, TIBC, IRON, RETICCTPCT in the last 72 hours. Sepsis Labs: No results for input(s): PROCALCITON, LATICACIDVEN in the last 168 hours.  Recent Results (from the past 240 hour(s))  CSF culture     Status: None (Preliminary result)   Collection Time: 05/31/19 12:00 AM   Specimen: CSF; Cerebrospinal Fluid  Result Value Ref Range Status   Specimen Description CSF  Final   Special Requests NO 2  Final   Gram Stain   Final    WBC PRESENT, PREDOMINANTLY PMN NO ORGANISMS SEEN CYTOSPIN SMEAR    Culture   Final    NO GROWTH 2 DAYS Performed at [facility], [address], [place], [place] [address]    Report Status PENDING  Incomplete  Fungus Culture With Stain     Status: None (Preliminary result)   Collection Time:  05/31/19 12:00 AM   Specimen: Nasopharyngeal Swab  Result Value Ref Range Status   Fungus Stain Final report  Final    Comment: (NOTE) Performed At: [facility] [place], [place] [id number] Rush Farmer MD [id number]    Fungus (Mycology) Culture PENDING  Incomplete   Fungal Source CSF  Final  Fungus Culture Result     Status: None   Collection Time: 05/31/19 12:00 AM  Result Value Ref Range Status   Result 1 Comment  Final    Comment: (NOTE) KOH/Calcofluor preparation:  no fungus observed. Performed At: [facility] [place], [place] [id number] Rush Farmer MD [id number]   SARS CORONAVIRUS 2 (TAT 6-24  HRS) Nasopharyngeal Nasopharyngeal Swab     Status: None   Collection Time: 05/31/19 12:16 AM   Specimen: Nasopharyngeal Swab  Result Value Ref Range Status   SARS Coronavirus 2 NEGATIVE NEGATIVE Final    Comment: (NOTE) SARS-CoV-2 target nucleic acids are NOT DETECTED. The SARS-CoV-2 RNA is generally detectable in upper and lower respiratory specimens during the acute phase of infection. Negative results do not preclude SARS-CoV-2 infection, do not rule out co-infections with other pathogens, and should not be used as the sole basis for treatment or other patient management decisions. Negative results must be combined with clinical observations, patient history, and epidemiological information. The expected result is Negative. Fact Sheet for Patients: SugarRoll.be Fact Sheet for Healthcare Providers: https://www.woods-mathews.com/ This test is not yet approved or cleared by the [place] FDA and  has been authorized for detection and/or diagnosis of SARS-CoV-2 by FDA under an Emergency Use Authorization (EUA). This EUA will remain  in effect (meaning this test can be used) for the duration of the COVID-19 declaration under Section 56 4(b)(1) of the Act, 21 U.S.C. section  360bbb-3(b)(1), unless the authorization is terminated or revoked sooner. Performed at [facility], [place] [address] [place], [place] [address]      Radiology Studies: Overnight EEG with video  Result Date: 06/01/2019 Lora Havens, MD     06/01/2019 12:20 PM Patient Name: Kristen Foley MRN: [id number] Epilepsy Attending: Lora Havens Referring Physician/Provider: Dr  Zeb Comfort Duration: 05/31/2019 1238 to 06/01/2019 1042  Patient history: 54 y.o.femalepresenting with acute onset of right sided paresthesia and expressive/receptive aphasia. EEG to evaluate for seizure.  Level of alertness: awake, asleep  AEDs during EEG study: LEV  Technical aspects: This EEG study was done with scalp electrodes positioned according to the 10-20 International system of electrode placement. Electrical activity was acquired at a sampling rate of 500Hz  and reviewed with a high frequency filter of 70Hz  and a low frequency filter of 1Hz . EEG data were recorded continuously and digitally stored.  DESCRIPTION:  The posterior dominant rhythm consists of 9 Hz activity of moderate voltage (25-35 uV) seen predominantly in posterior head regions, symmetric and reactive to eye opening and eye closing. EEG showed intermittent 2-3Hz delta slowing in left frontal-anterior temporal region which at times appeared rhythmic. Sharp waves were also noted in left frontotemporal region which at times (predominantly in sleep) appeared periodic at 0.5 to 1 Hz.  Hyperventilation and photic stimulation were not performed.  ABNORMALITY -Sharp waves, left frontotemporal region -Intermittent rhythmic delta activity, left frontal-anterior temporal region  IMPRESSION: This study is suggestive of epileptogenicity and cortical dysfunction in left frontotemporal region likely secondary to underlying cortical lesion.  At times, rhythmic delta activity was seen in left frontal-anterior temporal region which is on the ictal-interictal  continuum. Priyanka Barbra Sarks    Scheduled Meds: . levETIRAcetam  2,000 mg Oral BID  . sodium chloride flush  3 mL Intravenous Q12H   Continuous Infusions: . sodium chloride       LOS: 2 days   Marylu Lund, MD [facility] Pager On Amion  If 7PM-7AM, please contact night-coverage 06/02/2019, 4:44 PM

## 2019-06-02 NOTE — Progress Notes (Signed)
Subjective: No acute events overnight.  States the paresthesias in her lower right leg have significant improved but are still present in the right foot.    ROS: negative except above  Examination  Vital signs in last 24 hours: Temp:  [98.1 F (36.7 C)-98.9 F (37.2 C)] 98.8 F (37.1 C) (02/24 1146) Pulse Rate:  [60-79] 62 (02/24 1146) Resp:  [12-18] 13 (02/24 1146) BP: (118-144)/(72-89) 136/83 (02/24 1146) SpO2:  [90 %-100 %] 100 % (02/24 1146)  General: lying in bed, not in apparent distress CVS: pulse-normal rate and rhythm RS: breathing comfortably Extremities: normal, warm  Neuro: MS: : Alert, oriented to time, place, person, follows commands CN: pupils equal and reactive,  EOMI, face symmetric, tongue midline Motor: Voluntarily moving all 4 extremities in bed   Basic Metabolic Panel: Recent Labs  Lab 05/30/19 1906 05/30/19 1929 05/30/19 2111 05/31/19 0226 06/01/19 0402  NA 138 139  --  142 142  K 3.7 3.6  --  3.7 3.7  CL 102 103  --  109 112*  CO2 23  --   --  23 23  GLUCOSE 120* 113*  --  99 91  BUN 14 15  --  8 9  CREATININE 0.85 0.60  --  0.63 0.67  CALCIUM 9.6  --   --  8.3* 8.2*  MG  --   --  1.7  --  1.7    CBC: Recent Labs  Lab 05/30/19 1906 05/30/19 1929 05/31/19 0226  WBC 8.1  --  8.3  NEUTROABS 6.4  --  6.1  HGB 14.2 15.0 12.2  HCT 44.3 44.0 37.6  MCV 97.6  --  98.4  PLT 244  --  209     Coagulation Studies: Recent Labs    05/30/19 1906  LABPROT 13.9  INR 1.1    Imaging No new brain imaging    ASSESSMENT AND PLAN: 54 y.o.femalepresenting with acute onset of right sided paresthesiasandexpressive/receptiveaphasia.Stat CT head was not consistent with stroke. CT perfusion showed hyperemia in left frontal operculum and MRI brain showed abnormal restricted diffusion and T2 flair abnormality in left frontal operculum, insular region. This constellation of findings are concerning for seizures due to primary underlying tumor,  meningitis/encephalitis.   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 Cryptococcal antigen negative. HSV and varicella PCR negative,fungal culture, oligoclonal bands, IgG CSF index pending  Recommendations -Increase Keppra to 2000 mg twice daily as patient continues to have right foot paresthesias -We will obtain CT chest and CT Abdo pelvis with contrast to look for any neoplasm as part of paraneoplastic etiology -We will send [facility] panel for autoimmune and paraneoplastic work-up on CSF -We will repeat MRI brain in 2 to 3 weeks after discharge to look for resolution of acute changes -Continue seizure precautions -As needed IV Ativan 2 mg for GTC lasting more than 2 minutes of focal seizure lasting more than 5 minutes -Follow-up with neurology in 6 to 8 weeks after discharge.  I have spent a total of30minuteswith the patient reviewing hospitalnotes, test results, labs and examining the patient as well as establishing an assessment and plan that was discussed personally with the patient and her husband.>50% of time was spent in direct patient care.  Breandan People Barbra Sarks

## 2019-06-02 NOTE — Progress Notes (Signed)
I concur with the assessment implemented and entered by Hillard Danker, SN.

## 2019-06-03 ENCOUNTER — Inpatient Hospital Stay (HOSPITAL_COMMUNITY): Payer: 59

## 2019-06-03 DIAGNOSIS — R569 Unspecified convulsions: Secondary | ICD-10-CM

## 2019-06-03 LAB — CSF CULTURE W GRAM STAIN: Culture: NO GROWTH

## 2019-06-03 LAB — HSV CULTURE AND TYPING

## 2019-06-03 LAB — OLIGOCLONAL BANDS, CSF + SERM

## 2019-06-03 MED ORDER — LEVETIRACETAM 1000 MG PO TABS: 2000.0000 mg | ORAL_TABLET | Freq: Two times a day (BID) | ORAL | 0 refills | Status: DC

## 2019-06-03 NOTE — Discharge Summary (Signed)
Physician Discharge Summary  Kristen Foley [id number] DOB: November 22, 1965 DOA: 05/30/2019  PCP: Unk Pinto, MD  Admit date: 05/30/2019 Discharge date: 06/03/2019  Admitted From: Home Disposition:  Home  Recommendations for Outpatient Follow-up:  1. Follow up with PCP in 2-3 weeks 2. Follow up with Neurology as scheduled in 6-8 weeks 3. Patient has been told to not drive for 6 months per [place] law  Discharge Condition:Stable CODE STATUS:Full Diet recommendation: Regular   Brief/Interim Summary: 54 y.o.femalewith a past medical history that includes vitamin D deficiency, hyperlipidemia presented to the emergency department February 21 chief complaint tingling of right leg and speech disturbance. Also complained of an episode where she seemed to stare off and not answer questions or respond for several seconds. Work-up includes CT perfusion showedabnormal restricted diffusion and T2 flair abnormality in left frontal operculum, insular region. Neurology on board. MRI brain showed multifocal abnormally T2/FLAIR hyperintense and expanded gyri with epicenter at the left insula and operculum. Started on keppra for new onset seizures  Discharge Diagnoses:  Principal Problem:   Seizures ([place]) Active Problems:   Numbness and tingling of right leg   Speech disturbance   Transient alteration of awareness   Hypomagnesemia  Right-sided paresthesias, speech disturbance, alteration in awareness New onset seizures Infectious vs autoimmune encephalitis vs primary underlying tumor  - Neurology following - Abnormal CT perfusion study/ MRI brain as noted above - LTM EEG abnormal. Discontinued by neuro - keppra dose increased to 2gm per Neruology - CSF studies: colorless, cell count 1637 RBCs (tube 4), WBC 3 predominant PMN, protein normal, gram stain no organisms, culture NGTD, cryptococcal Ag negative; VZV PCR, HSV PCR, IgG CSF index, oligoclonal bands, fungal culture pending - Patient  concerned about possible lyme's disease given h/o tick bite in 2019 that was treated with doxycycline. Less likely related to lyme per neurology - HIV negative -LP performed 2/25 with work up for autoimmune and paraneoplastic panel pending -Pt to follow up with Neurology in 6-8 weeks  Hypomagnesemia - replace IV as needed  Discharge Instructions  Discharge Instructions    Ambulatory referral to Neurology   Complete by: As directed    An appointment is requested in approximately: 8 weeks     Allergies as of 06/03/2019      Reactions   Lexapro [escitalopram Oxalate] Other (See Comments)   "it was too strong for me"   Wellbutrin [bupropion] Other (See Comments)   "it was too strong for me"   Zoloft [sertraline Hcl] Other (See Comments)   "it was too strong for me"      Medication List    TAKE these medications   acetaminophen 325 MG tablet Commonly known as: TYLENOL Take 325-650 mg by mouth every 6 (six) hours as needed for mild pain or headache.   aspirin 81 MG tablet Take 81 mg by mouth 2 (two) times a week.   ferrous sulfate 325 (65 FE) MG EC tablet Take 325 mg by mouth 3 (three) times a week.   ibuprofen 200 MG tablet Commonly known as: ADVIL Take 200-400 mg by mouth every 6 (six) hours as needed for headache or mild pain.   KRILL OIL PO Take 1 capsule by mouth 3 (three) times a week.   levETIRAcetam 1000 MG tablet Commonly known as: KEPPRA Take 2 tablets (2,000 mg total) by mouth 2 (two) times daily.   Magnesium 500 MG Tabs Take 500 mg by mouth 2 (two) times a week.   Vitamin C 500  MG Caps Take 500-1,000 mg by mouth every other day.   Vitamin D3 125 MCG (5000 UT) Caps Take 10,000 Units by mouth daily.      Follow-up Information    Unk Pinto, MD. Schedule an appointment as soon as possible for a visit in 2 week(s).   Specialty: Internal Medicine Contact information: [address] [place] [address] [phone]         Follow up with Neurology as scheduled Follow up.   Why: 6-8 weeks         Allergies  Allergen Reactions  . Lexapro [Escitalopram Oxalate] Other (See Comments)    "it was too strong for me"  . Wellbutrin [Bupropion] Other (See Comments)    "it was too strong for me"  . Zoloft [Sertraline Hcl] Other (See Comments)    "it was too strong for me"    Consultations:  Neurology  Procedures/Studies: EEG  Result Date: 05/30/2019 Lora Havens, MD     05/31/2019  9:20 AM Patient Name: Kristen Foley MRN: [id number] Epilepsy Attending: Lora Havens Referring Physician/Provider: Dr Kandice Robinsons Date: 05/30/2019 Duration: 24.55 mins Patient history: 54 y.o. female presenting with acute onset of right sided paresthesia and expressive/receptive aphasia. EEG to evaluate for seizure. Level of alertness: awake AEDs during EEG study: LEV Technical aspects: This EEG study was done with scalp electrodes positioned according to the 10-20 International system of electrode placement. Electrical activity was acquired at a sampling rate of 500Hz  and reviewed with a high frequency filter of 70Hz  and a low frequency filter of 1Hz . EEG data were recorded continuously and digitally stored. DESCRIPTION:  The posterior dominant rhythm consists of 9 Hz activity of moderate voltage (25-35 uV) seen predominantly in posterior head regions, symmetric and reactive to eye opening and eye closing. EEG showed continuous 3-5hz  theta-delta slowing in left frontal-anterior temporal region. Sharp transients were also noted in left frontotemporal region.  Hyperventilation and photic stimulation were not performed. ABNORMALITY - Continuous slow, left frontal-anterior temporal region IMPRESSION: This study is suggestive of cortical dysfunction in left frontotemporal region which is non specific and could be secondary to underlying structural lesion, post ictal state. No seizures or definite epileptiform discharges were seen throughout  the recording. Dr Cheral Marker was notified. Lora Havens   CT Code Stroke CTA Head W/WO contrast  Result Date: 05/30/2019 CLINICAL DATA:  54 year old female code stroke presentation with aphasia. Vague hyperdensity left temporal operculum on plain CT at 1926 hours today. EXAM: CT ANGIOGRAPHY HEAD AND NECK CT PERFUSION BRAIN TECHNIQUE: Multidetector CT imaging of the head and neck was performed using the standard protocol during bolus administration of intravenous contrast. Multiplanar CT image reconstructions and MIPs were obtained to evaluate the vascular anatomy. Carotid stenosis measurements (when applicable) are obtained utilizing NASCET criteria, using the distal internal carotid diameter as the denominator. Multiphase CT imaging of the brain was performed following IV bolus contrast injection. Subsequent parametric perfusion maps were calculated using RAPID software. CONTRAST:  178mL OMNIPAQUE IOHEXOL 350 MG/ML SOLN COMPARISON:  plain CT at 1926 hours today. FINDINGS: CT Brain Perfusion Findings: ASPECTS: 10 (vague hyperdense area left operculum) CBF (<30%) Volume: None Perfusion (Tmax>6.0s) volume: None Mismatch Volume: None Infarction Location:None Other findings: Focally increased CBV and CBF in the same area of vague hyperdensity by plain CT. CTA NECK Skeleton: Chronic degenerative and/or posttraumatic changes to the right TMJ. Lower cervical disc, endplate and facet degeneration. No acute osseous abnormality identified. Degeneration Upper chest: Negative. Other  neck: Thyroid size at the upper limits of normal, no definite discrete thyroid nodule. No acute soft tissue findings identified in the neck. Aortic arch: 3 vessel arch configuration with no arch atherosclerosis. Right carotid system: Negative right CCA and right carotid bifurcation. There is a prominent beaded appearance of the distal cervical right ICA (series 9, image 15) in keeping with Fibromuscular Dysplasia (FMD). No stenosis. Left  carotid system: Negative left CCA and left carotid bifurcation. Minimal if any luminal irregularity of the cervical left ICA and no stenosis. Vertebral arteries: Normal proximal right subclavian artery, right vertebral artery origin. The right vertebral appears mildly non dominant and is patent to the skull base without plaque or stenosis. Normal proximal left subclavian artery and left vertebral artery origin. The left vertebral is mildly dominant and patent to the skull base without plaque or stenosis. CTA HEAD Posterior circulation: Dominant left V4 segment. Patent PICA origins. Patent vertebrobasilar junction. Patent basilar artery. Fetal type right PCA origin. SCA and left PCA origins within normal limits. Left posterior communicating artery diminutive or absent. Bilateral PCA branches are within normal limits. Anterior circulation: Both ICA siphons are patent. On the left there is no plaque or stenosis. The right siphon also appears normal. Normal ophthalmic and right posterior communicating artery origins. Patent carotid termini. Normal MCA and ACA origins. Anterior communicating artery and bilateral ACA branches are within normal limits. Right MCA M1 segment and bifurcation are patent without stenosis. Right MCA branches are within normal limits. Left MCA M1 segment and bifurcation are also patent without stenosis. Left MCA branches appear within normal limits. But there are asymmetric vessels which seem to be an increase in cortical venous drainage about the left operculum (series 7, image 19). See also series 8, image 19. There is no AVM nidus identified and no abnormally enlarged arterial supply to the region. Elsewhere intracranial vascularity appears symmetric and within normal limits. Venous sinuses: Patent. Anatomic variants: Fetal type right PCA origin. Review of the MIP images confirms the above findings IMPRESSION: 1. Negative for LVO and CTP detects no ischemia or infarct. 2. Positive for  hyperemia in the left frontal operculum corresponding to the area of vague hyperdensity by plain CT. No discrete AVM and no enhancement typical of meningioma identified. Other top considerations include sequelae of seizure and poorly enhancing tumor (such as a low to intermediate grade glioma). 3. Preliminary report of the above was discussed by telephone with Dr. Kerney Elbe on 05/30/2019 at 1956 hours. MRI is pending. 4. Additionally, there is cervical Right ICA Fibromuscular Dysplasia (FMD). But no atherosclerosis or stenosis in the head or neck. Electronically Signed   By: Genevie Ann M.D.   On: 05/30/2019 20:16   CT Code Stroke CTA Neck W/WO contrast  Result Date: 05/30/2019 CLINICAL DATA:  54 year old female code stroke presentation with aphasia. Vague hyperdensity left temporal operculum on plain CT at 1926 hours today. EXAM: CT ANGIOGRAPHY HEAD AND NECK CT PERFUSION BRAIN TECHNIQUE: Multidetector CT imaging of the head and neck was performed using the standard protocol during bolus administration of intravenous contrast. Multiplanar CT image reconstructions and MIPs were obtained to evaluate the vascular anatomy. Carotid stenosis measurements (when applicable) are obtained utilizing NASCET criteria, using the distal internal carotid diameter as the denominator. Multiphase CT imaging of the brain was performed following IV bolus contrast injection. Subsequent parametric perfusion maps were calculated using RAPID software. CONTRAST:  163mL OMNIPAQUE IOHEXOL 350 MG/ML SOLN COMPARISON:  plain CT at 1926 hours today. FINDINGS: CT  Brain Perfusion Findings: ASPECTS: 10 (vague hyperdense area left operculum) CBF (<30%) Volume: None Perfusion (Tmax>6.0s) volume: None Mismatch Volume: None Infarction Location:None Other findings: Focally increased CBV and CBF in the same area of vague hyperdensity by plain CT. CTA NECK Skeleton: Chronic degenerative and/or posttraumatic changes to the right TMJ. Lower cervical disc,  endplate and facet degeneration. No acute osseous abnormality identified. Degeneration Upper chest: Negative. Other neck: Thyroid size at the upper limits of normal, no definite discrete thyroid nodule. No acute soft tissue findings identified in the neck. Aortic arch: 3 vessel arch configuration with no arch atherosclerosis. Right carotid system: Negative right CCA and right carotid bifurcation. There is a prominent beaded appearance of the distal cervical right ICA (series 9, image 15) in keeping with Fibromuscular Dysplasia (FMD). No stenosis. Left carotid system: Negative left CCA and left carotid bifurcation. Minimal if any luminal irregularity of the cervical left ICA and no stenosis. Vertebral arteries: Normal proximal right subclavian artery, right vertebral artery origin. The right vertebral appears mildly non dominant and is patent to the skull base without plaque or stenosis. Normal proximal left subclavian artery and left vertebral artery origin. The left vertebral is mildly dominant and patent to the skull base without plaque or stenosis. CTA HEAD Posterior circulation: Dominant left V4 segment. Patent PICA origins. Patent vertebrobasilar junction. Patent basilar artery. Fetal type right PCA origin. SCA and left PCA origins within normal limits. Left posterior communicating artery diminutive or absent. Bilateral PCA branches are within normal limits. Anterior circulation: Both ICA siphons are patent. On the left there is no plaque or stenosis. The right siphon also appears normal. Normal ophthalmic and right posterior communicating artery origins. Patent carotid termini. Normal MCA and ACA origins. Anterior communicating artery and bilateral ACA branches are within normal limits. Right MCA M1 segment and bifurcation are patent without stenosis. Right MCA branches are within normal limits. Left MCA M1 segment and bifurcation are also patent without stenosis. Left MCA branches appear within normal limits.  But there are asymmetric vessels which seem to be an increase in cortical venous drainage about the left operculum (series 7, image 19). See also series 8, image 19. There is no AVM nidus identified and no abnormally enlarged arterial supply to the region. Elsewhere intracranial vascularity appears symmetric and within normal limits. Venous sinuses: Patent. Anatomic variants: Fetal type right PCA origin. Review of the MIP images confirms the above findings IMPRESSION: 1. Negative for LVO and CTP detects no ischemia or infarct. 2. Positive for hyperemia in the left frontal operculum corresponding to the area of vague hyperdensity by plain CT. No discrete AVM and no enhancement typical of meningioma identified. Other top considerations include sequelae of seizure and poorly enhancing tumor (such as a low to intermediate grade glioma). 3. Preliminary report of the above was discussed by telephone with Dr. Kerney Elbe on 05/30/2019 at 1956 hours. MRI is pending. 4. Additionally, there is cervical Right ICA Fibromuscular Dysplasia (FMD). But no atherosclerosis or stenosis in the head or neck. Electronically Signed   By: Genevie Ann M.D.   On: 05/30/2019 20:16   CT CHEST W CONTRAST  Result Date: 06/02/2019 CLINICAL DATA:  Staging for cancer of unknown primary. EXAM: CT CHEST, ABDOMEN, AND PELVIS WITH CONTRAST TECHNIQUE: Multidetector CT imaging of the chest, abdomen and pelvis was performed following the standard protocol during bolus administration of intravenous contrast. CONTRAST:  113mL OMNIPAQUE IOHEXOL 300 MG/ML  SOLN COMPARISON:  None. FINDINGS: CT CHEST FINDINGS Cardiovascular: The heart  size is normal. No substantial pericardial effusion. No thoracic aortic aneurysm. Mediastinum/Nodes: No mediastinal lymphadenopathy. No evidence for gross hilar lymphadenopathy although assessment is limited by the lack of intravenous contrast on today's study. The esophagus has normal imaging features. There is no axillary  lymphadenopathy. Lungs/Pleura: Tiny calcified granuloma noted right upper lobe on 36/5. 4 mm right middle lobe nodule identified on 84/5. 6 mm right lower lobe nodule identified on 90/5. 4 mm nodule identified medial right upper lobe on 40/5. No suspicious pulmonary nodule or mass in the left lung. No focal airspace consolidation. No pleural effusion. Musculoskeletal: No worrisome lytic or sclerotic osseous abnormality. CT ABDOMEN PELVIS FINDINGS Hepatobiliary: 4 mm hypodensity in the central right liver (61/3) is too small to characterize but likely benign. Gallbladder is nondistended. No intrahepatic or extrahepatic biliary dilation. Pancreas: No focal mass lesion. No dilatation of the main duct. No intraparenchymal cyst. No peripancreatic edema. Spleen: No splenomegaly. No focal mass lesion. Adrenals/Urinary Tract: No adrenal nodule or mass. Kidneys unremarkable. No evidence for hydroureter. The urinary bladder appears normal for the degree of distention. Stomach/Bowel: Stomach is unremarkable. No gastric wall thickening. No evidence of outlet obstruction. Duodenum is normally positioned as is the ligament of Treitz. No small bowel wall thickening. No small bowel dilatation. The terminal ileum is normal. The appendix is normal. No gross colonic mass. No colonic wall thickening. Vascular/Lymphatic: No abdominal aortic aneurysm. There is no gastrohepatic or hepatoduodenal ligament lymphadenopathy. No retroperitoneal or mesenteric lymphadenopathy. No pelvic sidewall lymphadenopathy. Reproductive: The uterus is unremarkable. 3.1 cm probably benign cyst identified left adnexal space. Other: No substantial intraperitoneal free fluid. Musculoskeletal: No worrisome lytic or sclerotic osseous abnormality. IMPRESSION: 1. No evidence for primary malignancy in the chest, abdomen, or pelvis. 2. Scattered right lung nodules measuring up to 6 mm. Non-contrast chest CT at 3-6 months is recommended. If the nodules are stable at  time of repeat CT, then future CT at 18-24 months (from today's scan) is considered optional for low-risk patients, but is recommended for high-risk patients. This recommendation follows the consensus statement: Guidelines for Management of Incidental Pulmonary Nodules Detected on CT Images: From the Fleischner Society 2017; Radiology 2017; 284:228-243. 3. 3.1 cm probably benign cyst in the left ovary. Follow-up ultrasound in 6-12 weeks recommended. This recommendation follows ACR consensus guidelines: White Paper of the ACR Incidental Findings Committee II on Adnexal Findings. J Am Coll Radiol [phone]. Electronically Signed   By: Misty [name] M.D.   On: 06/02/2019 20:20   MR BRAIN W WO CONTRAST  Result Date: 05/30/2019 CLINICAL DATA:  54 year old female code stroke presentation with aphasia. Abnormal hyperemia of the left operculum on CTA/CTP. Vague increased hyperdensity there on plain CT. EXAM: MRI HEAD WITHOUT AND WITH CONTRAST TECHNIQUE: Multiplanar, multiecho pulse sequences of the brain and surrounding structures were obtained without and with intravenous contrast. CONTRAST:  5.16mL GADAVIST GADOBUTROL 1 MMOL/ML IV SOLN COMPARISON:  CT head, CTA and CTP earlier today. FINDINGS: Brain: Abnormal signal in the left insula and temporal operculum which is primarily limited to the left superior temporal gyrus. There is gyral expansion. See series 11, image 13 and series 17, image 15. On DWI there is gyriform increased trace signal but no convincing restricted diffusion, and some of the abnormal cortex is facilitated. No mineralization or blood products. The left deep gray nuclei appear spared, but similar abnormal gyriform signal and expansion tracks posteriorly through the left temporal lobe toward the lateral occipital lobe as far as coronal series 17, image  10 (FLAIR series 11, image 10), although this seems somewhat discontinuous with the epicenter. Following contrast, there is only trace crescentic  enhancement which corresponds to the subcortical white matter of the affected superior temporal gyrus (series 20, image 21). No other abnormal enhancement. No leptomeningeal or dural thickening identified. There is trace rightward midline shift. No ventriculomegaly. No restricted diffusion to suggest acute infarction. No extra-axial collection or acute intracranial hemorrhage. Cervicomedullary junction and pituitary are within normal limits. Incidental anterior right frontal lobe developmental venous anomaly (normal variant). Negative pituitary and cervicomedullary junction. The contralateral right hemisphere and the brainstem appear normal. Vascular: Major intracranial vascular flow voids are preserved. No abnormal flow voids. The major dural venous sinuses are enhancing and appear to be patent. Skull and upper cervical spine: Heterogeneous bone marrow signal although no destructive osseous lesion identified. Sinuses/Orbits: Negative orbits. Paranasal Visualized paranasal sinuses and mastoids are stable and well pneumatized. Other: Visible internal auditory structures appear normal. Scalp and face soft tissues appear negative. IMPRESSION: 1. Multifocal abnormally T2/FLAIR hyperintense and expanded gyri with epicenter at the left insula and operculum. Somewhat discontinuous involvement which tracks posteriorly toward the lateral left occiput. Only trace associated enhancement in the left superior temporal gyrus. No leptomeningeal or dural involvement. No diffusion restriction or hemorrhage. Mild intracranial mass effect, with trace rightward midline shift. 2. The top differential considerations are encephalitis (including an autoimmune encephalitis), an infiltrative primary tumor (including gliomatosis - although the elevated CBV/CBF may argue against a tumor with so little enhancement), and postictal edema. Study discussed by telephone with Dr. Kerney Elbe on 05/30/2019 at 21:31. It seems that clinical signs and  symptoms currently favor seizure. Currently he is planning EEG, LP, and a repeat MRI in one week. Electronically Signed   By: Genevie Ann M.D.   On: 05/30/2019 21:36   CT ABDOMEN PELVIS W CONTRAST  Result Date: 06/02/2019 CLINICAL DATA:  Staging for cancer of unknown primary. EXAM: CT CHEST, ABDOMEN, AND PELVIS WITH CONTRAST TECHNIQUE: Multidetector CT imaging of the chest, abdomen and pelvis was performed following the standard protocol during bolus administration of intravenous contrast. CONTRAST:  14mL OMNIPAQUE IOHEXOL 300 MG/ML  SOLN COMPARISON:  None. FINDINGS: CT CHEST FINDINGS Cardiovascular: The heart size is normal. No substantial pericardial effusion. No thoracic aortic aneurysm. Mediastinum/Nodes: No mediastinal lymphadenopathy. No evidence for gross hilar lymphadenopathy although assessment is limited by the lack of intravenous contrast on today's study. The esophagus has normal imaging features. There is no axillary lymphadenopathy. Lungs/Pleura: Tiny calcified granuloma noted right upper lobe on 36/5. 4 mm right middle lobe nodule identified on 84/5. 6 mm right lower lobe nodule identified on 90/5. 4 mm nodule identified medial right upper lobe on 40/5. No suspicious pulmonary nodule or mass in the left lung. No focal airspace consolidation. No pleural effusion. Musculoskeletal: No worrisome lytic or sclerotic osseous abnormality. CT ABDOMEN PELVIS FINDINGS Hepatobiliary: 4 mm hypodensity in the central right liver (61/3) is too small to characterize but likely benign. Gallbladder is nondistended. No intrahepatic or extrahepatic biliary dilation. Pancreas: No focal mass lesion. No dilatation of the main duct. No intraparenchymal cyst. No peripancreatic edema. Spleen: No splenomegaly. No focal mass lesion. Adrenals/Urinary Tract: No adrenal nodule or mass. Kidneys unremarkable. No evidence for hydroureter. The urinary bladder appears normal for the degree of distention. Stomach/Bowel: Stomach is  unremarkable. No gastric wall thickening. No evidence of outlet obstruction. Duodenum is normally positioned as is the ligament of Treitz. No small bowel wall thickening. No small bowel dilatation.  The terminal ileum is normal. The appendix is normal. No gross colonic mass. No colonic wall thickening. Vascular/Lymphatic: No abdominal aortic aneurysm. There is no gastrohepatic or hepatoduodenal ligament lymphadenopathy. No retroperitoneal or mesenteric lymphadenopathy. No pelvic sidewall lymphadenopathy. Reproductive: The uterus is unremarkable. 3.1 cm probably benign cyst identified left adnexal space. Other: No substantial intraperitoneal free fluid. Musculoskeletal: No worrisome lytic or sclerotic osseous abnormality. IMPRESSION: 1. No evidence for primary malignancy in the chest, abdomen, or pelvis. 2. Scattered right lung nodules measuring up to 6 mm. Non-contrast chest CT at 3-6 months is recommended. If the nodules are stable at time of repeat CT, then future CT at 18-24 months (from today's scan) is considered optional for low-risk patients, but is recommended for high-risk patients. This recommendation follows the consensus statement: Guidelines for Management of Incidental Pulmonary Nodules Detected on CT Images: From the Fleischner Society 2017; Radiology 2017; 284:228-243. 3. 3.1 cm probably benign cyst in the left ovary. Follow-up ultrasound in 6-12 weeks recommended. This recommendation follows ACR consensus guidelines: White Paper of the ACR Incidental Findings Committee II on Adnexal Findings. J Am Coll Radiol [id number]. Electronically Signed   By: Misty [name] M.D.   On: 06/02/2019 20:20   CT Code Stroke Cerebral Perfusion with contrast  Result Date: 05/30/2019 CLINICAL DATA:  54 year old female code stroke presentation with aphasia. Vague hyperdensity left temporal operculum on plain CT at 1926 hours today. EXAM: CT ANGIOGRAPHY HEAD AND NECK CT PERFUSION BRAIN TECHNIQUE: Multidetector CT  imaging of the head and neck was performed using the standard protocol during bolus administration of intravenous contrast. Multiplanar CT image reconstructions and MIPs were obtained to evaluate the vascular anatomy. Carotid stenosis measurements (when applicable) are obtained utilizing NASCET criteria, using the distal internal carotid diameter as the denominator. Multiphase CT imaging of the brain was performed following IV bolus contrast injection. Subsequent parametric perfusion maps were calculated using RAPID software. CONTRAST:  144mL OMNIPAQUE IOHEXOL 350 MG/ML SOLN COMPARISON:  plain CT at 1926 hours today. FINDINGS: CT Brain Perfusion Findings: ASPECTS: 10 (vague hyperdense area left operculum) CBF (<30%) Volume: None Perfusion (Tmax>6.0s) volume: None Mismatch Volume: None Infarction Location:None Other findings: Focally increased CBV and CBF in the same area of vague hyperdensity by plain CT. CTA NECK Skeleton: Chronic degenerative and/or posttraumatic changes to the right TMJ. Lower cervical disc, endplate and facet degeneration. No acute osseous abnormality identified. Degeneration Upper chest: Negative. Other neck: Thyroid size at the upper limits of normal, no definite discrete thyroid nodule. No acute soft tissue findings identified in the neck. Aortic arch: 3 vessel arch configuration with no arch atherosclerosis. Right carotid system: Negative right CCA and right carotid bifurcation. There is a prominent beaded appearance of the distal cervical right ICA (series 9, image 15) in keeping with Fibromuscular Dysplasia (FMD). No stenosis. Left carotid system: Negative left CCA and left carotid bifurcation. Minimal if any luminal irregularity of the cervical left ICA and no stenosis. Vertebral arteries: Normal proximal right subclavian artery, right vertebral artery origin. The right vertebral appears mildly non dominant and is patent to the skull base without plaque or stenosis. Normal proximal left  subclavian artery and left vertebral artery origin. The left vertebral is mildly dominant and patent to the skull base without plaque or stenosis. CTA HEAD Posterior circulation: Dominant left V4 segment. Patent PICA origins. Patent vertebrobasilar junction. Patent basilar artery. Fetal type right PCA origin. SCA and left PCA origins within normal limits. Left posterior communicating artery diminutive or absent. Bilateral PCA branches  are within normal limits. Anterior circulation: Both ICA siphons are patent. On the left there is no plaque or stenosis. The right siphon also appears normal. Normal ophthalmic and right posterior communicating artery origins. Patent carotid termini. Normal MCA and ACA origins. Anterior communicating artery and bilateral ACA branches are within normal limits. Right MCA M1 segment and bifurcation are patent without stenosis. Right MCA branches are within normal limits. Left MCA M1 segment and bifurcation are also patent without stenosis. Left MCA branches appear within normal limits. But there are asymmetric vessels which seem to be an increase in cortical venous drainage about the left operculum (series 7, image 19). See also series 8, image 19. There is no AVM nidus identified and no abnormally enlarged arterial supply to the region. Elsewhere intracranial vascularity appears symmetric and within normal limits. Venous sinuses: Patent. Anatomic variants: Fetal type right PCA origin. Review of the MIP images confirms the above findings IMPRESSION: 1. Negative for LVO and CTP detects no ischemia or infarct. 2. Positive for hyperemia in the left frontal operculum corresponding to the area of vague hyperdensity by plain CT. No discrete AVM and no enhancement typical of meningioma identified. Other top considerations include sequelae of seizure and poorly enhancing tumor (such as a low to intermediate grade glioma). 3. Preliminary report of the above was discussed by telephone with Dr. Kerney Elbe on 05/30/2019 at 1956 hours. MRI is pending. 4. Additionally, there is cervical Right ICA Fibromuscular Dysplasia (FMD). But no atherosclerosis or stenosis in the head or neck. Electronically Signed   By: Genevie Ann M.D.   On: 05/30/2019 20:16   Overnight EEG with video  Result Date: 06/01/2019 Lora Havens, MD     06/01/2019 12:20 PM Patient Name: Kristen Foley MRN: [id number] Epilepsy Attending: Lora Havens Referring Physician/Provider: Dr  Zeb Comfort Duration: 05/31/2019 1238 to 06/01/2019 1042  Patient history: 54 y.o.femalepresenting with acute onset of right sided paresthesia and expressive/receptive aphasia. EEG to evaluate for seizure.  Level of alertness: awake, asleep  AEDs during EEG study: LEV  Technical aspects: This EEG study was done with scalp electrodes positioned according to the 10-20 International system of electrode placement. Electrical activity was acquired at a sampling rate of 500Hz  and reviewed with a high frequency filter of 70Hz  and a low frequency filter of 1Hz . EEG data were recorded continuously and digitally stored.  DESCRIPTION:  The posterior dominant rhythm consists of 9 Hz activity of moderate voltage (25-35 uV) seen predominantly in posterior head regions, symmetric and reactive to eye opening and eye closing. EEG showed intermittent 2-3Hz delta slowing in left frontal-anterior temporal region which at times appeared rhythmic. Sharp waves were also noted in left frontotemporal region which at times (predominantly in sleep) appeared periodic at 0.5 to 1 Hz.  Hyperventilation and photic stimulation were not performed.  ABNORMALITY -Sharp waves, left frontotemporal region -Intermittent rhythmic delta activity, left frontal-anterior temporal region  IMPRESSION: This study is suggestive of epileptogenicity and cortical dysfunction in left frontotemporal region likely secondary to underlying cortical lesion.  At times, rhythmic delta activity was seen in left  frontal-anterior temporal region which is on the ictal-interictal continuum. Priyanka Barbra Sarks   US PELVIC COMPLETE WITH TRANSVAGINAL  Result Date: 06/03/2019 CLINICAL DATA:  Ovarian cyst EXAM: TRANSABDOMINAL AND TRANSVAGINAL ULTRASOUND OF PELVIS TECHNIQUE: Both transabdominal and transvaginal ultrasound examinations of the pelvis were performed. Transabdominal technique was performed for global imaging of the pelvis including uterus, ovaries, adnexal regions, and pelvic cul-de-sac. It was  necessary to proceed with endovaginal exam following the transabdominal exam to visualize the ovaries. COMPARISON:  CT dated 06/02/2019 FINDINGS: Uterus Measurements: 7.4 x 4.9 x 4.5 cm = volume: 86 mL. No fibroids or other mass visualized. Endometrium Thickness: 6 mm.  No focal abnormality visualized. Right ovary Measurements: 3 x 1.4 x 1.6 cm = volume: 3.6 mL. Normal appearance/no adnexal mass. Left ovary Measurements: 4 x 2.3 x 3.2 cm. There are 2 cystic appearing structures involving the left ovary measuring up to approximately 2.6 and 1.5 cm. These are favored to represent dominant follicles. Other findings There is a trace amount of free fluid in the patient's pelvis. IMPRESSION: 1. No acute abnormality. 2. Simple follicles involving the left ovary as detailed above. No further follow-up is required. 3. Trace amount of pelvic free fluid, likely physiologic. Electronically Signed   By: Constance Holster M.D.   On: 06/03/2019 14:54   CT HEAD CODE STROKE WO CONTRAST  Addendum Date: 05/30/2019   ADDENDUM REPORT: 05/30/2019 20:17 ADDENDUM: Study was discussed by telephone with Dr. Cheral Marker at 1942 hours. Electronically Signed   By: Genevie Ann M.D.   On: 05/30/2019 20:17   Result Date: 05/30/2019 CLINICAL DATA:  Code stroke. 54 year old female with right side tingling/paresthesia and abnormal speech. EXAM: CT HEAD WITHOUT CONTRAST TECHNIQUE: Contiguous axial images were obtained from the base of the skull through the vertex  without intravenous contrast. COMPARISON:  None. FINDINGS: Brain: Normal cerebral volume. No midline shift and basilar cisterns are patent. But there is a vague asymmetric area of mild hyperdensity associated with the left superior temporal gyrus or temporal operculum on series 3, image 11. This measures about 20 mm. No regional cerebral edema. Otherwise gray-white matter differentiation appears symmetric and within normal limits. No ventriculomegaly. No changes of acute cortically based infarct identified. Vascular: No suspicious intracranial vascular hyperdensity. Skull: Negative. Sinuses/Orbits: Visualized paranasal sinuses and mastoids are clear. Other: Visualized orbit soft tissues are within normal limits. Visualized scalp soft tissues are within normal limits. ASPECTS [facility] Stroke Program Early CT Score) Total score (0-10 with 10 being normal): 10 IMPRESSION: 1. Vague asymmetric hyperdensity in a 20 mm area of the left temporal operculum. This is nonspecific but might be a meningioma. Acute intracranial hemorrhage is difficult to exclude. MRI without and with contrast would best characterize further when feasible. 2. No superimposed CT changes of acute cortically based infarct identified. ASPECTS 10. These results were communicated to Dr. Cheral Marker at 7:32 pm on 05/30/2019 by text page via the [facility] messaging system. Electronically Signed: By: Genevie Ann M.D. On: 05/30/2019 19:33     Subjective: Eager to go home. Feeling better today  Discharge Exam: Vitals:   06/03/19 1146 06/03/19 1621  BP: 113/73 121/77  Pulse: 71 (!) 59  Resp:  16  Temp: 98.7 F (37.1 C) 98.4 F (36.9 C)  SpO2: 99% 100%   Vitals:   06/03/19 0814 06/03/19 1100 06/03/19 1146 06/03/19 1621  BP: 114/75  113/73 121/77  Pulse: 62  71 (!) 59  Resp: 16 20  16   Temp: 98.3 F (36.8 C)  98.7 F (37.1 C) 98.4 F (36.9 C)  TempSrc: Oral  Oral Oral  SpO2: 100%  99% 100%  Weight:      Height:        General: Pt is alert,  awake, not in acute distress Cardiovascular: RRR, S1/S2 +, no rubs, no gallops Respiratory: CTA bilaterally, no wheezing, no rhonchi Abdominal: Soft, NT, ND, bowel sounds + Extremities:  no edema, no cyanosis   The results of significant diagnostics from this hospitalization (including imaging, microbiology, ancillary and laboratory) are listed below for reference.     Microbiology: Recent Results (from the past 240 hour(s))  CSF culture     Status: None   Collection Time: 05/31/19 12:00 AM   Specimen: CSF; Cerebrospinal Fluid  Result Value Ref Range Status   Specimen Description CSF  Final   Special Requests NO 2  Final   Gram Stain   Final    WBC PRESENT, PREDOMINANTLY PMN NO ORGANISMS SEEN CYTOSPIN SMEAR    Culture   Final    NO GROWTH 3 DAYS Performed at [facility], [address], [place], [place] [address]    Report Status 06/03/2019 FINAL  Final  Fungus Culture With Stain     Status: None (Preliminary result)   Collection Time: 05/31/19 12:00 AM   Specimen: Nasopharyngeal Swab  Result Value Ref Range Status   Fungus Stain Final report  Final    Comment: (NOTE) Performed At: [facility] [place], [place] [id number] Rush Farmer MD [id number]    Fungus (Mycology) Culture PENDING  Incomplete   Fungal Source CSF  Final  Fungus Culture Result     Status: None   Collection Time: 05/31/19 12:00 AM  Result Value Ref Range Status   Result 1 Comment  Final    Comment: (NOTE) KOH/Calcofluor preparation:  no fungus observed. Performed At: [facility] [place], [place] [id number] Rush Farmer MD [id number]   SARS CORONAVIRUS 2 (TAT 6-24 HRS) Nasopharyngeal Nasopharyngeal Swab     Status: None   Collection Time: 05/31/19 12:16 AM   Specimen: Nasopharyngeal Swab  Result Value Ref Range Status   SARS Coronavirus 2 NEGATIVE NEGATIVE Final    Comment: (NOTE) SARS-CoV-2 target nucleic acids are NOT  DETECTED. The SARS-CoV-2 RNA is generally detectable in upper and lower respiratory specimens during the acute phase of infection. Negative results do not preclude SARS-CoV-2 infection, do not rule out co-infections with other pathogens, and should not be used as the sole basis for treatment or other patient management decisions. Negative results must be combined with clinical observations, patient history, and epidemiological information. The expected result is Negative. Fact Sheet for Patients: SugarRoll.be Fact Sheet for Healthcare Providers: https://www.woods-mathews.com/ This test is not yet approved or cleared by the [place] FDA and  has been authorized for detection and/or diagnosis of SARS-CoV-2 by FDA under an Emergency Use Authorization (EUA). This EUA will remain  in effect (meaning this test can be used) for the duration of the COVID-19 declaration under Section 56 4(b)(1) of the Act, 21 U.S.C. section 360bbb-3(b)(1), unless the authorization is terminated or revoked sooner. Performed at [facility], [place] [address], [place], [place] [address]      Labs: BNP (last 3 results) No results for input(s): BNP in the last 8760 hours. Basic Metabolic Panel: Recent Labs  Lab 05/30/19 1906 05/30/19 1929 05/30/19 2111 05/31/19 0226 06/01/19 0402  NA 138 139  --  142 142  K 3.7 3.6  --  3.7 3.7  CL 102 103  --  109 112*  CO2 23  --   --  23 23  GLUCOSE 120* 113*  --  99 91  BUN 14 15  --  8 9  CREATININE 0.85 0.60  --  0.63 0.67  CALCIUM 9.6  --   --  8.3* 8.2*  MG  --   --  1.7  --  1.7   Liver Function Tests: Recent Labs  Lab 05/30/19 1906 05/30/19 2241 05/31/19 0226 06/01/19 0402  AST 26  --  18 15  ALT 17  --  13 12  ALKPHOS 54  --  42 39  BILITOT 0.6  --  1.0 0.8  PROT 7.4  --  5.6* 5.0*  ALBUMIN 4.4 4.0 3.3* 2.9*   No results for input(s): LIPASE, AMYLASE in the last 168 hours. No results for  input(s): AMMONIA in the last 168 hours. CBC: Recent Labs  Lab 05/30/19 1906 05/30/19 1929 05/31/19 0226  WBC 8.1  --  8.3  NEUTROABS 6.4  --  6.1  HGB 14.2 15.0 12.2  HCT 44.3 44.0 37.6  MCV 97.6  --  98.4  PLT 244  --  209   Cardiac Enzymes: No results for input(s): CKTOTAL, CKMB, CKMBINDEX, TROPONINI in the last 168 hours. BNP: Invalid input(s): POCBNP CBG: Recent Labs  Lab 05/30/19 1928  GLUCAP 110*   D-Dimer No results for input(s): DDIMER in the last 72 hours. Hgb A1c No results for input(s): HGBA1C in the last 72 hours. Lipid Profile No results for input(s): CHOL, HDL, LDLCALC, TRIG, CHOLHDL, LDLDIRECT in the last 72 hours. Thyroid function studies No results for input(s): TSH, T4TOTAL, T3FREE, THYROIDAB in the last 72 hours.  Invalid input(s): FREET3 Anemia work up No results for input(s): VITAMINB12, FOLATE, FERRITIN, TIBC, IRON, RETICCTPCT in the last 72 hours. Urinalysis    Component Value Date/Time   COLORURINE YELLOW 05/30/2019 1918   APPEARANCEUR CLEAR 05/30/2019 1918   LABSPEC <1.005 (L) 05/30/2019 1918   PHURINE 5.0 05/30/2019 1918   GLUCOSEU NEGATIVE 05/30/2019 1918   HGBUR TRACE (A) 05/30/2019 1918   BILIRUBINUR NEGATIVE 05/30/2019 1918   KETONESUR 15 (A) 05/30/2019 1918   PROTEINUR NEGATIVE 05/30/2019 1918   NITRITE NEGATIVE 05/30/2019 1918   LEUKOCYTESUR NEGATIVE 05/30/2019 1918   Sepsis Labs Invalid input(s): PROCALCITONIN,  WBC,  LACTICIDVEN Microbiology Recent Results (from the past 240 hour(s))  CSF culture     Status: None   Collection Time: 05/31/19 12:00 AM   Specimen: CSF; Cerebrospinal Fluid  Result Value Ref Range Status   Specimen Description CSF  Final   Special Requests NO 2  Final   Gram Stain   Final    WBC PRESENT, PREDOMINANTLY PMN NO ORGANISMS SEEN CYTOSPIN SMEAR    Culture   Final    NO GROWTH 3 DAYS Performed at [facility], [address], [place], [place] [address]    Report Status 06/03/2019 FINAL   Final  Fungus Culture With Stain     Status: None (Preliminary result)   Collection Time: 05/31/19 12:00 AM   Specimen: Nasopharyngeal Swab  Result Value Ref Range Status   Fungus Stain Final report  Final    Comment: (NOTE) Performed At: [facility] [place], [place] [id number] Rush Farmer MD [id number]    Fungus (Mycology) Culture PENDING  Incomplete   Fungal Source CSF  Final  Fungus Culture Result     Status: None   Collection Time: 05/31/19 12:00 AM  Result Value Ref Range Status   Result 1 Comment  Final    Comment: (NOTE) KOH/Calcofluor preparation:  no fungus observed. Performed At: [facility] [place], [place] [id number] Rush Farmer MD [id number]   SARS CORONAVIRUS 2 (TAT 6-24 HRS) Nasopharyngeal Nasopharyngeal Swab     Status: None   Collection Time: 05/31/19 12:16  AM   Specimen: Nasopharyngeal Swab  Result Value Ref Range Status   SARS Coronavirus 2 NEGATIVE NEGATIVE Final    Comment: (NOTE) SARS-CoV-2 target nucleic acids are NOT DETECTED. The SARS-CoV-2 RNA is generally detectable in upper and lower respiratory specimens during the acute phase of infection. Negative results do not preclude SARS-CoV-2 infection, do not rule out co-infections with other pathogens, and should not be used as the sole basis for treatment or other patient management decisions. Negative results must be combined with clinical observations, patient history, and epidemiological information. The expected result is Negative. Fact Sheet for Patients: SugarRoll.be Fact Sheet for Healthcare Providers: https://www.woods-mathews.com/ This test is not yet approved or cleared by the [place] FDA and  has been authorized for detection and/or diagnosis of SARS-CoV-2 by FDA under an Emergency Use Authorization (EUA). This EUA will remain  in effect (meaning this test can be used) for the  duration of the COVID-19 declaration under Section 56 4(b)(1) of the Act, 21 U.S.C. section 360bbb-3(b)(1), unless the authorization is terminated or revoked sooner. Performed at [facility], [place] [address], [place], [place] [address]    Time spent: 20min  SIGNED:   Marylu Lund, MD  [facility] 06/03/2019, 6:03 PM  If 7PM-7AM, please contact night-coverage

## 2019-06-03 NOTE — Progress Notes (Signed)
Patient is being discharged home.  IV removed with the catheter intact.  Discharge instructions and prescription information given to the patient and her husband who verbalized understanding.

## 2019-06-03 NOTE — Procedures (Signed)
LUMBAR PUNCTURE (SPINAL TAP) PROCEDURE NOTE  Indication:  Seizure (looking for autoimmune/paraneoplastic etiology)   Proceduralists: Dr. Zeb Comfort, Dr. Amie Portland   Risks of the procedure were dicussed with the patient including post-LP headache, bleeding, infection, weakness/numbness of legs(radiculopathy), death.    Consent obtained from: patient    Procedure Note The patient was prepped and draped, and using sterile technique a 20 gauge quinke spinal needle was inserted in the L4-5 space.   Opening pressure was not checked Approximately 16 cc of CSF were obtained and sent for analysis.  Patient tolerated the procedure well and blood loss was minimal.  Kristen Foley

## 2019-06-03 NOTE — Progress Notes (Addendum)
Subjective: No acute events overnight.  Patient states the sensation in her right foot is almost gone and she can only feel it if she really thinks about it.  Denies any new concerns.  ROS: negative except above  Examination  Vital signs in last 24 hours: Temp:  [97.8 F (36.6 C)-98.7 F (37.1 C)] 98.7 F (37.1 C) (02/25 1146) Pulse Rate:  [56-72] 71 (02/25 1146) Resp:  [16-20] 20 (02/25 1100) BP: (96-124)/(57-87) 113/73 (02/25 1146) SpO2:  [99 %-100 %] 99 % (02/25 1146)  General: lying in bed, not in apparent distress CVS: pulse-normal rate and rhythm RS: breathing comfortably Extremities: normal, warm  Neuro: MS: : Alert, orientedto time,place,person, follows commands, able to name objects but appears to have slight delay and is not "as sharp" as she was prior to admission per husband. CN: pupils equal and reactive, EOMI, face symmetric, tongue midline Motor:Voluntarily moving all 4 extremities in bed   Basic Metabolic Panel: Recent Labs  Lab 05/30/19 1906 05/30/19 1929 05/30/19 2111 05/31/19 0226 06/01/19 0402  NA 138 139  --  142 142  K 3.7 3.6  --  3.7 3.7  CL 102 103  --  109 112*  CO2 23  --   --  23 23  GLUCOSE 120* 113*  --  99 91  BUN 14 15  --  8 9  CREATININE 0.85 0.60  --  0.63 0.67  CALCIUM 9.6  --   --  8.3* 8.2*  MG  --   --  1.7  --  1.7    CBC: Recent Labs  Lab 05/30/19 1906 05/30/19 1929 05/31/19 0226  WBC 8.1  --  8.3  NEUTROABS 6.4  --  6.1  HGB 14.2 15.0 12.2  HCT 44.3 44.0 37.6  MCV 97.6  --  98.4  PLT 244  --  209     Coagulation Studies: No results for input(s): LABPROT, INR in the last 72 hours.  Imaging No new brain imaging   ASSESSMENT AND PLAN:54 y.o.femalepresenting with acute onset of right sided paresthesiasandexpressive/receptiveaphasia.Stat CT head was not consistent with stroke. CT perfusion showed hyperemia in left frontal operculum and MRI brain showed abnormal restricted diffusion and T2 flair  abnormality in left frontal operculum, insular region. LP was done and showed 3 WBCs, normal protein and glucose, negative for HSV and varicella PCR, negative cryptococcal antigen and negative for culture so far.   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  Recommendations -Continue Keppra 2000 mg twice daily -CT chest abdomen pelvis showed right lung nodules and left ovarian cyst.  Discussed with Dr. Wyline Copas to consult pulmonary and GYN oncology for further evaluation as part of paraneoplastic work-up  -Already sent CSF to [facility] for autoimmune and paraneoplastic panel (06/03/2019) -As patient is continuing to improve, will hold off on any immunosuppressive/steroid therapy at this point.  However if there is any recurrence of seizures or worsening of symptoms, will start Solu-Medrol 1000 mg for 3 to 5 days. - Continue seizure precautions -As needed IV Ativan 2 mg for GTClasting more than 2 minutes of focal seizure lasting more than 5 minutes -Follow-up with neurology in 6 to 8 weeks after discharge. -We will need repeat MRI brain with and without contrast on 06/28/2019 (4 weeks since prior MRI brain) to look for resolution  of MRI changes   ADDENDUM - Lab called to notify that they only have 0.5cc of CSF sample which isnt enough for autoimmune and paraneoplastic panel. Will discuss with patient and repeat LP  I have spent a total of30minuteswith the patient reviewing hospitalnotes, test results, labs and examining the patient as well as establishing an assessment and plan that was discussed personally with the patientand herhusband.>50% of time was spent in direct patient care.  Peace Jost Barbra Sarks

## 2019-06-03 NOTE — Plan of Care (Signed)
  Problem: Education: Goal: Knowledge of General Education information will improve Description: Including pain rating scale, medication(s)/side effects and non-pharmacologic comfort measures Outcome: Progressing   Problem: Health Behavior/Discharge Planning: Goal: Ability to manage health-related needs will improve Outcome: Progressing   Problem: Clinical Measurements: Goal: Ability to maintain clinical measurements within normal limits will improve Outcome: Progressing Goal: Will remain free from infection Outcome: Progressing Goal: Diagnostic test results will improve Outcome: Progressing Goal: Respiratory complications will improve Outcome: Progressing Goal: Cardiovascular complication will be avoided Outcome: Progressing   Problem: Activity: Goal: Risk for activity intolerance will decrease Outcome: Progressing   Problem: Nutrition: Goal: Adequate nutrition will be maintained Outcome: Progressing   Problem: Coping: Goal: Level of anxiety will decrease Outcome: Progressing   Problem: Elimination: Goal: Will not experience complications related to bowel motility Outcome: Progressing Goal: Will not experience complications related to urinary retention Outcome: Progressing   Problem: Pain Managment: Goal: General experience of comfort will improve Outcome: Progressing   Problem: Safety: Goal: Ability to remain free from injury will improve Outcome: Progressing   Problem: Skin Integrity: Goal: Risk for impaired skin integrity will decrease Outcome: Progressing   Problem: Education: Goal: Expressions of having a comfortable level of knowledge regarding the disease process will increase Outcome: Progressing   Problem: Coping: Goal: Ability to adjust to condition or change in health will improve Outcome: Progressing Goal: Ability to identify appropriate support needs will improve Outcome: Progressing   Problem: Health Behavior/Discharge Planning: Goal:  Compliance with prescribed medication regimen will improve Outcome: Progressing   Problem: Medication: Goal: Risk for medication side effects will decrease Outcome: Progressing   Problem: Clinical Measurements: Goal: Complications related to the disease process, condition or treatment will be avoided or minimized Outcome: Progressing Goal: Diagnostic test results will improve Outcome: Progressing   Problem: Safety: Goal: Verbalization of understanding the information provided will improve Outcome: Progressing   Problem: Self-Concept: Goal: Level of anxiety will decrease Outcome: Progressing Goal: Ability to verbalize feelings about condition will improve Outcome: Progressing   Problem: Education: Goal: Knowledge of disease or condition will improve Outcome: Progressing Goal: Knowledge of secondary prevention will improve Outcome: Progressing Goal: Knowledge of patient specific risk factors addressed and post discharge goals established will improve Outcome: Progressing Goal: Individualized Educational Video(s) Outcome: Progressing   Problem: Coping: Goal: Will verbalize positive feelings about self Outcome: Progressing Goal: Will identify appropriate support needs Outcome: Progressing   Problem: Health Behavior/Discharge Planning: Goal: Ability to manage health-related needs will improve Outcome: Progressing   Problem: Self-Care: Goal: Ability to participate in self-care as condition permits will improve Outcome: Progressing Goal: Verbalization of feelings and concerns over difficulty with self-care will improve Outcome: Progressing Goal: Ability to communicate needs accurately will improve Outcome: Progressing   Problem: Nutrition: Goal: Risk of aspiration will decrease Outcome: Progressing   Problem: Ischemic Stroke/TIA Tissue Perfusion: Goal: Complications of ischemic stroke/TIA will be minimized Outcome: Progressing

## 2019-06-03 NOTE — Plan of Care (Signed)
  Problem: Education: Goal: Knowledge of General Education information will improve Description: Including pain rating scale, medication(s)/side effects and non-pharmacologic comfort measures 06/03/2019 1721 by Caroll Rancher, RN Outcome: Adequate for Discharge 06/03/2019 1009 by Caroll Rancher, RN Outcome: Progressing   Problem: Health Behavior/Discharge Planning: Goal: Ability to manage health-related needs will improve 06/03/2019 1721 by Caroll Rancher, RN Outcome: Adequate for Discharge 06/03/2019 1009 by Caroll Rancher, RN Outcome: Progressing   Problem: Nutrition: Goal: Adequate nutrition will be maintained 06/03/2019 1721 by Caroll Rancher, RN Outcome: Adequate for Discharge 06/03/2019 1009 by Caroll Rancher, RN Outcome: Progressing   Problem: Coping: Goal: Level of anxiety will decrease 06/03/2019 1721 by Caroll Rancher, RN Outcome: Adequate for Discharge 06/03/2019 1009 by Caroll Rancher, RN Outcome: Progressing

## 2019-06-03 NOTE — Plan of Care (Signed)
  Problem: Education: Goal: Knowledge of General Education information will improve Description: Including pain rating scale, medication(s)/side effects and non-pharmacologic comfort measures Outcome: Progressing   Problem: Nutrition: Goal: Adequate nutrition will be maintained Outcome: Progressing   

## 2019-06-03 NOTE — Consult Note (Signed)
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 PCCM consulted for incidental pulmonary nodules.  History of present illness   Ms. Kristen Foley is a 54 year old female never smoker with no pertinent medical history who presented with paraesthesia, abnormal speech pattern and altered menatl status and admitted for concern for seizures. Neuro consulted and started on Keppra. CT perfusion and MRI obtained demonstrated abnormality in left frontal operculum. Patient has underwent LP on 2/22 that was unrevealing so far. CT CAP obtained for work-up and demonstrated subcentimeter nodules in right lung. Repeat LP completed today and CSF was sent to [facility] clinic to rule out autoimmune and paraneoplastic process. PCCM consulted regarding abnormal CT.  Patient denies any respiratory symptoms. No current or history of chronic cough, shortness of breath, wheezing, flushing, diarrhea, constipation, urinary frequency. [place] Denies any known toxic occupational or environmental exposures. No personal history of cancer.  Past Medical History  None pertinent  Significant Hospital Events     Consults:  PCCM Neuro  Procedures:  LP 2/22  Significant Diagnostic Tests:  MR Brain 2/21 1. Multifocal abnormally T2/FLAIR hyperintense and expanded gyri with epicenter at the left insula and operculum. Somewhat discontinuous involvement which tracks posteriorly toward the lateral left occiput. Only trace associated enhancement in the left superior temporal gyrus. No leptomeningeal or dural involvement. No diffusion restriction or hemorrhage. Mild intracranial mass effect, with trace rightward midline shift.  2. The top differential considerations are encephalitis  (including an autoimmune encephalitis), an infiltrative primary tumor (including gliomatosis - although the elevated CBV/CBF may argue against a tumor with so little enhancement), and postictal edema.  CT Chest/Abdomen/Pelvis 06/02/19 1. No evidence for primary malignancy in the chest, abdomen, or pelvis. 2. Scattered right lung nodules measuring up to 6 mm. Non-contrast chest CT at 3-6 months is recommended. If the nodules are stable at time of repeat CT, then future CT at 18-24 months (from today's scan) is considered optional for low-risk patients, but is recommended for high-risk patients. This recommendation follows the consensus statement: Guidelines for Management of Incidental Pulmonary Nodules Detected on CT Images: From the Fleischner Society 2017; Radiology 2017; 284:228-243. 3. 3.1 cm probably benign cyst in the left ovary. Follow-up ultrasound in 6-12 weeks recommended. This recommendation follows ACR consensus guidelines: White Paper of the ACR Incidental Findings Committee II on Adnexal Findings. J Am Coll Radiol [phone].  Micro Data:  LP 2/22 culture - neg LP 2/22 fungal culture - neg  Antimicrobials:    Interim history/subjective:  As above  Objective   Blood pressure 113/73, pulse 71, temperature 98.7 F (37.1 C), temperature source Oral, resp. rate 20, height 5\' 11"  (1.803 m), weight 49.5 kg, SpO2 99 %.        Intake/Output Summary (Last 24 hours) at 06/03/2019 1640 Last data filed at 06/03/2019 0814 Gross per 24 hour  Intake 360 ml  Output --  Net 360 ml   Filed Weights   05/30/19 1909 05/30/19 1915 05/31/19 0108  Weight: 48 kg 52.4 kg 49.5 kg   Physical Exam: General: Well-appearing, no acute distress HENT: [place], AT, OP clear, MMM Eyes: EOMI, no scleral icterus Respiratory: Clear to auscultation bilaterally.  No crackles, wheezing or rales Cardiovascular: RRR, -M/R/G, no JVD  GI: BS+, soft, nontender Extremities:-Edema,-tenderness Neuro:  AAO x4, CNII-XII grossly intact Skin: Intact, no rashes or bruising Psych: Normal mood, normal affect  Assessment & Plan:  54 year old female never smoker with no prior medical history who presented with paraesthesia, abnormal speech pattern and altered menatl status and admitted for new onset seizures. Neuro consulted and patient diagnosed with focal nonconvulsive status epilepticus. Etiology of seizures unclear with work-up concerning for autoimmune and/or paraneoplastic disease. Pan CT obtained and demonstrated lung nodules and left ovarian cyst.  I reviewed CT Chest which demonstrated subcentimeter nodules x 3 in the right lung with largest measuring ~83mm in the RLL. No evidence of hilar or mediastinal adenopathy is presents. Airways are also patent with no obvious suggestion of endobronchial lesion. No prior chest imaging for comparison however nodules are small with smooth borders and are benign-appearing. In the absence of respiratory symptoms and concerning findings on CT, I suspect diagnostic bronchoscopy would be low yield in identifying occult malignancy related to paraneoplastic syndrome. I discussed with primary team and Neurology regarding PET/CT to identify potential active lesions. At the very least, would recommend repeating CT chest in 6 months to follow-up with lung nodules to ensure stability. This can be ordered and followed by her PCP.   Recommendations CT Chest without contrast in 6 months Consider PET/CT to identify occult malignancy  Thank you for involving [place] in the care of your patient. Please call as needed for questions or concerns.  Rodman Pickle, M.D. [facility] Pulmonary/Critical Care Medicine 06/03/2019 9:31 PM    Labs   CBC: Recent Labs  Lab 05/30/19 1906 05/30/19 1929 05/31/19 0226  WBC 8.1  --  8.3  NEUTROABS 6.4  --  6.1  HGB 14.2 15.0 12.2  HCT 44.3 44.0 37.6  MCV 97.6  --  98.4  PLT 244  --  [id number]    Basic Metabolic Panel: Recent Labs  Lab  05/30/19 1906 05/30/19 1929 05/30/19 2111 05/31/19 0226 06/01/19 0402  NA 138 139  --  142 142  K 3.7 3.6  --  3.7 3.7  CL 102 103  --  109 112*  CO2 23  --   --  23 23  GLUCOSE 120* 113*  --  99 91  BUN 14 15  --  8 9  CREATININE 0.85 0.60  --  0.63 0.67  CALCIUM 9.6  --   --  8.3* 8.2*  MG  --   --  1.7  --  1.7   GFR: Estimated Creatinine Clearance: 65 mL/min (by C-G formula based on SCr of 0.67 mg/dL). Recent Labs  Lab 05/30/19 1906 05/31/19 0226  WBC 8.1 8.3    Liver Function Tests: Recent Labs  Lab 05/30/19 1906 05/30/19 2241 05/31/19 0226 06/01/19 0402  AST 26  --  18 15  ALT 17  --  13 12  ALKPHOS 54  --  42 39  BILITOT 0.6  --  1.0 0.8  PROT 7.4  --  5.6* 5.0*  ALBUMIN 4.4 4.0 3.3* 2.9*   No results for input(s): LIPASE, AMYLASE in the last 168 hours. No results for input(s): AMMONIA in the last 168 hours.  ABG    Component Value Date/Time   TCO2 25 05/30/2019 1929     Coagulation Profile: Recent Labs  Lab 05/30/19 1906  INR 1.1    Cardiac Enzymes: No results for input(s): CKTOTAL, CKMB, CKMBINDEX, TROPONINI in the last 168 hours.  HbA1C: Hgb A1c MFr Bld  Date/Time  Value Ref Range Status  03/18/2018 04:24 PM 5.0 <5.7 % of total Hgb Final    Comment:    For the purpose of screening for the presence of diabetes: . <5.7%       Consistent with the absence of diabetes 5.7-6.4%    Consistent with increased risk for diabetes             (prediabetes) > or =6.5%  Consistent with diabetes . This assay result is consistent with a decreased risk of diabetes. . Currently, no consensus exists regarding use of hemoglobin A1c for diagnosis of diabetes in children. . According to American Diabetes Association (ADA) guidelines, hemoglobin A1c <7.0% represents optimal control in non-pregnant diabetic patients. Different metrics may apply to specific patient populations.  Standards of Medical Care in Diabetes(ADA). .     CBG: Recent Labs   Lab 05/30/19 1928  GLUCAP 110*    Review of Systems:   Review of Systems  Constitutional: Negative for chills, diaphoresis, fever, malaise/fatigue and weight loss.  HENT: Negative for congestion, ear pain and sore throat.   Respiratory: Negative for cough, hemoptysis, sputum production, shortness of breath and wheezing.   Cardiovascular: Negative for chest pain, palpitations and leg swelling.  Gastrointestinal: Negative for abdominal pain, heartburn and nausea.  Genitourinary: Negative for frequency.  Musculoskeletal: Negative for joint pain and myalgias.  Skin: Negative for itching and rash.  Neurological: Positive for tingling, speech change and seizures. Negative for dizziness, weakness and headaches.  Endo/Heme/Allergies: Does not bruise/bleed easily.  Psychiatric/Behavioral: Negative for depression. The patient is not nervous/anxious.      Past Medical History  She,  has a past medical history of Allergy and Anemia.   Surgical History    Past Surgical History:  Procedure Laterality Date  . [place]     Social History   reports that she has never smoked. She has never used smokeless tobacco.   Family History   Her family history is not on file.   Allergies Allergies  Allergen Reactions  . Lexapro [Escitalopram Oxalate] Other (See Comments)    "it was too strong for me"  . Wellbutrin [Bupropion] Other (See Comments)    "it was too strong for me"  . Zoloft [Sertraline Hcl] Other (See Comments)    "it was too strong for me"     Home Medications  Prior to Admission medications   Medication Sig Start Date End Date Taking? Authorizing Provider  acetaminophen (TYLENOL) 325 MG tablet Take 325-650 mg by mouth every 6 (six) hours as needed for mild pain or headache.   Yes [provider]  Ascorbic Acid (VITAMIN C) 500 MG CAPS Take 500-1,000 mg by mouth every other day.   Yes [provider]  aspirin 81 MG tablet Take 81 mg by  mouth 2 (two) times a week.    Yes [provider]  Cholecalciferol (VITAMIN D3) 125 MCG (5000 UT) CAPS Take 10,000 Units by mouth daily.   Yes [provider]  ferrous sulfate 325 (65 FE) MG EC tablet Take 325 mg by mouth 3 (three) times a week.   Yes [provider]  ibuprofen (ADVIL) 200 MG tablet Take 200-400 mg by mouth every 6 (six) hours as needed for headache or mild pain.   Yes [provider]  KRILL OIL PO Take 1 capsule by mouth 3 (three) times a week.    Yes [provider]  Magnesium 500 MG TABS Take 500 mg by mouth  2 (two) times a week.    Yes [provider]     Care time: 56 minutes    Rodman Pickle, M.D. [facility] Pulmonary/Critical Care Medicine 06/03/2019 4:41 PM

## 2019-06-04 ENCOUNTER — Other Ambulatory Visit: Payer: Self-pay | Admitting: Neurology

## 2019-06-04 DIAGNOSIS — R569 Unspecified convulsions: Secondary | ICD-10-CM

## 2019-06-09 ENCOUNTER — Ambulatory Visit (INDEPENDENT_AMBULATORY_CARE_PROVIDER_SITE_OTHER): Payer: 59 | Admitting: Neurology

## 2019-06-09 ENCOUNTER — Encounter: Payer: Self-pay | Admitting: Neurology

## 2019-06-09 ENCOUNTER — Other Ambulatory Visit: Payer: Self-pay

## 2019-06-09 VITALS — BP 149/79 | HR 74 | Ht 62.0 in | Wt 105.0 lb

## 2019-06-09 DIAGNOSIS — R9089 Other abnormal findings on diagnostic imaging of central nervous system: Secondary | ICD-10-CM

## 2019-06-09 DIAGNOSIS — R569 Unspecified convulsions: Secondary | ICD-10-CM

## 2019-06-09 MED ORDER — LEVETIRACETAM 1000 MG PO TABS: 2000.0000 mg | ORAL_TABLET | Freq: Two times a day (BID) | ORAL | 11 refills | Status: DC
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
## 2019-06-10 ENCOUNTER — Other Ambulatory Visit: Payer: Self-pay

## 2019-06-10 ENCOUNTER — Ambulatory Visit (INDEPENDENT_AMBULATORY_CARE_PROVIDER_SITE_OTHER): Payer: 59 | Admitting: Internal Medicine

## 2019-06-10 VITALS — BP 124/80 | HR 72 | Temp 97.2°F | Resp 16 | Ht 62.0 in | Wt 102.4 lb

## 2019-06-10 DIAGNOSIS — G40219 Localization-related (focal) (partial) symptomatic epilepsy and epileptic syndromes with complex partial seizures, intractable, without status epilepticus: Secondary | ICD-10-CM

## 2019-06-10 DIAGNOSIS — R0989 Other specified symptoms and signs involving the circulatory and respiratory systems: Secondary | ICD-10-CM

## 2019-06-10 DIAGNOSIS — Z79899 Other long term (current) drug therapy: Secondary | ICD-10-CM | POA: Diagnosis not present

## 2019-06-10 NOTE — Progress Notes (Signed)
[place]     This very nice 54 y.o. MWF was admitted to the hospital on  02.21.2021  and patient was discharged from the hospital on 02.25.2021. The patient now presents for follow up for transition from recent hospitalization .  The discharge summary, medications and diagnostic test results were reviewed before meeting with the patient. The patient was admitted for:     Right-sided paresthesias   Speech disturbance   Transient alteration of awareness   Hypomagnesemia     Patient was hospitalized for acute onset of Right sided Paresthesias, clumsiness of the Right hand and receptive & expressive aphasia. She had CT brain & MRI Brain scans and EEG and was started on Keppra and dose was increased up to 2 grams bid concomitant with sx's resolving. LP's were performed to r/o infectious & autoimmune ( results pending) etiologies.       Hospitalization discharge instructions and medications are reconciled with the patient.      Patient is also followed with hx/ elevated BP  and Vitamin D Deficiency.  Patient was seen for out-patient consultation on 03.02.2021 by Dr Delice Lesch with impression of new onset seizures.      Patient is monitored as above for labile HTN & BP has been controlled at home. Today's BP is at goal - 124/80. Patient has had no complaints of any cardiac type chest pain, palpitations, dyspnea/orthopnea/PND, dizziness, claudication, or dependent edema.     Hyperlipidemia is controlled with diet & meds. Patient denies myalgias or other med SE's. Last Lipids were at goal:  Lab Results  Component Value Date   CHOL 206 (H) 03/18/2018   HDL 95 03/18/2018   LDLCALC 94 03/18/2018   TRIG 81 03/18/2018   CHOLHDL 2.2 03/18/2018      Also, the patient is monitored expectantly for glucose intolerance  and patient has had no symptoms of reactive hypoglycemia, diabetic polys, paresthesias or visual blurring.  Last A1c was Normal & at goal:  Lab Results  Component Value Date    HGBA1C 5.0 03/18/2018      Further, the patient also has history of Vitamin D Deficiency and supplements vitamin D without any suspected side-effects. Last vitamin D was   Lab Results  Component Value Date   VD25OH 46 03/18/2018   Current Outpatient Medications on File Prior to Visit  Medication Sig  . acetaminophen (TYLENOL) 325 MG tablet Take 325-650 mg by mouth every 6 (six) hours as needed for mild pain or headache.  . Ascorbic Acid (VITAMIN C) 500 MG CAPS Take 500-1,000 mg by mouth every other day.  Marland Kitchen aspirin 81 MG tablet Take 81 mg by mouth 2 (two) times a week.   . Cholecalciferol (VITAMIN D3) 125 MCG (5000 UT) CAPS Take 10,000 Units by mouth daily.  . ferrous sulfate 325 (65 FE) MG EC tablet Take 325 mg by mouth 3 (three) times a week.  Marland Kitchen ibuprofen (ADVIL) 200 MG tablet Take 200-400 mg by mouth every 6 (six) hours as needed for headache or mild pain.  Marland Kitchen KRILL OIL PO Take 1 capsule by mouth 3 (three) times a week.   . levETIRAcetam (KEPPRA) 1000 MG tablet Take 2 tablets (2,000 mg total) by mouth 2 (two) times daily.  . Magnesium 500 MG TABS Take 500 mg by mouth 2 (two) times a week.   . zinc gluconate 50 MG tablet Take 50 mg by mouth daily.   No current facility-administered medications on file prior to visit.  Allergies  Allergen Reactions  . Lexapro [Escitalopram Oxalate] Other (See Comments)    "it was too strong for me"  . Wellbutrin [Bupropion] Other (See Comments)    "it was too strong for me"  . Zoloft [Sertraline Hcl] Other (See Comments)    "it was too strong for me"   PMHx:   Past Medical History:  Diagnosis Date  . Allergy   . Anemia    Immunization History  Administered Date(s) Administered  . PPD Test 03/18/2018, 04/15/2019   Past Surgical History:  Procedure Laterality Date  . MANDIBLE RECONSTRUCTION  1986   FHx:    Reviewed / unchanged  SHx:    Reviewed / unchanged  Systems Review:  Constitutional: Denies fever, chills, wt changes,  headaches, insomnia, fatigue, night sweats, change in appetite. Eyes: Denies redness, blurred vision, diplopia, discharge, itchy, watery eyes.  ENT: Denies discharge, congestion, post nasal drip, epistaxis, sore throat, earache, hearing loss, dental pain, tinnitus, vertigo, sinus pain, snoring.  CV: Denies chest pain, palpitations, irregular heartbeat, syncope, dyspnea, diaphoresis, orthopnea, PND, claudication or edema. Respiratory: denies cough, dyspnea, DOE, pleurisy, hoarseness, laryngitis, wheezing.  Gastrointestinal: Denies dysphagia, odynophagia, heartburn, reflux, water brash, abdominal pain or cramps, nausea, vomiting, bloating, diarrhea, constipation, hematemesis, melena, hematochezia  or hemorrhoids. Genitourinary: Denies dysuria, frequency, urgency, nocturia, hesitancy, discharge, hematuria or flank pain. Musculoskeletal: Denies arthralgias, myalgias, stiffness, jt. swelling, pain, limping or strain/sprain.  Skin: Denies pruritus, rash, hives, warts, acne, eczema or change in skin lesion(s). Neuro: No weakness, tremor, incoordination, spasms, paresthesia or pain. Psychiatric: Denies confusion, memory loss or sensory loss. Endo: Denies change in weight, skin or hair change.  Heme/Lymph: No excessive bleeding, bruising or enlarged lymph nodes.  Physical Exam  BP 124/80   Pulse 72   Temp (!) 97.2 F (36.2 C)   Resp 16   Ht 5\' 2"  (1.575 m)   Wt 102 lb 6.4 oz (46.4 kg)   BMI 18.73 kg/m   Appears well nourished, well groomed  and in no distress.  Eyes: PERRLA, EOMs, conjunctiva no swelling or erythema. Sinuses: No frontal/maxillary tenderness ENT/Mouth: EAC's clear, TM's nl w/o erythema, bulging. Nares clear w/o erythema, swelling, exudates. Oropharynx clear without erythema or exudates. Oral hygiene is good. Tongue normal, non obstructing. Hearing intact.  Neck: Supple. Thyroid nl. Car 2+/2+ without bruits, nodes or JVD. Chest: Respirations nl with BS clear & equal w/o rales,  rhonchi, wheezing or stridor.  Cor: Heart sounds normal w/ regular rate and rhythm without sig. murmurs, gallops, clicks or rubs. Peripheral pulses normal and equal  without edema.  Abdomen: Soft & bowel sounds normal. Non-tender w/o guarding, rebound, hernias, masses or organomegaly.  Lymphatics: Unremarkable.  Musculoskeletal: Full ROM all peripheral extremities, joint stability, 5/5 strength and normal gait.  Skin: Warm, dry without exposed rashes, lesions or ecchymosis apparent.  Neuro: Cranial nerves intact, reflexes equal bilaterally. Sensory-motor testing grossly intact. Tendon reflexes grossly intact.  Pysch: Alert & oriented x 3.  Insight and judgement nl & appropriate. No ideations.  Assessment and Plan:  1. Partial symptomatic epilepsy with complex partial seizures, intractable, without status epilepticus (HCC)  - Levetiracetam level  2. Labile hypertension  - Continue monitor blood pressure at home.  - Continue DASH diet. Reminder to go to the ER if any CP,  SOB, nausea, dizziness, severe HA, changes vision/speech.  - COMPLETE METABOLIC PANEL WITH GFR  3. Hypomagnesemia  - Continue supplementation.  - Magnesium  4. Medication management  - COMPLETE METABOLIC PANEL WITH GFR -  Magnesium - Levetiracetam level        Discussed  regular exercise, BP monitoring, weight control to achieve/maintain BMI less than 25 and discussed meds and SE's. Recommended labs to assess and monitor clinical status with further disposition pending results of labs. Over 30 minutes of exam, counseling, chart review was performed.

## 2019-06-10 NOTE — Progress Notes (Signed)
[id number] valid until 06/01

## 2019-06-12 ENCOUNTER — Encounter: Payer: Self-pay | Admitting: Internal Medicine

## 2019-06-12 NOTE — Patient Instructions (Signed)
Seizure, Adult °A seizure is a sudden burst of abnormal electrical activity in the brain. Seizures usually last from 30 seconds to 2 minutes. They can cause many different symptoms. °Usually, seizures are not harmful unless they last a long time. °What are the causes? °Common causes of this condition include: °· Fever or infection. °· Conditions that affect the brain, such as: °? A brain abnormality that you were born with. °? A brain or head injury. °? Bleeding in the brain. °? A tumor. °? Stroke. °? Brain disorders such as autism or cerebral palsy. °· Low blood sugar. °· Conditions that are passed from parent to child (are inherited). °· Problems with substances, such as: °? Having a reaction to a drug or a medicine. °? Suddenly stopping the use of a substance (withdrawal). °In some cases, the cause may not be known. A person who has repeated seizures over time without a clear cause has a condition called epilepsy. °What increases the risk? °You are more likely to get this condition if you have: °· A family history of epilepsy. °· Had a seizure in the past. °· A brain disorder. °· A history of head injury, lack of oxygen at birth, or strokes. °What are the signs or symptoms? °There are many types of seizures. The symptoms vary depending on the type of seizure you have. Examples of symptoms during a seizure include: °· Shaking (convulsions). °· Stiffness in the body. °· Passing out (losing consciousness). °· Head nodding. °· Staring. °· Not responding to sound or touch. °· Loss of bladder control and bowel control. °Some people have symptoms right before and right after a seizure happens. °Symptoms before a seizure may include: °· Fear. °· Worry (anxiety). °· Feeling like you may vomit (nauseous). °· Feeling like the room is spinning (vertigo). °· Feeling like you saw or heard something before (déjà vu). °· Odd tastes or smells. °· Changes in how you see. You may see flashing lights or spots. °Symptoms after a  seizure happens can include: °· Confusion. °· Sleepiness. °· Headache. °· Weakness on one side of the body. °How is this treated? °Most seizures will stop on their own in under 5 minutes. In these cases, no treatment is needed. Seizures that last longer than 5 minutes will usually need treatment. Treatment can include: °· Medicines given through an IV tube. °· Avoiding things that are known to cause your seizures. These can include medicines that you take for another condition. °· Medicines to treat epilepsy. °· Surgery to stop the seizures. This may be needed if medicines do not help. °Follow these instructions at home: °Medicines °· Take over-the-counter and prescription medicines only as told by your doctor. °· Do not eat or drink anything that may keep your medicine from working, such as alcohol. °Activity °· Do not do any activities that would be dangerous if you had another seizure, like driving or swimming. Wait until your doctor says it is safe for you to do them. °· If you live in the [place] ask your local DMV (department of motor vehicles) when you can drive. °· Get plenty of rest. °Teaching others °Teach friends and family what to do when you have a seizure. They should: °· Lay you on the ground. °· Protect your head and body. °· Loosen any tight clothing around your neck. °· Turn you on your side. °· Not hold you down. °· Not put anything into your mouth. °· Know whether or not you need emergency care. °· Stay   with you until you are better. ° °General instructions °· Contact your doctor each time you have a seizure. °· Avoid anything that gives you seizures. °· Keep a seizure diary. Write down: °? What you think caused each seizure. °? What you remember about each seizure. °· Keep all follow-up visits as told by your doctor. This is important. °Contact a doctor if: °· You have another seizure. °· You have seizures more often. °· There is any change in what happens during your seizures. °· You keep having  seizures with treatment. °· You have symptoms of being sick or having an infection. °Get help right away if: °· You have a seizure that: °? Lasts longer than 5 minutes. °? Is different than seizures you had before. °? Makes it harder to breathe. °? Happens after you hurt your head. °· You have any of these symptoms after a seizure: °? Not being able to speak. °? Not being able to use a part of your body. °? Confusion. °? A bad headache. °· You have two or more seizures in a row. °· You do not wake up right after a seizure. °· You get hurt during a seizure. °These symptoms may be an emergency. Do not wait to see if the symptoms will go away. Get medical help right away. Call your local emergency services (911 in the [place] Do not drive yourself to the hospital. °Summary °· Seizures usually last from 30 seconds to 2 minutes. Usually, they are not harmful unless they last a long time. °· Do not eat or drink anything that may keep your medicine from working, such as alcohol. °· Teach friends and family what to do when you have a seizure. °· Contact your doctor each time you have a seizure. °This information is not intended to replace advice given to you by your health care provider. Make sure you discuss any questions you have with your health care provider. °Document Revised: 06/12/2018 Document Reviewed: 06/12/2018 °Elsevier Patient Education © 2020 [employer] ° °

## 2019-06-14 LAB — COMPLETE METABOLIC PANEL WITH GFR
AG Ratio: 1.8 (calc) (ref 1.0–2.5)
ALT: 10 U/L (ref 6–29)
AST: 14 U/L (ref 10–35)
Albumin: 4.4 g/dL (ref 3.6–5.1)
Alkaline phosphatase (APISO): 57 U/L (ref 37–153)
BUN: 14 mg/dL (ref 7–25)
CO2: 28 mmol/L (ref 20–32)
Calcium: 9.7 mg/dL (ref 8.6–10.4)
Chloride: 103 mmol/L (ref 98–110)
Creat: 0.81 mg/dL (ref 0.50–1.05)
GFR, Est African American: 97 mL/min/{1.73_m2} (ref >=60)
GFR, Est Non African American: 84 mL/min/{1.73_m2} (ref >=60)
Globulin: 2.5 g/dL (calc) (ref 1.9–3.7)
Glucose, Bld: 123 mg/dL — ABNORMAL HIGH (ref 65–99)
Potassium: 4.2 mmol/L (ref 3.5–5.3)
Sodium: 139 mmol/L (ref 135–146)
Total Bilirubin: 0.4 mg/dL (ref 0.2–1.2)
Total Protein: 6.9 g/dL (ref 6.1–8.1)

## 2019-06-14 LAB — LEVETIRACETAM LEVEL: Keppra (Levetiracetam): 72.6 ug/mL — ABNORMAL HIGH (ref 12.0–46.0)

## 2019-06-14 LAB — MAGNESIUM: Magnesium: 2 mg/dL (ref 1.5–2.5)

## 2019-06-15 LAB — MISC LABCORP TEST (SEND OUT): Labcorp test code: 9985

## 2019-06-21 ENCOUNTER — Ambulatory Visit (INDEPENDENT_AMBULATORY_CARE_PROVIDER_SITE_OTHER): Payer: 59 | Admitting: Neurology

## 2019-06-21 ENCOUNTER — Other Ambulatory Visit: Payer: Self-pay

## 2019-06-21 DIAGNOSIS — R569 Unspecified convulsions: Secondary | ICD-10-CM | POA: Diagnosis not present

## 2019-06-21 DIAGNOSIS — R9089 Other abnormal findings on diagnostic imaging of central nervous system: Secondary | ICD-10-CM

## 2019-06-23 ENCOUNTER — Telehealth: Payer: Self-pay | Admitting: Neurology

## 2019-06-23 LAB — MISC LABCORP TEST (SEND OUT): Labcorp test code: 9985

## 2019-06-23 NOTE — Telephone Encounter (Signed)
Discussed EEG results with Kristen Foley and [name]. No epileptiform discharges, there was occasional left temporal focal slowing, much improved compared to her EEG while inpatient. She continues to do well, we discussed reducing Keppra slowly to 1.5 tabs in AM, 2 tabs in PM. She is scheduled for the MRI brain on 3/29, f/u after.

## 2019-06-23 NOTE — Telephone Encounter (Signed)
-----   Message from Azalee Course sent at 06/21/2019  3:07 PM EDT ----- Regarding: EEG to read Her 1-hour routine EEG is ready to read. She said she had several questions for you. I told her she will not be able to talk to you today but a phone message or MyChart message would be best. She said she will try sending her questions via MyChart but did not elaborate as to what these questions were about.

## 2019-06-29 LAB — FUNGUS CULTURE WITH STAIN

## 2019-06-29 LAB — FUNGAL ORGANISM REFLEX

## 2019-06-29 LAB — FUNGUS CULTURE RESULT

## 2019-07-05 ENCOUNTER — Ambulatory Visit
Admission: RE | Admit: 2019-07-05 | Discharge: 2019-07-05 | Disposition: A | Discharge status: Home / Self Care | Payer: 59 | Source: Ambulatory Visit | Attending: Neurology | Admitting: Neurology

## 2019-07-05 ENCOUNTER — Other Ambulatory Visit: Payer: Self-pay

## 2019-07-05 DIAGNOSIS — R9089 Other abnormal findings on diagnostic imaging of central nervous system: Secondary | ICD-10-CM

## 2019-07-05 DIAGNOSIS — R569 Unspecified convulsions: Secondary | ICD-10-CM

## 2019-07-05 MED ORDER — GADOBENATE DIMEGLUMINE 529 MG/ML IV SOLN
9.0000 mL | Freq: Once | INTRAVENOUS | Status: AC | PRN
  Administered 2019-07-05: 9 mL via INTRAVENOUS

## 2019-07-06 ENCOUNTER — Other Ambulatory Visit: Payer: Self-pay | Admitting: Internal Medicine

## 2019-07-07 ENCOUNTER — Encounter: Payer: Self-pay | Admitting: Neurology

## 2019-07-07 ENCOUNTER — Other Ambulatory Visit: Payer: Self-pay

## 2019-07-07 ENCOUNTER — Ambulatory Visit (INDEPENDENT_AMBULATORY_CARE_PROVIDER_SITE_OTHER): Payer: 59 | Admitting: Neurology

## 2019-07-07 VITALS — BP 134/77 | HR 96 | Ht 62.0 in | Wt 104.0 lb

## 2019-07-07 DIAGNOSIS — R569 Unspecified convulsions: Secondary | ICD-10-CM | POA: Diagnosis not present

## 2019-07-07 DIAGNOSIS — C719 Malignant neoplasm of brain, unspecified: Secondary | ICD-10-CM | POA: Diagnosis not present
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
## 2019-07-07 NOTE — Patient Instructions (Signed)
Continue all your medications, follow-up after visit with Neurosurgery. Call for any changes

## 2019-07-12 NOTE — Procedures (Signed)
ELECTROENCEPHALOGRAM REPORT  Date of Study: 06/21/2019  Patient's Name: Kristen Foley MRN: [id number] Date of Birth: Jul 17, 1965  Referring Provider: Dr. Ellouise Newer  Clinical History: This is a 54 year old woman with new onset seizure and abnormal MRI (left temporal lobe changes)  Medications: Keppra  Technical Summary: A multichannel digital 1-hour EEG recording measured by the international 10-20 system with electrodes applied with paste and impedances below 5000 ohms performed in our laboratory with EKG monitoring in an awake and asleep patient.  Hyperventilation was not performed. Photic stimulation was performed.  The digital EEG was referentially recorded, reformatted, and digitally filtered in a variety of bipolar and referential montages for optimal display.    Description: The patient is awake and asleep during the recording.  During maximal wakefulness, there is a symmetric, medium voltage 10-11 Hz posterior dominant rhythm that attenuates with eye opening.  There is occasional focal 5-6 Hz theta slowing seen over the left temporal region. During drowsiness and sleep, there is an increase in theta slowing of the background.  Vertex waves and symmetric sleep spindles were seen.  Photic stimulation did not elicit any abnormalities.  There were no epileptiform discharges or electrographic seizures seen.    EKG lead was unremarkable.  Impression: This 1-hour  awake and asleep EEG is abnormal due to occasional focal slowing over the left temporal region.  Clinical Correlation of the above findings indicates focal cerebral dysfunction over the left temporal region suggestive of underlying structural or physiologic abnormality. There is improvement compared to prior EEG. The absence of epileptiform discharges does not exclude a clinical diagnosis of epilepsy. Clinical correlation is advised.   Ellouise Newer, M.D.

## 2019-07-13 ENCOUNTER — Other Ambulatory Visit: Payer: Self-pay

## 2019-07-13 ENCOUNTER — Telehealth: Payer: Self-pay | Admitting: Neurology

## 2019-07-13 DIAGNOSIS — R9089 Other abnormal findings on diagnostic imaging of central nervous system: Secondary | ICD-10-CM

## 2019-07-13 DIAGNOSIS — R569 Unspecified convulsions: Secondary | ICD-10-CM

## 2019-07-13 NOTE — Telephone Encounter (Signed)
Referral to [facility] needs prior auth from [facility]  Please call and see if we can get the auth for them to go there to see Dr Luiz Ochoa. They are not in the patient network and [facility] told them for [place] to call the insurance comp

## 2019-07-13 NOTE — Telephone Encounter (Signed)
We did not send any referral, she saw the Neurosurgeon in [place] yesterday, I don't have the notes, but it sounds like they want a second opinion from Dr. Luiz Ochoa at [facility]. Pls confirm with her significant other [name] so we can send referral and see what is needed, thanks

## 2019-07-13 NOTE — Telephone Encounter (Signed)
Spoke to [employer] they need a referral for Dr Luiz Ochoa at [place]. Referral was placed and faxed to [phone]

## 2019-07-18 ENCOUNTER — Encounter: Payer: Self-pay | Admitting: Neurology

## 2019-07-22 ENCOUNTER — Telehealth: Payer: Self-pay | Admitting: Neurology

## 2019-07-22 ENCOUNTER — Telehealth: Payer: Self-pay

## 2019-07-22 NOTE — Telephone Encounter (Signed)
Patient's husband called stating patient started having right foot tingling at around 1015 this morning.  Since then patient has had multiple episodes of right leg paresthesias which have gradually been progressing up posterior leg.  She has not had any speech involvement yet however as the episodes continue to progress patient's husband tried calling Dr. Ellouise Newer who is unfortunately out of office.  Therefore they called me.   I recommended giving Ativan 2 mg once, can repeat after 15 minutes if episodes recur. I also recommended increasing Keppra to 2000 mg twice daily.  However am concerned that patient might require a second AED.  Therefore, discussed starting Vimpat.  I called [facility]'s [facility] to: Ativan and Vimpat.  Unfortunately Vimpat requires preauthorization which will take a while.  In the interim, I prescribed oxcarbazepine 150 mg twice daily which can be increased to 300 mg twice daily if needed.  Also informed husband to call 911 and bring patient to ER if her symptoms persist even after taking Ativan or at any point if language gets involved or if patient starts having generalized tonic-clonic seizure-like episodes.  Patient's husband has contact information and knows to contact me if he has any further questions/concerns.  Kaidan Spengler Barbra Sarks

## 2019-07-22 NOTE — Telephone Encounter (Signed)
Called back and checked on patient at around 1450.  Spoke with patient's husband who states that patient took Ativan as well oxcarbazepine and has not had any events anymore.  She is feeling little lethargic but her speech is not slurred and the tingling has resolved.  They also got a call from [facility] regarding the biopsy results and have  follow-up with her neurosurgeon tomorrow morning at 8 AM.   I reiterated the fact that they should come to emergency department if any of her symptoms recur or contact me again.  Kristen Foley

## 2019-07-22 NOTE — Telephone Encounter (Signed)
Foot leg tingling same as February per spouse Harrell Gave. Informed spouse  per nurse advised spouse to take pt to ED.

## 2019-07-29 ENCOUNTER — Ambulatory Visit: Payer: 59 | Attending: Family

## 2019-07-29 DIAGNOSIS — Z23 Encounter for immunization: Secondary | ICD-10-CM

## 2019-07-29 NOTE — Progress Notes (Signed)
   Covid-19 Vaccination Clinic  Name:  Kristen Foley    MRN: [id number] DOB: 05/12/1965  07/29/2019  Kristen Foley was observed post Covid-19 immunization for 15 minutes without incident. She was provided with Vaccine Information Sheet and instruction to access the V-Safe system.   Kristen Foley was instructed to call 911 with any severe reactions post vaccine: Marland Kitchen Difficulty breathing  . Swelling of face and throat  . A fast heartbeat  . A bad rash all over body  . Dizziness and weakness   Immunizations Administered    Name Date Dose VIS Date Route   Moderna COVID-19 Vaccine 07/29/2019  3:09 PM 0.5 mL 03/2019 Intramuscular   Manufacturer: Moderna   Lot: [id number]   [id number]

## 2019-08-02 ENCOUNTER — Ambulatory Visit: Payer: 59 | Attending: Internal Medicine

## 2019-08-02 DIAGNOSIS — Z20822 Contact with and (suspected) exposure to covid-19: Secondary | ICD-10-CM

## 2019-08-03 LAB — NOVEL CORONAVIRUS, NAA: SARS-CoV-2, NAA: NOT DETECTED

## 2019-08-03 LAB — SARS-COV-2, NAA 2 DAY TAT

## 2019-08-19 ENCOUNTER — Telehealth: Payer: Self-pay | Admitting: Neurology

## 2019-08-19 NOTE — Telephone Encounter (Signed)
Patient's husband requested refill for oxcarb. They will be seeing the neurologist in 1 week. 30 day supply of oxcarb 150mg  BID called into patient's pharmacy.   Kristen Foley Barbra Sarks

## 2019-08-24 ENCOUNTER — Encounter: Payer: Self-pay | Admitting: Internal Medicine

## 2019-11-26 DIAGNOSIS — R4701 Aphasia: Secondary | ICD-10-CM | POA: Insufficient documentation

## 2019-12-31 DIAGNOSIS — C719 Malignant neoplasm of brain, unspecified: Secondary | ICD-10-CM | POA: Insufficient documentation
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
## 2020-01-03 ENCOUNTER — Other Ambulatory Visit: Payer: Self-pay

## 2020-01-03 ENCOUNTER — Encounter: Payer: Self-pay | Admitting: Physician Assistant

## 2020-01-03 ENCOUNTER — Ambulatory Visit (INDEPENDENT_AMBULATORY_CARE_PROVIDER_SITE_OTHER): Payer: 59 | Admitting: Physician Assistant

## 2020-01-03 VITALS — BP 128/70 | HR 86 | Temp 98.1°F | Resp 12 | Wt 110.0 lb

## 2020-01-03 DIAGNOSIS — H9311 Tinnitus, right ear: Secondary | ICD-10-CM

## 2020-01-03 DIAGNOSIS — H9190 Unspecified hearing loss, unspecified ear: Secondary | ICD-10-CM | POA: Diagnosis not present

## 2020-01-03 NOTE — Patient Instructions (Addendum)
Will refer to ENT with audiologist for hearing aids  Can do a steroid nasal spary 1-2 sparys at night each nostril.  Examples are nasonex, flonase, nasocort- they are over the counter.  PLEASE CHECK WITH YOUR NEUROLOGIST/ONCOLOGIST  Remember to spray each nostril twice towards the outer part of your eye.   Do not sniff but instead pinch your nose and tilt your head back to help the medicine get into your sinuses.   The best time to do this is at bedtime.  Stop if you get blurred vision or nose bleeds.   THIS WILL TAKE 7 DAYS TO WORK AND IS BETTER IF YOU START BEFORE SYMPTOMS SO IF YOU HAVE A SEASON OR TIME OF THE YEAR YOU ALWAYS GET A COLD, START BEFORE THAT!    Tinnitus Tinnitus refers to hearing a sound when there is no actual source for that sound. This is often described as ringing in the ears. However, people with this condition may hear a variety of noises, in one ear or in both ears. The sounds of tinnitus can be soft, loud, or somewhere in between. Tinnitus can last for a few seconds or can be constant for days. It may go away without treatment and come back at various times. When tinnitus is constant or happens often, it can lead to other problems, such as trouble sleeping and trouble concentrating. Almost everyone experiences tinnitus at some point. Tinnitus that is long-lasting (chronic) or comes back often (recurs) may require medical attention. What are the causes? The cause of tinnitus is often not known. In some cases, it can result from:  Exposure to loud noises from machinery, music, or other sources.  An object (foreign body) stuck in the ear.  Earwax buildup.  Drinking alcohol or caffeine.  Taking certain medicines.  Age-related hearing loss. It may also be caused by medical conditions such as:  Ear or sinus infections.  High blood pressure.  Heart diseases.  Anemia.  Allergies.  Meniere's disease.  Thyroid problems.  Tumors.  A weak, bulging  blood vessel (aneurysm) near the ear. What are the signs or symptoms? The main symptom of tinnitus is hearing a sound when there is no source for that sound. It may sound like:  Buzzing.  Roaring.  Ringing.  Blowing air.  Hissing.  Whistling.  Sizzling.  Humming.  Running water.  A musical note.  Tapping. Symptoms may affect only one ear (unilateral) or both ears (bilateral). How is this diagnosed? Tinnitus is diagnosed based on your symptoms, your medical history, and a physical exam. Your health care provider may do a thorough hearing test (audiologic exam) if your tinnitus:  Is unilateral.  Causes hearing difficulties.  Lasts 6 months or longer. You may work with a health care provider who specializes in hearing disorders (audiologist). You may be asked questions about your symptoms and how they affect your daily life. You may have other tests done, such as:  CT scan.  MRI.  An imaging test of how blood flows through your blood vessels (angiogram). How is this treated? Treating an underlying medical condition can sometimes make tinnitus go away. If your tinnitus continues, other treatments may include:  Medicines.  Therapy and counseling to help you manage the stress of living with tinnitus.  Sound generators to mask the tinnitus. These include: ? Tabletop sound machines that play relaxing sounds to help you fall asleep. ? Wearable devices that fit in your ear and play sounds or music. ? Acoustic neural stimulation.  This involves using headphones to listen to music that contains an auditory signal. Over time, listening to this signal may change some pathways in your brain and make you less sensitive to tinnitus. This treatment is used for very severe cases when no other treatment is working.  Using hearing aids or cochlear implants if your tinnitus is related to hearing loss. Hearing aids are worn in the outer ear. Cochlear implants are surgically placed in the  inner ear. Follow these instructions at home: Managing symptoms      When possible, avoid being in loud places and being exposed to loud sounds.  Wear hearing protection, such as earplugs, when you are exposed to loud noises.  Use a white noise machine, a humidifier, or other devices to mask the sound of tinnitus.  Practice techniques for reducing stress, such as meditation, yoga, or deep breathing. Work with your health care provider if you need help with managing stress.  Sleep with your head slightly raised. This may reduce the impact of tinnitus. General instructions  Do not use stimulants, such as nicotine, alcohol, or caffeine. Talk with your health care provider about other stimulants to avoid. Stimulants are substances that can make you feel alert and attentive by increasing certain activities in the body (such as heart rate and blood pressure). These substances may make tinnitus worse.  Take over-the-counter and prescription medicines only as told by your health care provider.  Try to get plenty of sleep each night.  Keep all follow-up visits as told by your health care provider. This is important. Contact a health care provider if:  Your tinnitus continues for 3 weeks or longer without stopping.  You develop sudden hearing loss.  Your symptoms get worse or do not get better with home care.  You feel you are not able to manage the stress of living with tinnitus. Get help right away if:  You develop tinnitus after a head injury.  You have tinnitus along with any of the following: ? Dizziness. ? Loss of balance. ? Nausea and vomiting. ? Sudden, severe headache. These symptoms may represent a serious problem that is an emergency. Do not wait to see if the symptoms will go away. Get medical help right away. Call your local emergency services (911 in the [place] Do not drive yourself to the hospital. Summary  Tinnitus refers to hearing a sound when there is no actual  source for that sound. This is often described as ringing in the ears.  Symptoms may affect only one ear (unilateral) or both ears (bilateral).  Use a white noise machine, a humidifier, or other devices to mask the sound of tinnitus.  Do not use stimulants, such as nicotine, alcohol, or caffeine. Talk with your health care provider about other stimulants to avoid. These substances may make tinnitus worse. This information is not intended to replace advice given to you by your health care provider. Make sure you discuss any questions you have with your health care provider. Document Revised: 10/07/2018 Document Reviewed: 01/02/2017 Elsevier Patient Education  2020 [place] INFORMATION  Monitor your blood pressure at home, please keep a record and bring that in with you to your next office visit.   Go to the ER if any CP, SOB, nausea, dizziness, severe HA, changes vision/speech  Testing/Procedures: HOW TO TAKE YOUR BLOOD PRESSURE:  Rest 5 minutes before taking your blood pressure.  Don't smoke or drink caffeinated beverages for at least 30 minutes before.  Take  your blood pressure before (not after) you eat.  Sit comfortably with your back supported and both feet on the floor (don't cross your legs).  Elevate your arm to heart level on a table or a desk.  Use the proper sized cuff. It should fit smoothly and snugly around your bare upper arm. There should be enough room to slip a fingertip under the cuff. The bottom edge of the cuff should be 1 inch above the crease of the elbow.  Due to a recent study, SPRINT, we have changed our goal for the systolic or top blood pressure number. Ideally we want your top number at 120.  In the [facility] Trial, 5000 people were randomized to a goal BP of 120 and 5000 people were randomized to a goal BP of less than 140. The patients with the goal BP at 120 had LESS DEMENTIA, LESS HEART ATTACKS, AND LESS STROKES, AS WELL AS OVERALL  DECREASED MORTALITY OR DEATH RATE.   There was another study that showed taking your blood pressure medications at night decrease cardiovascular events.  However if you are on a fluid pill, please take this in the morning.   If you are willing, our goal BP is the top number of 120.  Your most recent BP: BP: 128/70   Take your medications faithfully as instructed. Maintain a healthy weight. Get at least 150 minutes of aerobic exercise per week. Minimize salt intake. Minimize alcohol intake  DASH Eating Plan DASH stands for "Dietary Approaches to Stop Hypertension." The DASH eating plan is a healthy eating plan that has been shown to reduce high blood pressure (hypertension). Additional health benefits may include reducing the risk of type 2 diabetes mellitus, heart disease, and stroke. The DASH eating plan may also help with weight loss. WHAT DO I NEED TO KNOW ABOUT THE DASH EATING PLAN? For the DASH eating plan, you will follow these general guidelines:  Choose foods with a percent daily value for sodium of less than 5% (as listed on the food label).  Use salt-free seasonings or herbs instead of table salt or sea salt.  Check with your health care provider or pharmacist before using salt substitutes.  Eat lower-sodium products, often labeled as "lower sodium" or "no salt added."  Eat fresh foods.  Eat more vegetables, fruits, and low-fat dairy products.  Choose whole grains. Look for the word "whole" as the first word in the ingredient list.  Choose fish and skinless chicken or [place] more often than red meat. Limit fish, poultry, and meat to 6 oz (170 g) each day.  Limit sweets, desserts, sugars, and sugary drinks.  Choose heart-healthy fats.  Limit cheese to 1 oz (28 g) per day.  Eat more home-cooked food and less restaurant, buffet, and fast food.  Limit fried foods.  Cook foods using methods other than frying.  Limit canned vegetables. If you do use them, rinse them  well to decrease the sodium.  When eating at a restaurant, ask that your food be prepared with less salt, or no salt if possible. WHAT FOODS CAN I EAT? Seek help from a dietitian for individual calorie needs. Grains Whole grain or whole wheat bread. Brown rice. Whole grain or whole wheat pasta. Quinoa, bulgur, and whole grain cereals. Low-sodium cereals. Corn or whole wheat flour tortillas. Whole grain cornbread. Whole grain crackers. Low-sodium crackers. Vegetables Fresh or frozen vegetables (raw, steamed, roasted, or grilled). Low-sodium or reduced-sodium tomato and vegetable juices. Low-sodium or reduced-sodium tomato sauce and  paste. Low-sodium or reduced-sodium canned vegetables.  Fruits All fresh, canned (in natural juice), or frozen fruits. Meat and Other Protein Products Ground beef (85% or leaner), grass-fed beef, or beef trimmed of fat. Skinless chicken or [place]. Ground chicken or [place]. Pork trimmed of fat. All fish and seafood. Eggs. Dried beans, peas, or lentils. Unsalted nuts and seeds. Unsalted canned beans. Dairy Low-fat dairy products, such as skim or 1% milk, 2% or reduced-fat cheeses, low-fat ricotta or cottage cheese, or plain low-fat yogurt. Low-sodium or reduced-sodium cheeses. Fats and Oils Tub margarines without trans fats. Light or reduced-fat mayonnaise and salad dressings (reduced sodium). Avocado. Safflower, olive, or canola oils. Natural peanut or almond butter. Other Unsalted popcorn and pretzels. The items listed above may not be a complete list of recommended foods or beverages. Contact your dietitian for more options. WHAT FOODS ARE NOT RECOMMENDED? Grains White bread. White pasta. White rice. Refined cornbread. Bagels and croissants. Crackers that contain trans fat. Vegetables Creamed or fried vegetables. Vegetables in a cheese sauce. Regular canned vegetables. Regular canned tomato sauce and paste. Regular tomato and vegetable juices. Fruits Dried  fruits. Canned fruit in light or heavy syrup. Fruit juice. Meat and Other Protein Products Fatty cuts of meat. Ribs, chicken wings, bacon, sausage, bologna, salami, chitterlings, fatback, hot dogs, bratwurst, and packaged luncheon meats. Salted nuts and seeds. Canned beans with salt. Dairy Whole or 2% milk, cream, half-and-half, and cream cheese. Whole-fat or sweetened yogurt. Full-fat cheeses or blue cheese. Nondairy creamers and whipped toppings. Processed cheese, cheese spreads, or cheese curds. Condiments Onion and garlic salt, seasoned salt, table salt, and sea salt. Canned and packaged gravies. Worcestershire sauce. Tartar sauce. Barbecue sauce. Teriyaki sauce. Soy sauce, including reduced sodium. Steak sauce. Fish sauce. Oyster sauce. Cocktail sauce. Horseradish. Ketchup and mustard. Meat flavorings and tenderizers. Bouillon cubes. Hot sauce. Tabasco sauce. Marinades. Taco seasonings. Relishes. Fats and Oils Butter, stick margarine, lard, shortening, ghee, and bacon fat. Coconut, palm kernel, or palm oils. Regular salad dressings. Other Pickles and olives. Salted popcorn and pretzels. The items listed above may not be a complete list of foods and beverages to avoid. Contact your dietitian for more information. WHERE CAN I FIND MORE INFORMATION? National Heart, Lung, and Blood Institute: travelstabloid.com Document Released: 03/14/2011 Document Revised: 08/09/2013 Document Reviewed: 01/27/2013 [facility] Patient Information 2015 [place], [place]. This information is not intended to replace advice given to you by your health care provider. Make sure you discuss any questions you have with your health care provider.

## 2020-04-05 ENCOUNTER — Other Ambulatory Visit: Payer: Self-pay | Admitting: Internal Medicine

## 2020-04-05 DIAGNOSIS — Z79899 Other long term (current) drug therapy: Secondary | ICD-10-CM

## 2020-04-05 DIAGNOSIS — Z5111 Encounter for antineoplastic chemotherapy: Secondary | ICD-10-CM

## 2020-04-05 DIAGNOSIS — C719 Malignant neoplasm of brain, unspecified: Secondary | ICD-10-CM

## 2020-04-06 ENCOUNTER — Other Ambulatory Visit: Payer: Self-pay

## 2020-04-06 ENCOUNTER — Other Ambulatory Visit: Payer: Self-pay | Admitting: Internal Medicine

## 2020-04-06 ENCOUNTER — Other Ambulatory Visit: Payer: 59

## 2020-04-06 DIAGNOSIS — Z79899 Other long term (current) drug therapy: Secondary | ICD-10-CM

## 2020-04-06 DIAGNOSIS — C719 Malignant neoplasm of brain, unspecified: Secondary | ICD-10-CM

## 2020-04-06 DIAGNOSIS — Z5111 Encounter for antineoplastic chemotherapy: Secondary | ICD-10-CM

## 2020-04-06 LAB — CBC WITH DIFFERENTIAL/PLATELET
Absolute Monocytes: 397 cells/uL (ref 200–950)
Basophils Absolute: 0 cells/uL (ref 0–200)
Basophils Relative: 0 %
Eosinophils Absolute: 20 cells/uL (ref 15–500)
Eosinophils Relative: 0.7 %
HCT: 35.8 % (ref 35.0–45.0)
Hemoglobin: 12.6 g/dL (ref 11.7–15.5)
Lymphs Abs: 519 cells/uL — ABNORMAL LOW (ref 850–3900)
MCH: 35.7 pg — ABNORMAL HIGH (ref 27.0–33.0)
MCHC: 35.2 g/dL (ref 32.0–36.0)
MCV: 101.4 fL — ABNORMAL HIGH (ref 80.0–100.0)
MPV: 9.6 fL (ref 7.5–12.5)
Monocytes Relative: 13.7 %
Neutro Abs: 1963 cells/uL (ref 1500–7800)
Neutrophils Relative %: 67.7 %
Platelets: 85 10*3/uL — ABNORMAL LOW (ref 140–400)
RBC: 3.53 10*6/uL — ABNORMAL LOW (ref 3.80–5.10)
RDW: 11.9 % (ref 11.0–15.0)
Total Lymphocyte: 17.9 %
WBC: 2.9 10*3/uL — ABNORMAL LOW (ref 3.8–10.8)

## 2020-04-06 LAB — COMPLETE METABOLIC PANEL WITH GFR
AG Ratio: 2.1 (calc) (ref 1.0–2.5)
ALT: 12 U/L (ref 6–29)
AST: 15 U/L (ref 10–35)
Albumin: 4.4 g/dL (ref 3.6–5.1)
Alkaline phosphatase (APISO): 74 U/L (ref 37–153)
BUN: 17 mg/dL (ref 7–25)
CO2: 27 mmol/L (ref 20–32)
Calcium: 9.2 mg/dL (ref 8.6–10.4)
Chloride: 105 mmol/L (ref 98–110)
Creat: 0.67 mg/dL (ref 0.50–1.05)
GFR, Est African American: 117 mL/min/{1.73_m2} (ref >=60)
GFR, Est Non African American: 101 mL/min/{1.73_m2} (ref >=60)
Globulin: 2.1 g/dL (calc) (ref 1.9–3.7)
Glucose, Bld: 88 mg/dL (ref 65–99)
Potassium: 4.1 mmol/L (ref 3.5–5.3)
Sodium: 139 mmol/L (ref 135–146)
Total Bilirubin: 0.3 mg/dL (ref 0.2–1.2)
Total Protein: 6.5 g/dL (ref 6.1–8.1)

## 2020-04-06 NOTE — Progress Notes (Signed)
========================================================== -   Test results slightly outside the reference range are not unusual. If there is anything important, I will review this with you,  otherwise it is considered normal test values.  If you have further questions,  please do not hesitate to contact me at the office or via My Chart.  ========================================================== ==========================================================   -  CBC & CMET - Both look OK to continue ChemoTx  ========================================================== ==========================================================

## 2020-04-12 ENCOUNTER — Other Ambulatory Visit: Payer: Self-pay

## 2020-04-12 ENCOUNTER — Other Ambulatory Visit: Payer: Self-pay | Admitting: Internal Medicine

## 2020-04-12 ENCOUNTER — Other Ambulatory Visit: Payer: 59

## 2020-04-12 DIAGNOSIS — C719 Malignant neoplasm of brain, unspecified: Secondary | ICD-10-CM

## 2020-04-12 DIAGNOSIS — Z5111 Encounter for antineoplastic chemotherapy: Secondary | ICD-10-CM

## 2020-04-12 DIAGNOSIS — Z79899 Other long term (current) drug therapy: Secondary | ICD-10-CM

## 2020-04-12 LAB — COMPLETE METABOLIC PANEL WITH GFR
AG Ratio: 1.8 (calc) (ref 1.0–2.5)
ALT: 9 U/L (ref 6–29)
AST: 14 U/L (ref 10–35)
Albumin: 4.2 g/dL (ref 3.6–5.1)
Alkaline phosphatase (APISO): 79 U/L (ref 37–153)
BUN: 17 mg/dL (ref 7–25)
CO2: 29 mmol/L (ref 20–32)
Calcium: 9.3 mg/dL (ref 8.6–10.4)
Chloride: 103 mmol/L (ref 98–110)
Creat: 0.71 mg/dL (ref 0.50–1.05)
GFR, Est African American: 113 mL/min/{1.73_m2} (ref >=60)
GFR, Est Non African American: 98 mL/min/{1.73_m2} (ref >=60)
Globulin: 2.4 g/dL (calc) (ref 1.9–3.7)
Glucose, Bld: 86 mg/dL (ref 65–99)
Potassium: 4.7 mmol/L (ref 3.5–5.3)
Sodium: 137 mmol/L (ref 135–146)
Total Bilirubin: 0.3 mg/dL (ref 0.2–1.2)
Total Protein: 6.6 g/dL (ref 6.1–8.1)

## 2020-04-12 LAB — CBC WITH DIFFERENTIAL/PLATELET
Absolute Monocytes: 374 cells/uL (ref 200–950)
Basophils Absolute: 12 cells/uL (ref 0–200)
Basophils Relative: 0.3 %
Eosinophils Absolute: 20 cells/uL (ref 15–500)
Eosinophils Relative: 0.5 %
HCT: 35.9 % (ref 35.0–45.0)
Hemoglobin: 12.5 g/dL (ref 11.7–15.5)
Lymphs Abs: 406 cells/uL — ABNORMAL LOW (ref 850–3900)
MCH: 35.2 pg — ABNORMAL HIGH (ref 27.0–33.0)
MCHC: 34.8 g/dL (ref 32.0–36.0)
MCV: 101.1 fL — ABNORMAL HIGH (ref 80.0–100.0)
MPV: 9.7 fL (ref 7.5–12.5)
Monocytes Relative: 9.6 %
Neutro Abs: 3089 cells/uL (ref 1500–7800)
Neutrophils Relative %: 79.2 %
Platelets: 99 10*3/uL — ABNORMAL LOW (ref 140–400)
RBC: 3.55 10*6/uL — ABNORMAL LOW (ref 3.80–5.10)
RDW: 12.1 % (ref 11.0–15.0)
Total Lymphocyte: 10.4 %
WBC: 3.9 10*3/uL (ref 3.8–10.8)

## 2020-04-12 NOTE — Progress Notes (Signed)
========================================================== °-   Test results slightly outside the reference range are not unusual. ?If there is anything important, I will review this with you,  ?otherwise it is considered normal test values.  ?If you have further questions,  ?please do not hesitate to contact me at the office or via My Chart.  ?=============================================================== ?=============================================================== ? ? - CBC - Kidneys - Electrolytes - Liver & Magnesium  ? ?- all  Normal / OK ? ?=============================================================== ?=============================================================== ? ?

## 2020-04-12 NOTE — Progress Notes (Signed)
Patient here for lab only 

## 2020-04-27 ENCOUNTER — Other Ambulatory Visit: Payer: Self-pay

## 2020-04-27 ENCOUNTER — Other Ambulatory Visit: Payer: 59

## 2020-04-27 DIAGNOSIS — I10 Essential (primary) hypertension: Secondary | ICD-10-CM

## 2020-04-27 LAB — COMPLETE METABOLIC PANEL WITH GFR
AG Ratio: 1.9 (calc) (ref 1.0–2.5)
ALT: 10 U/L (ref 6–29)
AST: 15 U/L (ref 10–35)
Albumin: 4.4 g/dL (ref 3.6–5.1)
Alkaline phosphatase (APISO): 77 U/L (ref 37–153)
BUN: 18 mg/dL (ref 7–25)
CO2: 28 mmol/L (ref 20–32)
Calcium: 9.5 mg/dL (ref 8.6–10.4)
Chloride: 99 mmol/L (ref 98–110)
Creat: 0.74 mg/dL (ref 0.50–1.05)
GFR, Est African American: 108 mL/min/{1.73_m2} (ref >=60)
GFR, Est Non African American: 93 mL/min/{1.73_m2} (ref >=60)
Globulin: 2.3 g/dL (calc) (ref 1.9–3.7)
Glucose, Bld: 92 mg/dL (ref 65–99)
Potassium: 4.8 mmol/L (ref 3.5–5.3)
Sodium: 134 mmol/L — ABNORMAL LOW (ref 135–146)
Total Bilirubin: 0.3 mg/dL (ref 0.2–1.2)
Total Protein: 6.7 g/dL (ref 6.1–8.1)

## 2020-04-27 LAB — CBC WITH DIFFERENTIAL/PLATELET
Absolute Monocytes: 397 cells/uL (ref 200–950)
Basophils Absolute: 10 cells/uL (ref 0–200)
Basophils Relative: 0.3 %
Eosinophils Absolute: 32 cells/uL (ref 15–500)
Eosinophils Relative: 1 %
HCT: 34.7 % — ABNORMAL LOW (ref 35.0–45.0)
Hemoglobin: 12.1 g/dL (ref 11.7–15.5)
Lymphs Abs: 448 cells/uL — ABNORMAL LOW (ref 850–3900)
MCH: 35.3 pg — ABNORMAL HIGH (ref 27.0–33.0)
MCHC: 34.9 g/dL (ref 32.0–36.0)
MCV: 101.2 fL — ABNORMAL HIGH (ref 80.0–100.0)
MPV: 9.6 fL (ref 7.5–12.5)
Monocytes Relative: 12.4 %
Neutro Abs: 2314 cells/uL (ref 1500–7800)
Neutrophils Relative %: 72.3 %
Platelets: 159 10*3/uL (ref 140–400)
RBC: 3.43 10*6/uL — ABNORMAL LOW (ref 3.80–5.10)
RDW: 12 % (ref 11.0–15.0)
Total Lymphocyte: 14 %
WBC: 3.2 10*3/uL — ABNORMAL LOW (ref 3.8–10.8)

## 2020-04-28 NOTE — Progress Notes (Signed)
Results Faxed to [facility]

## 2020-05-04 ENCOUNTER — Encounter: Payer: BLUE CROSS/BLUE SHIELD | Admitting: Internal Medicine

## 2020-05-16 ENCOUNTER — Ambulatory Visit (INDEPENDENT_AMBULATORY_CARE_PROVIDER_SITE_OTHER): Payer: 59 | Admitting: Internal Medicine

## 2020-05-16 ENCOUNTER — Other Ambulatory Visit: Payer: Self-pay

## 2020-05-16 VITALS — BP 136/82 | HR 78 | Temp 97.2°F | Resp 16 | Ht 63.0 in | Wt 102.4 lb

## 2020-05-16 DIAGNOSIS — Z111 Encounter for screening for respiratory tuberculosis: Secondary | ICD-10-CM | POA: Diagnosis not present

## 2020-05-16 DIAGNOSIS — E559 Vitamin D deficiency, unspecified: Secondary | ICD-10-CM

## 2020-05-16 DIAGNOSIS — Z79899 Other long term (current) drug therapy: Secondary | ICD-10-CM | POA: Diagnosis not present

## 2020-05-16 DIAGNOSIS — Z1389 Encounter for screening for other disorder: Secondary | ICD-10-CM

## 2020-05-16 DIAGNOSIS — Z8249 Family history of ischemic heart disease and other diseases of the circulatory system: Secondary | ICD-10-CM

## 2020-05-16 DIAGNOSIS — R5383 Other fatigue: Secondary | ICD-10-CM

## 2020-05-16 DIAGNOSIS — G40909 Epilepsy, unspecified, not intractable, without status epilepticus: Secondary | ICD-10-CM

## 2020-05-16 DIAGNOSIS — Z131 Encounter for screening for diabetes mellitus: Secondary | ICD-10-CM | POA: Diagnosis not present

## 2020-05-16 DIAGNOSIS — Z1211 Encounter for screening for malignant neoplasm of colon: Secondary | ICD-10-CM

## 2020-05-16 DIAGNOSIS — E782 Mixed hyperlipidemia: Secondary | ICD-10-CM

## 2020-05-16 DIAGNOSIS — Z0000 Encounter for general adult medical examination without abnormal findings: Secondary | ICD-10-CM

## 2020-05-16 DIAGNOSIS — C719 Malignant neoplasm of brain, unspecified: Secondary | ICD-10-CM

## 2020-05-16 DIAGNOSIS — Z130 Encounter for screening for diseases of the blood and blood-forming organs and certain disorders involving the immune mechanism: Secondary | ICD-10-CM | POA: Diagnosis not present

## 2020-05-16 DIAGNOSIS — Z0001 Encounter for general adult medical examination with abnormal findings: Secondary | ICD-10-CM

## 2020-05-16 DIAGNOSIS — Z13220 Encounter for screening for lipoid disorders: Secondary | ICD-10-CM | POA: Diagnosis not present

## 2020-05-16 DIAGNOSIS — R0989 Other specified symptoms and signs involving the circulatory and respiratory systems: Secondary | ICD-10-CM | POA: Diagnosis not present

## 2020-05-16 DIAGNOSIS — R7309 Other abnormal glucose: Secondary | ICD-10-CM

## 2020-05-16 DIAGNOSIS — Z136 Encounter for screening for cardiovascular disorders: Secondary | ICD-10-CM

## 2020-05-16 NOTE — Progress Notes (Signed)
Annual Screening/Preventative Visit & Comprehensive Evaluation &  Examination      This very nice 55 y.o. MWF presents for a Screening /Preventative Visit & comprehensive evaluation and management of multiple medical co-morbidities.  Patient has been followed for  Hx/o labile HTN, HLD, Prediabetes  and Vitamin D Deficiency.         In Feb 2021 , patient was hospitalized with seizures, Right sided Paresthesias, clumsiness of the Right hand and receptive & expressive aphasia. 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 Finally she was seen by Dr Tommi Rumps at [facility] and started on cranial irradiation& chemotherapy.        Patient has been followed expectantly for labile HTN. Patient's BP has been controlled at home and patient denies any cardiac symptoms as chest pain, palpitations, shortness of breath, dizziness or ankle swelling. Today's BP was initially elevated and rechecked at goal 136/82.      Patient's hyperlipidemia is controlled with diet and medications. Patient denies myalgias or other medication SE's. Last lipids were  Lab Results  Component Value Date   CHOL 206 (H) 03/18/2018   HDL 95 03/18/2018   LDLCALC 94 03/18/2018   TRIG 81 03/18/2018   CHOLHDL 2.2 03/18/2018       Patient has monitored expectantly for glucose intolerance and patient denies reactive hypoglycemic symptoms, visual blurring, diabetic polys or paresthesias. Last A1c was normal & at goal:  Lab Results  Component Value Date   HGBA1C 5.0 03/18/2018       Finally, patient has history of Vitamin D Deficiency  ("17" / 2009) and last Vitamin D was still low (goal 70-100):  Lab Results  Component Value Date   VD25OH 46 03/18/2018    Current Outpatient Medications on File Prior to Visit  Medication Sig  . acetaminophen (TYLENOL) 325 MG tablet Take 325-650 mg by mouth  every 6 (six) hours as needed for mild pain or headache.  Marland Kitchen amLODipine (NORVASC) 5 MG tablet Take by mouth.  . Cholecalciferol (VITAMIN D3) 125 MCG (5000 UT) CAPS Take 10,000 Units by mouth daily.  . clonazePAM (KLONOPIN) 0.5 MG tablet 1 tablet/day till 6/23 and then as needed for big seizure or >3 small seizures in a day  . ibuprofen (ADVIL) 200 MG tablet Take 200-400 mg by mouth every 6 (six) hours as needed for headache or mild pain.  . Lacosamide 100 MG TABS Take by mouth.  . Magnesium 500 MG TABS Take 500 mg by mouth 2 (two) times a week.   . Midazolam (NAYZILAM) 5 MG/0.1ML SOLN Place into the nose.  . ondansetron (ZOFRAN) 8 MG tablet Take by mouth.  . OXcarbazepine (TRILEPTAL) 150 MG tablet Take 150 mg by mouth 2 (two) times daily.  Marland Kitchen temozolomide (TEMODAR) 100 MG capsule Take 3 caps (total 300mg ) nightly by mouth days 1-5.Off days 6-28.  Marland Kitchen VIMPAT 150 MG TABS Take 1 tablet by mouth 2 (two) times daily.  Marland Kitchen levETIRAcetam (KEPPRA) 1000 MG tablet Patient taking 1500mg  in AM, 2000mg  in PM)    Allergies  Allergen Reactions  . Lexapro [Escitalopram Oxalate] Other (See Comments)    "it was too strong for me"  . Wellbutrin [Bupropion] Other (See Comments)    "it was too strong for me"  . Zoloft [Sertraline Hcl] Other (See Comments)    "it was too strong for me"  Past Medical History:  Diagnosis Date  . Allergy   . Anemia    Health Maintenance  Topic Date Due  . Hepatitis C Screening  Never done  . TETANUS/TDAP  Never done  . PAP SMEAR-Modifier  Never done  . COVID-19 Vaccine (2 - Moderna risk 4-dose series) 08/26/2019  . INFLUENZA VACCINE  Never done  . MAMMOGRAM  03/09/2020  . Fecal DNA (Cologuard)  04/07/2021  . HIV Screening  Completed   Immunization History  Administered Date(s) Administered  . Moderna Sars-Covid-2 Vaccination 07/29/2019  . PPD Test 03/18/2018, 04/15/2019, 05/16/2020    Cologard - 04/07/2018 - Negative - Recc 3 year f/u due Jan 2023  Last MGM -  03/09/2018 overdue & patient aware  Past Surgical History:  Procedure Laterality Date  . MANDIBLE RECONSTRUCTION  1986   Social History   Tobacco Use  . Smoking status: Never Smoker  . Smokeless tobacco: Never Used  Vaping Use  . Vaping Use: Never used  Substance Use Topics  . Alcohol use: Yes    Comment: glass a wine at night   . Drug use: Never    ROS Constitutional: Denies fever, chills, weight loss/gain, headaches, insomnia,  night sweats, and change in appetite. Does c/o fatigue. Eyes: Denies redness, blurred vision, diplopia, discharge, itchy, watery eyes.  ENT: Denies discharge, congestion, post nasal drip, epistaxis, sore throat, earache, hearing loss, dental pain, Tinnitus, Vertigo, Sinus pain, snoring.  Cardio: Denies chest pain, palpitations, irregular heartbeat, syncope, dyspnea, diaphoresis, orthopnea, PND, claudication, edema Respiratory: denies cough, dyspnea, DOE, pleurisy, hoarseness, laryngitis, wheezing.  Gastrointestinal: Denies dysphagia, heartburn, reflux, water brash, pain, cramps, nausea, vomiting, bloating, diarrhea, constipation, hematemesis, melena, hematochezia, jaundice, hemorrhoids Genitourinary: Denies dysuria, frequency, urgency, nocturia, hesitancy, discharge, hematuria, flank pain Breast: Breast lumps, nipple discharge, bleeding.  Musculoskeletal: Denies arthralgia, myalgia, stiffness, Jt. Swelling, pain, limp, and strain/sprain. Denies falls. Skin: Denies puritis, rash, hives, warts, acne, eczema, changing in skin lesion Neuro: No weakness, tremor, incoordination, spasms, paresthesia, pain Psychiatric: Denies confusion, memory loss, sensory loss. Denies Depression. Endocrine: Denies change in weight, skin, hair change, nocturia, and paresthesia, diabetic polys, visual blurring, hyper / hypo glycemic episodes.  Heme/Lymph: No excessive bleeding, bruising, enlarged lymph nodes.  Physical Exam  BP  136/82  P 78   T97.2 F  R 16   Ht 5\' 3"     Wt  102 lb    SpO2 99%   BMI 18.14    General Appearance: Well nourished, well groomed and in no apparent distress.  Eyes: PERRLA, EOMs, conjunctiva no swelling or erythema, normal fundi and vessels. Sinuses: No frontal/maxillary tenderness ENT/Mouth: EACs patent / TMs  nl. Nares clear without erythema, swelling, mucoid exudates. Oral hygiene is good. No erythema, swelling, or exudate. Tongue normal, non-obstructing. Tonsils not swollen or erythematous. Hearing normal.  Neck: Supple, thyroid not palpable. No bruits, nodes or JVD. Respiratory: Respiratory effort normal.  BS equal and clear bilateral without rales, rhonci, wheezing or stridor. Cardio: Heart sounds are normal with regular rate and rhythm and no murmurs, rubs or gallops. Peripheral pulses are normal and equal bilaterally without edema. No aortic or femoral bruits. Chest: symmetric with normal excursions and percussion. Breasts: Symmetric, without lumps, nipple discharge, retractions, or fibrocystic changes.  Abdomen: Flat, soft with bowel sounds active. Nontender, no guarding, rebound, hernias, masses, or organomegaly.  Lymphatics: Non tender without lymphadenopathy.  Genitourinary:  Musculoskeletal: Full ROM all peripheral extremities, joint stability, 5/5 strength, and normal gait. Skin: Warm and dry without rashes, lesions,  cyanosis, clubbing or  ecchymosis.  Neuro: Cranial nerves intact, reflexes equal bilaterally. Normal muscle tone, no cerebellar symptoms. Sensation intact.  Pysch: Alert and oriented X 3, normal affect, Insight and Judgment appropriate.   Assessment and Plan  1. Annual Preventative Screening Examination   2. Labile hypertension  - EKG 12-Lead - Urinalysis, Routine w reflex microscopic - CBC with Differential/Platelet - COMPLETE METABOLIC PANEL WITH GFR - Magnesium - TSH  3. Hyperlipidemia, mixed  - EKG 12-Lead - Lipid panel - TSH  4. Abnormal glucose  - EKG 12-Lead - Hemoglobin A1c -  Insulin, random  5. Vitamin D deficiency  - VITAMIN D 25 Hydroxy   6. Seizure disorder (HCC)  - Levetiracetam level  7. Anaplastic astrocytoma ([place])   8. Screening for colorectal cancer  - POC Hemoccult Bld/Stl   9. Screening for ischemic heart disease  - EKG 12-Lead  10. FH: hypertension  - EKG 12-Lead  11. Fatigue, unspecified type  - Iron,Total/Total Iron Binding Cap - Vitamin B12 - CBC with Differential/Platelet - TSH  12. Medication management  - Urinalysis, Routine w reflex microscopic - POC Hemoccult Bld/Stl (3-Cd Home Screen); Future - COMPLETE METABOLIC PANEL WITH GFR - Magnesium - Lipid panel - Hemoglobin A1c - Insulin, random - VITAMIN D 25 Hydroxy (Vit-D Deficiency, Fractures) - Levetiracetam level  13. Screening-pulmonary TB  - TB Skin Test        Patient was counseled in prudent diet to achieve/maintain BMI less than 25 for weight control, BP monitoring, regular exercise and medications. Discussed med's effects and SE's. Screening labs and tests as requested with regular follow-up as recommended. Over 40 minutes of exam, counseling, chart review and high complex critical decision making was performed.   Kirtland Bouchard, MD

## 2020-05-16 NOTE — Patient Instructions (Signed)
Due to recent changes in healthcare laws, you may see the results of your imaging and laboratory studies on MyChart before your provider has had a chance to review them.  We understand that in some cases there may be results that are confusing or concerning to you. Not all laboratory results come back in the same time frame and the provider may be waiting for multiple results in order to interpret others.  Please give [place] 48 hours in order for your provider to thoroughly review all the results before contacting the office for clarification of your results.   ++++++++++++++++++++++++++++++  Vit D  & Vit C 1,000 mg   are recommended to help protect  against the Covid-19 and other Corona viruses.    Also it's recommended  to take  Zinc 50 mg  to help  protect against the Covid-19   and best place to get  is also on [email or web address]  and don't pay more than 6-8 cents /pill !  ================================ Coronavirus (COVID-19) Are you at risk?  Are you at risk for the Coronavirus (COVID-19)?  To be considered HIGH RISK for Coronavirus (COVID-19), you have to meet the following criteria:  . Traveled to [place], [place], [place], [place] or [place]; or in the [place] to [place], [place], [place]  . or [place]; and have fever, cough, and shortness of breath within the last 2 weeks of travel OR . Been in close contact with a person diagnosed with COVID-19 within the last 2 weeks and have  . fever, cough,and shortness of breath .  . IF YOU DO NOT MEET THESE CRITERIA, YOU ARE CONSIDERED LOW RISK FOR COVID-19.  What to do if you are HIGH RISK for COVID-19?  Marland Kitchen If you are having a medical emergency, call 911. . Seek medical care right away. Before you go to a doctor's office, urgent care or emergency department, .  call ahead and tell them about your recent travel, contact with someone diagnosed with COVID-19  .  and your symptoms.  . You should receive instructions from your  physician's office regarding next steps of care.  . When you arrive at healthcare provider, tell the healthcare staff immediately you have returned from  . visiting [place], [place], [place], [place] or [place]; or traveled in the [place] to [place], [place],  . [place] or [place] in the last two weeks or you have been in close contact with a person diagnosed with  . COVID-19 in the last 2 weeks.   . Tell the health care staff about your symptoms: fever, cough and shortness of breath. . After you have been seen by a medical provider, you will be either: o Tested for (COVID-19) and discharged home on quarantine except to seek medical care if  o symptoms worsen, and asked to  - Stay home and avoid contact with others until you get your results (4-5 days)  - Avoid travel on public transportation if possible (such as bus, train, or airplane) or o Sent to the Emergency Department by EMS for evaluation, COVID-19 testing  and  o possible admission depending on your condition and test results.  What to do if you are LOW RISK for COVID-19?  Reduce your risk of any infection by using the same precautions used for avoiding the common cold or flu:  Marland Kitchen Wash your hands often with soap and warm water for at least 20 seconds.  If soap and water are not readily  available,  . use an alcohol-based hand sanitizer with at least 60% alcohol.  . If coughing or sneezing, cover your mouth and nose by coughing or sneezing into the elbow areas of your shirt or coat, .  into a tissue or into your sleeve (not your hands). . Avoid shaking hands with others and consider head nods or verbal greetings only. . Avoid touching your eyes, nose, or mouth with unwashed hands.  . Avoid close contact with people who are sick. . Avoid places or events with large numbers of people in one location, like concerts or sporting events. . Carefully consider travel plans you have or are making. . If you are planning any travel  outside or inside the [place], visit the CDC's Travelers' Health webpage for the latest health notices. . If you have some symptoms but not all symptoms, continue to monitor at home and seek medical attention  . if your symptoms worsen. . If you are having a medical emergency, call 911. >>>>>>>>>>>>>>>>>>>>>>> Preventive Care for Adults  A healthy lifestyle and preventive care can promote health and wellness. Preventive health guidelines for women include the following key practices.  A routine yearly physical is a good way to check with your health care provider about your health and preventive screening. It is a chance to share any concerns and updates on your health and to receive a thorough exam.  Visit your dentist for a routine exam and preventive care every 6 months. Brush your teeth twice a day and floss once a day. Good oral hygiene prevents tooth decay and gum disease.  The frequency of eye exams is based on your age, health, family medical history, use of contact lenses, and other factors. Follow your health care provider's recommendations for frequency of eye exams.  Eat a healthy diet. Foods like vegetables, fruits, whole grains, low-fat dairy products, and lean protein foods contain the nutrients you need without too many calories. Decrease your intake of foods high in solid fats, added sugars, and salt. Eat the right amount of calories for you. Get information about a proper diet from your health care provider, if necessary.  Regular physical exercise is one of the most important things you can do for your health. Most adults should get at least 150 minutes of moderate-intensity exercise (any activity that increases your heart rate and causes you to sweat) each week. In addition, most adults need muscle-strengthening exercises on 2 or more days a week.  Maintain a healthy weight. The body mass index (BMI) is a screening tool to identify possible weight problems. It provides an estimate  of body fat based on height and weight. Your health care provider can find your BMI and can help you achieve or maintain a healthy weight. For adults 20 years and older:  A BMI below 18.5 is considered underweight.  A BMI of 18.5 to 24.9 is normal.  A BMI of 25 to 29.9 is considered overweight.  A BMI of 30 and above is considered obese.  Maintain normal blood lipids and cholesterol levels by exercising and minimizing your intake of saturated fat. Eat a balanced diet with plenty of fruit and vegetables. Blood tests for lipids and cholesterol should begin at age 80 and be repeated every 5 years. If your lipid or cholesterol levels are high, you are over 50, or you are at high risk for heart disease, you may need your cholesterol levels checked more frequently. Ongoing high lipid and cholesterol levels should be treated with  medicines if diet and exercise are not working.  If you smoke, find out from your health care provider how to quit. If you do not use tobacco, do not start.  Lung cancer screening is recommended for adults aged 37-80 years who are at high risk for developing lung cancer because of a history of smoking. A yearly low-dose CT scan of the lungs is recommended for people who have at least a 30-pack-year history of smoking and are a current smoker or have quit within the past 15 years. A pack year of smoking is smoking an average of 1 pack of cigarettes a day for 1 year (for example: 1 pack a day for 30 years or 2 packs a day for 15 years). Yearly screening should continue until the smoker has stopped smoking for at least 15 years. Yearly screening should be stopped for people who develop a health problem that would prevent them from having lung cancer treatment.  High blood pressure causes heart disease and increases the risk of stroke. Your blood pressure should be checked at least every 1 to 2 years. Ongoing high blood pressure should be treated with medicines if weight loss and  exercise do not work.  If you are 38-36 years old, ask your health care provider if you should take aspirin to prevent strokes.  Diabetes screening involves taking a blood sample to check your fasting blood sugar level. This should be done once every 3 years, after age 17, if you are within normal weight and without risk factors for diabetes. Testing should be considered at a younger age or be carried out more frequently if you are overweight and have at least 1 risk factor for diabetes.  Breast cancer screening is essential preventive care for women. You should practice "breast self-awareness." This means understanding the normal appearance and feel of your breasts and may include breast self-examination. Any changes detected, no matter how small, should be reported to a health care provider. Women in their 81s and 30s should have a clinical breast exam (CBE) by a health care provider as part of a regular health exam every 1 to 3 years. After age 62, women should have a CBE every year. Starting at age 86, women should consider having a mammogram (breast X-ray test) every year. Women who have a family history of breast cancer should talk to their health care provider about genetic screening. Women at a high risk of breast cancer should talk to their health care providers about having an MRI and a mammogram every year.  Breast cancer gene (BRCA)-related cancer risk assessment is recommended for women who have family members with BRCA-related cancers. BRCA-related cancers include breast, ovarian, tubal, and peritoneal cancers. Having family members with these cancers may be associated with an increased risk for harmful changes (mutations) in the breast cancer genes BRCA1 and BRCA2. Results of the assessment will determine the need for genetic counseling and BRCA1 and BRCA2 testing.  Routine pelvic exams to screen for cancer are no longer recommended for nonpregnant women who are considered low risk for  cancer of the pelvic organs (ovaries, uterus, and vagina) and who do not have symptoms. Ask your health care provider if a screening pelvic exam is right for you.  If you have had past treatment for cervical cancer or a condition that could lead to cancer, you need Pap tests and screening for cancer for at least 20 years after your treatment. If Pap tests have been discontinued, your risk factors (such  as having a new sexual partner) need to be reassessed to determine if screening should be resumed. Some women have medical problems that increase the chance of getting cervical cancer. In these cases, your health care provider may recommend more frequent screening and Pap tests.  Colorectal cancer can be detected and often prevented. Most routine colorectal cancer screening begins at the age of 45 years and continues through age 26 years. However, your health care provider may recommend screening at an earlier age if you have risk factors for colon cancer. On a yearly basis, your health care provider may provide home test kits to check for hidden blood in the stool. Use of a small camera at the end of a tube, to directly examine the colon (sigmoidoscopy or colonoscopy), can detect the earliest forms of colorectal cancer. Talk to your health care provider about this at age 60, when routine screening begins.  Direct exam of the colon should be repeated every 5-10 years through age 13 years, unless early forms of pre-cancerous polyps or small growths are found.  Hepatitis C blood testing is recommended for all people born from [birth year] through 1965 and any individual with known risks for hepatitis C.  Pra  Osteoporosis is a disease in which the bones lose minerals and strength with aging. This can result in serious bone fractures or breaks. The risk of osteoporosis can be identified using a bone density scan. Women ages 14 years and over and women at risk for fractures or osteoporosis should discuss screening with  their health care providers. Ask your health care provider whether you should take a calcium supplement or vitamin D to reduce the rate of osteoporosis.  Menopause can be associated with physical symptoms and risks. Hormone replacement therapy is available to decrease symptoms and risks. You should talk to your health care provider about whether hormone replacement therapy is right for you.  Use sunscreen. Apply sunscreen liberally and repeatedly throughout the day. You should seek shade when your shadow is shorter than you. Protect yourself by wearing long sleeves, pants, a wide-brimmed hat, and sunglasses year round, whenever you are outdoors.  Once a month, do a whole body skin exam, using a mirror to look at the skin on your back. Tell your health care provider of new moles, moles that have irregular borders, moles that are larger than a pencil eraser, or moles that have changed in shape or color.  Stay current with required vaccines (immunizations).  Influenza vaccine. All adults should be immunized every year.  Tetanus, diphtheria, and acellular pertussis (Td, Tdap) vaccine. Pregnant women should receive 1 dose of Tdap vaccine during each pregnancy. The dose should be obtained regardless of the length of time since the last dose. Immunization is preferred during the 27th-36th week of gestation. An adult who has not previously received Tdap or who does not know her vaccine status should receive 1 dose of Tdap. This initial dose should be followed by tetanus and diphtheria toxoids (Td) booster doses every 10 years. Adults with an unknown or incomplete history of completing a 3-dose immunization series with Td-containing vaccines should begin or complete a primary immunization series including a Tdap dose. Adults should receive a Td booster every 10 years.  Varicella vaccine. An adult without evidence of immunity to varicella should receive 2 doses or a second dose if she has previously received 1  dose. Pregnant females who do not have evidence of immunity should receive the first dose after pregnancy. This first dose  should be obtained before leaving the health care facility. The second dose should be obtained 4-8 weeks after the first dose.  Human papillomavirus (HPV) vaccine. Females aged 13-26 years who have not received the vaccine previously should obtain the 3-dose series. The vaccine is not recommended for use in pregnant females. However, pregnancy testing is not needed before receiving a dose. If a female is found to be pregnant after receiving a dose, no treatment is needed. In that case, the remaining doses should be delayed until after the pregnancy. Immunization is recommended for any person with an immunocompromised condition through the age of 4 years if she did not get any or all doses earlier. During the 3-dose series, the second dose should be obtained 4-8 weeks after the first dose. The third dose should be obtained 24 weeks after the first dose and 16 weeks after the second dose.  Zoster vaccine. One dose is recommended for adults aged 9 years or older unless certain conditions are present.  Measles, mumps, and rubella (MMR) vaccine. Adults born before [birth year] generally are considered immune to measles and mumps. Adults born in [birth year] or later should have 1 or more doses of MMR vaccine unless there is a contraindication to the vaccine or there is laboratory evidence of immunity to each of the three diseases. A routine second dose of MMR vaccine should be obtained at least 28 days after the first dose for students attending postsecondary schools, health care workers, or international travelers. People who received inactivated measles vaccine or an unknown type of measles vaccine during 1963-1967 should receive 2 doses of MMR vaccine. People who received inactivated mumps vaccine or an unknown type of mumps vaccine before 1979 and are at high risk for mumps infection should consider  immunization with 2 doses of MMR vaccine. For females of childbearing age, rubella immunity should be determined. If there is no evidence of immunity, females who are not pregnant should be vaccinated. If there is no evidence of immunity, females who are pregnant should delay immunization until after pregnancy. Unvaccinated health care workers born before [birth year] who lack laboratory evidence of measles, mumps, or rubella immunity or laboratory confirmation of disease should consider measles and mumps immunization with 2 doses of MMR vaccine or rubella immunization with 1 dose of MMR vaccine.  Pneumococcal 13-valent conjugate (PCV13) vaccine. When indicated, a person who is uncertain of her immunization history and has no record of immunization should receive the PCV13 vaccine. An adult aged 48 years or older who has certain medical conditions and has not been previously immunized should receive 1 dose of PCV13 vaccine. This PCV13 should be followed with a dose of pneumococcal polysaccharide (PPSV23) vaccine. The PPSV23 vaccine dose should be obtained at least 1 or more year(s) after the dose of PCV13 vaccine. An adult aged 57 years or older who has certain medical conditions and previously received 1 or more doses of PPSV23 vaccine should receive 1 dose of PCV13. The PCV13 vaccine dose should be obtained 1 or more years after the last PPSV23 vaccine dose.    Pneumococcal polysaccharide (PPSV23) vaccine. When PCV13 is also indicated, PCV13 should be obtained first. All adults aged 34 years and older should be immunized. An adult younger than age 60 years who has certain medical conditions should be immunized. Any person who resides in a nursing home or long-term care facility should be immunized. An adult smoker should be immunized. People with an immunocompromised condition and certain other conditions should receive both  PCV13 and PPSV23 vaccines. People with human immunodeficiency virus (HIV) infection should  be immunized as soon as possible after diagnosis. Immunization during chemotherapy or radiation therapy should be avoided. Routine use of PPSV23 vaccine is not recommended for American Indians, [place] Natives, or people younger than 65 years unless there are medical conditions that require PPSV23 vaccine. When indicated, people who have unknown immunization and have no record of immunization should receive PPSV23 vaccine. One-time revaccination 5 years after the first dose of PPSV23 is recommended for people aged 19-64 years who have chronic kidney failure, nephrotic syndrome, asplenia, or immunocompromised conditions. People who received 1-2 doses of PPSV23 before age 26 years should receive another dose of PPSV23 vaccine at age 48 years or later if at least 5 years have passed since the previous dose. Doses of PPSV23 are not needed for people immunized with PPSV23 at or after age 36 years.  Preventive Services / Frequency   Ages 63 to 16 years  Blood pressure check.  Lipid and cholesterol check.  Lung cancer screening. / Every year if you are aged 65-80 years and have a 30-pack-year history of smoking and currently smoke or have quit within the past 15 years. Yearly screening is stopped once you have quit smoking for at least 15 years or develop a health problem that would prevent you from having lung cancer treatment.  Clinical breast exam.** / Every year after age 64 years.   BRCA-related cancer risk assessment.** / For women who have family members with a BRCA-related cancer (breast, ovarian, tubal, or peritoneal cancers).  Mammogram.** / Every year beginning at age 59 years and continuing for as long as you are in good health. Consult with your health care provider.  Pap test.** / Every 3 years starting at age 49 years through age 33 or 108 years with a history of 3 consecutive normal Pap tests.  HPV screening.** / Every 3 years from ages 11 years through ages 69 to 44 years with a history  of 3 consecutive normal Pap tests.  Fecal occult blood test (FOBT) of stool. / Every year beginning at age 67 years and continuing until age 73 years. You may not need to do this test if you get a colonoscopy every 10 years.  Flexible sigmoidoscopy or colonoscopy.** / Every 5 years for a flexible sigmoidoscopy or every 10 years for a colonoscopy beginning at age 30 years and continuing until age 66 years.  Hepatitis C blood test.** / For all people born from [birth year] through 1963 and any individual with known risks for hepatitis C.  Skin self-exam. / Monthly.  Influenza vaccine. / Every year.  Tetanus, diphtheria, and acellular pertussis (Tdap/Td) vaccine.** / Consult your health care provider. Pregnant women should receive 1 dose of Tdap vaccine during each pregnancy. 1 dose of Td every 10 years.  Varicella vaccine.** / Consult your health care provider. Pregnant females who do not have evidence of immunity should receive the first dose after pregnancy.  Zoster vaccine.** / 1 dose for adults aged 60 years or older.  Pneumococcal 13-valent conjugate (PCV13) vaccine.** / Consult your health care provider.  Pneumococcal polysaccharide (PPSV23) vaccine.** / 1 to 2 doses if you smoke cigarettes or if you have certain conditions.  Meningococcal vaccine.** / Consult your health care provider.  Hepatitis A vaccine.** / Consult your health care provider.  Hepatitis B vaccine.** / Consult your health care provider. Screening for abdominal aortic aneurysm (AAA)  by ultrasound is recommended for people over 50  who have history of high blood pressure or who are current or former smokers. ++++++++++++++++++ Recommend Adult Low Dose Aspirin or  coated  Aspirin 81 mg daily  To reduce risk of Colon Cancer 40 %,  Skin Cancer 26 % ,  Melanoma 46%  and  Pancreatic cancer 60% +++++++++++++++++++ Vitamin D goal  is between 70-100.  Please make sure that you are taking your Vitamin D as directed.  It  is very important as a natural anti-inflammatory  helping hair, skin, and nails, as well as reducing stroke and heart attack risk.  It helps your bones and helps with mood. It also decreases numerous cancer risks so please take it as directed.  Low Vit D is associated with a 200-300% higher risk for CANCER  and 200-300% higher risk for HEART   ATTACK  &  STROKE.   .....................................Marland Kitchen It is also associated with higher death rate at younger ages,  autoimmune diseases like Rheumatoid arthritis, Lupus, Multiple Sclerosis.    Also many other serious conditions, like depression, Alzheimer's Dementia, infertility, muscle aches, fatigue, fibromyalgia - just to name a few. ++++++++++++++++++ Recommend the book "The END of DIETING" by Dr Excell Seltzer  & the book "The END of DIABETES " by Dr Excell Seltzer At [email or web address] - get book & Audio CD's    Being diabetic has a  300% increased risk for heart attack, stroke, cancer, and alzheimer- type vascular dementia. It is very important that you work harder with diet by avoiding all foods that are white. Avoid white rice (brown & wild rice is OK), white potatoes (sweetpotatoes in moderation is OK), White bread or wheat bread or anything made out of white flour like bagels, donuts, rolls, buns, biscuits, cakes, pastries, cookies, pizza crust, and pasta (made from white flour & egg whites) - vegetarian pasta or spinach or wheat pasta is OK. Multigrain breads like Arnold's or [employer], or multigrain sandwich thins or flatbreads.  Diet, exercise and weight loss can reverse and cure diabetes in the early stages.  Diet, exercise and weight loss is very important in the control and prevention of complications of diabetes which affects every system in your body, ie. Brain - dementia/stroke, eyes - glaucoma/blindness, heart - heart attack/heart failure, kidneys - dialysis, stomach - gastric paralysis, intestines - malabsorption, nerves - severe painful  neuritis, circulation - gangrene & loss of a leg(s), and finally cancer and Alzheimers.    I recommend avoid fried & greasy foods,  sweets/candy, white rice (brown or wild rice or Quinoa is OK), white potatoes (sweet potatoes are OK) - anything made from white flour - bagels, doughnuts, rolls, buns, biscuits,white and wheat breads, pizza crust and traditional pasta made of white flour & egg white(vegetarian pasta or spinach or wheat pasta is OK).  Multi-grain bread is OK - like multi-grain flat bread or sandwich thins. Avoid alcohol in excess. Exercise is also important.    Eat all the vegetables you want - avoid meat, especially red meat and dairy - especially cheese.  Cheese is the most concentrated form of trans-fats which is the worst thing to clog up our arteries. Veggie cheese is OK which can be found in the fresh produce section at [employer] or [employer] or [employer]  ++++++++++++++++++++++ DASH Eating Plan  DASH stands for "Dietary Approaches to Stop Hypertension."   The DASH eating plan is a healthy eating plan that has been shown to reduce high blood pressure (hypertension). Additional health benefits may include reducing the  risk of type 2 diabetes mellitus, heart disease, and stroke. The DASH eating plan may also help with weight loss. WHAT DO I NEED TO KNOW ABOUT THE DASH EATING PLAN? For the DASH eating plan, you will follow these general guidelines:  Choose foods with a percent daily value for sodium of less than 5% (as listed on the food label).  Use salt-free seasonings or herbs instead of table salt or sea salt.  Check with your health care provider or pharmacist before using salt substitutes.  Eat lower-sodium products, often labeled as "lower sodium" or "no salt added."  Eat fresh foods.  Eat more vegetables, fruits, and low-fat dairy products.  Choose whole grains. Look for the word "whole" as the first word in the ingredient list.  Choose fish   Limit  sweets, desserts, sugars, and sugary drinks.  Choose heart-healthy fats.  Eat veggie cheese   Eat more home-cooked food and less restaurant, buffet, and fast food.  Limit fried foods.  Cook foods using methods other than frying.  Limit canned vegetables. If you do use them, rinse them well to decrease the sodium.  When eating at a restaurant, ask that your food be prepared with less salt, or no salt if possible.                      WHAT FOODS CAN I EAT? Read Dr Fara Olden Fuhrman's books on The End of Dieting & The End of Diabetes  Grains Whole grain or whole wheat bread. Brown rice. Whole grain or whole wheat pasta. Quinoa, bulgur, and whole grain cereals. Low-sodium cereals. Corn or whole wheat flour tortillas. Whole grain cornbread. Whole grain crackers. Low-sodium crackers.  Vegetables Fresh or frozen vegetables (raw, steamed, roasted, or grilled). Low-sodium or reduced-sodium tomato and vegetable juices. Low-sodium or reduced-sodium tomato sauce and paste. Low-sodium or reduced-sodium canned vegetables.   Fruits All fresh, canned (in natural juice), or frozen fruits.  Protein Products  All fish and seafood.  Dried beans, peas, or lentils. Unsalted nuts and seeds. Unsalted canned beans.  Dairy Low-fat dairy products, such as skim or 1% milk, 2% or reduced-fat cheeses, low-fat ricotta or cottage cheese, or plain low-fat yogurt. Low-sodium or reduced-sodium cheeses.  Fats and Oils Tub margarines without trans fats. Light or reduced-fat mayonnaise and salad dressings (reduced sodium). Avocado. Safflower, olive, or canola oils. Natural peanut or almond butter.  Other Unsalted popcorn and pretzels. The items listed above may not be a complete list of recommended foods or beverages. Contact your dietitian for more options.  ++++++++++++++++++  WHAT FOODS ARE NOT RECOMMENDED? Grains/ White flour or wheat flour White bread. White pasta. White rice. Refined cornbread. Bagels and  croissants. Crackers that contain trans fat.  Vegetables  Creamed or fried vegetables. Vegetables in a . Regular canned vegetables. Regular canned tomato sauce and paste. Regular tomato and vegetable juices.  Fruits Dried fruits. Canned fruit in light or heavy syrup. Fruit juice.  Meat and Other Protein Products Meat in general - RED meat & White meat.  Fatty cuts of meat. Ribs, chicken wings, all processed meats as bacon, sausage, bologna, salami, fatback, hot dogs, bratwurst and packaged luncheon meats.  Dairy Whole or 2% milk, cream, half-and-half, and cream cheese. Whole-fat or sweetened yogurt. Full-fat cheeses or blue cheese. Non-dairy creamers and whipped toppings. Processed cheese, cheese spreads, or cheese curds.  Condiments Onion and garlic salt, seasoned salt, table salt, and sea salt. Canned and packaged gravies. Worcestershire sauce. Tartar sauce.  Barbecue sauce. Teriyaki sauce. Soy sauce, including reduced sodium. Steak sauce. Fish sauce. Oyster sauce. Cocktail sauce. Horseradish. Ketchup and mustard. Meat flavorings and tenderizers. Bouillon cubes. Hot sauce. Tabasco sauce. Marinades. Taco seasonings. Relishes.  Fats and Oils Butter, stick margarine, lard, shortening and bacon fat. Coconut, palm kernel, or palm oils. Regular salad dressings.  Pickles and olives. Salted popcorn and pretzels.  The items listed above may not be a complete list of foods and beverages to avoid.

## 2020-05-17 LAB — URINALYSIS, ROUTINE W REFLEX MICROSCOPIC
Bacteria, UA: NONE SEEN /HPF
Bilirubin Urine: NEGATIVE
Glucose, UA: NEGATIVE
Hgb urine dipstick: NEGATIVE
Hyaline Cast: NONE SEEN /LPF
Ketones, ur: NEGATIVE
Nitrite: NEGATIVE
Protein, ur: NEGATIVE
Specific Gravity, Urine: 1.01 (ref 1.001–1.03)
Squamous Epithelial / HPF: >=28 /HPF — AB (ref <=5)
pH: 7.5 (ref 5.0–8.0)

## 2020-05-17 LAB — CBC WITH DIFFERENTIAL/PLATELET
Absolute Monocytes: 592 cells/uL (ref 200–950)
Basophils Absolute: 10 cells/uL (ref 0–200)
Basophils Relative: 0.3 %
Eosinophils Absolute: 20 cells/uL (ref 15–500)
Eosinophils Relative: 0.6 %
HCT: 34.7 % — ABNORMAL LOW (ref 35.0–45.0)
Hemoglobin: 12 g/dL (ref 11.7–15.5)
Lymphs Abs: 381 cells/uL — ABNORMAL LOW (ref 850–3900)
MCH: 35.1 pg — ABNORMAL HIGH (ref 27.0–33.0)
MCHC: 34.6 g/dL (ref 32.0–36.0)
MCV: 101.5 fL — ABNORMAL HIGH (ref 80.0–100.0)
MPV: 9.6 fL (ref 7.5–12.5)
Monocytes Relative: 17.4 %
Neutro Abs: 2397 cells/uL (ref 1500–7800)
Neutrophils Relative %: 70.5 %
Platelets: 175 10*3/uL (ref 140–400)
RBC: 3.42 10*6/uL — ABNORMAL LOW (ref 3.80–5.10)
RDW: 11.8 % (ref 11.0–15.0)
Total Lymphocyte: 11.2 %
WBC: 3.4 10*3/uL — ABNORMAL LOW (ref 3.8–10.8)

## 2020-05-17 LAB — HEMOGLOBIN A1C
Hgb A1c MFr Bld: 5.2 % of total Hgb (ref <5.7)
Mean Plasma Glucose: 103 mg/dL
eAG (mmol/L): 5.7 mmol/L

## 2020-05-17 LAB — IRON, TOTAL/TOTAL IRON BINDING CAP
%SAT: 27 % (calc) (ref 16–45)
Iron: 70 ug/dL (ref 45–160)
TIBC: 263 mcg/dL (calc) (ref 250–450)

## 2020-05-17 LAB — MAGNESIUM: Magnesium: 2.1 mg/dL (ref 1.5–2.5)

## 2020-05-17 LAB — COMPLETE METABOLIC PANEL WITH GFR
AG Ratio: 1.7 (calc) (ref 1.0–2.5)
ALT: 10 U/L (ref 6–29)
AST: 14 U/L (ref 10–35)
Albumin: 4.1 g/dL (ref 3.6–5.1)
Alkaline phosphatase (APISO): 81 U/L (ref 37–153)
BUN: 14 mg/dL (ref 7–25)
CO2: 27 mmol/L (ref 20–32)
Calcium: 9.1 mg/dL (ref 8.6–10.4)
Chloride: 102 mmol/L (ref 98–110)
Creat: 0.64 mg/dL (ref 0.50–1.05)
GFR, Est African American: 119 mL/min/{1.73_m2} (ref >=60)
GFR, Est Non African American: 103 mL/min/{1.73_m2} (ref >=60)
Globulin: 2.4 g/dL (calc) (ref 1.9–3.7)
Glucose, Bld: 90 mg/dL (ref 65–99)
Potassium: 4.5 mmol/L (ref 3.5–5.3)
Sodium: 136 mmol/L (ref 135–146)
Total Bilirubin: 0.3 mg/dL (ref 0.2–1.2)
Total Protein: 6.5 g/dL (ref 6.1–8.1)

## 2020-05-17 LAB — VITAMIN D 25 HYDROXY (VIT D DEFICIENCY, FRACTURES): Vit D, 25-Hydroxy: 44 ng/mL (ref 30–100)

## 2020-05-17 LAB — LIPID PANEL
Cholesterol: 213 mg/dL — ABNORMAL HIGH (ref <200)
HDL: 67 mg/dL (ref >=50)
LDL Cholesterol (Calc): 121 mg/dL (calc) — ABNORMAL HIGH
Non-HDL Cholesterol (Calc): 146 mg/dL (calc) — ABNORMAL HIGH (ref <130)
Total CHOL/HDL Ratio: 3.2 (calc) (ref <5.0)
Triglycerides: 141 mg/dL (ref <150)

## 2020-05-17 LAB — TSH: TSH: 1.79 mIU/L

## 2020-05-17 LAB — VITAMIN B12: Vitamin B-12: 363 pg/mL (ref 200–1100)

## 2020-05-17 LAB — INSULIN, RANDOM: Insulin: 4 u[IU]/mL

## 2020-05-17 NOTE — Progress Notes (Signed)
========================================================== - Test results slightly outside the reference range are not unusual. If there is anything important, I will review this with you,  otherwise it is considered normal test values.  If you have further questions,  please do not hesitate to contact me at the office or via My Chart.  ========================================================== ==========================================================  -  Iron level is Normal   ========================================================== ==========================================================  -  Vitamin B12 =  363    Very Low  (Ideal or Goal Vit B12 is between 450 - 1,100)   Low Vit B12 may be associated with Anemia , Fatigue,   Peripheral Neuropathy, Dementia, "Brain Fog", & Depression  - Recommend take a sub-lingual form of Vitamin B12 tablet   1,000 to 5,000 mcg tab that you dissolve under your tongue /Daily   - Can get Baron Sane - best price at [employer] or on [employer]  ========================================================== ==========================================================  -  Total Chol = 213  - elevated (Ideal or [place].l is less than 180)   - and   - LDL Chol = 121 -  Elevated (Ideal or Goal is less than 70)  - So. . . . .  - Recommend a stricter low cholesterol diet   - Cholesterol only comes from animal sources  - ie. meat, dairy, egg yolks  - Eat all the vegetables you want.  - Avoid meat, especially red meat - Beef AND Pork .  - Avoid cheese & dairy - milk & ice cream.     - Cheese is the most concentrated form of trans-fats which  is the worst thing to clog up our arteries.   - Veggie cheese is OK which can be found in the fresh  produce section at [employer] or [employer] or [employer] ========================================================== ==========================================================  -  After you finish your  treatments and with a "better" diet,                                                                        we can recheck in 3-6 months   - To determine whether diet will sufficiently improve or whether you may                                                                  need to start meds for Cholesterol  ========================================================== ==========================================================  -  A1c - Normal - Great - No Diabetes  ! ========================================================== ==========================================================  -  Vitamin D = 44 - Low   -- Vitamin D goal is between 70-100.   - Please make sure that you are taking your Vitamin D as directed.   - It is very important as a natural anti-inflammatory and helping the  immune system protect against viral infections, like the Covid-19    helping hair, skin, and nails, as well as reducing stroke and  heart attack risk.   - It helps your bones and helps with mood.  - It also decreases numerous cancer risks so please  take it as directed.   -  Low Vit D is associated with a 200-300% higher risk for  CANCER   and 200-300% higher risk for HEART   ATTACK  &  STROKE.    - It is also associated with higher death rate at younger ages,   autoimmune diseases like Rheumatoid arthritis, Lupus,  Multiple Sclerosis.     - Also many other serious conditions, like depression, Alzheimer's  Dementia, infertility, muscle aches, fatigue, fibromyalgia   - just to name a few. ========================================================== ==========================================================  -  All Else - CBC - Kidneys - Electrolytes - Liver - Magnesium & Thyroid    - all  Normal / OK ===========================================================

## 2020-05-20 ENCOUNTER — Encounter: Payer: Self-pay | Admitting: Internal Medicine

## 2020-05-20 LAB — LEVETIRACETAM LEVEL: Keppra (Levetiracetam): 41.3 ug/mL (ref 12.0–46.0)

## 2020-05-20 NOTE — Progress Notes (Signed)
========================================================== ==========================================================  -    Keppra level returned OK  - Please keep dose same   ========================================================== ==========================================================

## 2020-05-26 ENCOUNTER — Other Ambulatory Visit: Payer: 59

## 2020-05-26 ENCOUNTER — Other Ambulatory Visit: Payer: Self-pay

## 2020-05-26 DIAGNOSIS — C719 Malignant neoplasm of brain, unspecified: Secondary | ICD-10-CM | POA: Diagnosis not present

## 2020-05-26 DIAGNOSIS — Z79899 Other long term (current) drug therapy: Secondary | ICD-10-CM | POA: Diagnosis not present

## 2020-05-26 DIAGNOSIS — Z5111 Encounter for antineoplastic chemotherapy: Secondary | ICD-10-CM

## 2020-05-26 LAB — COMPLETE METABOLIC PANEL WITH GFR
AG Ratio: 1.9 (calc) (ref 1.0–2.5)
ALT: 8 U/L (ref 6–29)
AST: 15 U/L (ref 10–35)
Albumin: 4.2 g/dL (ref 3.6–5.1)
Alkaline phosphatase (APISO): 90 U/L (ref 37–153)
BUN: 17 mg/dL (ref 7–25)
CO2: 27 mmol/L (ref 20–32)
Calcium: 9.1 mg/dL (ref 8.6–10.4)
Chloride: 101 mmol/L (ref 98–110)
Creat: 0.7 mg/dL (ref 0.50–1.05)
GFR, Est African American: 115 mL/min/{1.73_m2} (ref >=60)
GFR, Est Non African American: 100 mL/min/{1.73_m2} (ref >=60)
Globulin: 2.2 g/dL (calc) (ref 1.9–3.7)
Glucose, Bld: 80 mg/dL (ref 65–99)
Potassium: 4.5 mmol/L (ref 3.5–5.3)
Sodium: 135 mmol/L (ref 135–146)
Total Bilirubin: 0.5 mg/dL (ref 0.2–1.2)
Total Protein: 6.4 g/dL (ref 6.1–8.1)

## 2020-05-26 LAB — CBC WITH DIFFERENTIAL/PLATELET
Absolute Monocytes: 361 cells/uL (ref 200–950)
Basophils Absolute: 11 cells/uL (ref 0–200)
Basophils Relative: 0.4 %
Eosinophils Absolute: 31 cells/uL (ref 15–500)
Eosinophils Relative: 1.1 %
HCT: 34.9 % — ABNORMAL LOW (ref 35.0–45.0)
Hemoglobin: 12.1 g/dL (ref 11.7–15.5)
Lymphs Abs: 302 cells/uL — ABNORMAL LOW (ref 850–3900)
MCH: 35.3 pg — ABNORMAL HIGH (ref 27.0–33.0)
MCHC: 34.7 g/dL (ref 32.0–36.0)
MCV: 101.7 fL — ABNORMAL HIGH (ref 80.0–100.0)
MPV: 9.5 fL (ref 7.5–12.5)
Monocytes Relative: 12.9 %
Neutro Abs: 2094 cells/uL (ref 1500–7800)
Neutrophils Relative %: 74.8 %
Platelets: 149 10*3/uL (ref 140–400)
RBC: 3.43 10*6/uL — ABNORMAL LOW (ref 3.80–5.10)
RDW: 11.9 % (ref 11.0–15.0)
Total Lymphocyte: 10.8 %
WBC: 2.8 10*3/uL — ABNORMAL LOW (ref 3.8–10.8)

## 2020-05-26 NOTE — Progress Notes (Signed)
========================================================== ==========================================================  -    OBTW - thanks for the Green Tea - we're all enjoying it  !  - I used to take Green tea extract capsules for the anti - cancer effect  until   I found out that I could ger "EGCg" capsules on [employer]   ( EGCg = Epigallocatechin Gallate ) ========================================================== ==========================================================

## 2020-05-26 NOTE — Progress Notes (Signed)
========================================================== -   Test results slightly outside the reference range are not unusual. If there is anything important, I will review this with you,  otherwise it is considered normal test values.  If you have further questions,  please do not hesitate to contact me at the office or via My Chart.  ==========================================================  -  WBC are low from the ChemoTx - all else OK   -  Results faxed to [facility]

## 2020-05-29 ENCOUNTER — Telehealth: Payer: Self-pay | Admitting: *Deleted

## 2020-05-29 NOTE — Telephone Encounter (Signed)
Requested labs faxed to [facility].

## 2020-06-25 IMAGING — US US PELVIS COMPLETE WITH TRANSVAGINAL
1 series · 13 of 25 positions shown · non-contrast
Comparison: CT dated 06/02/2019

CLINICAL DATA: Ovarian cyst



[Series 1: us pelvis complete with transvaginal · 98 acquisitions, 13 frames shown]
[im 1/98]
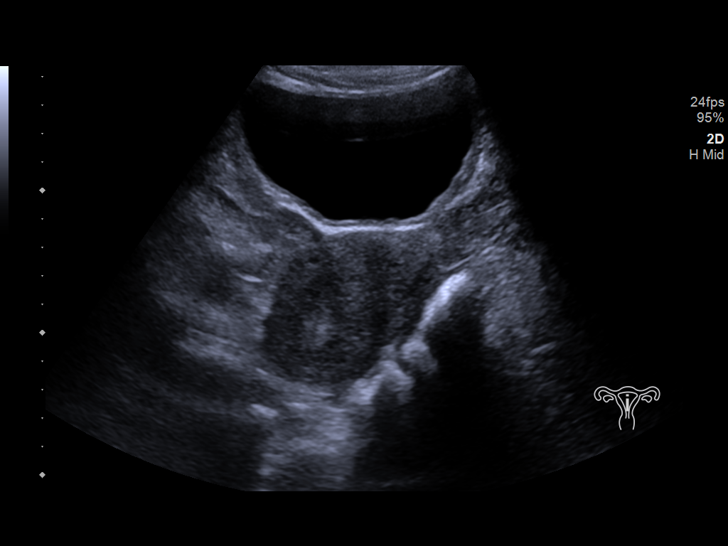
[im 9/98]
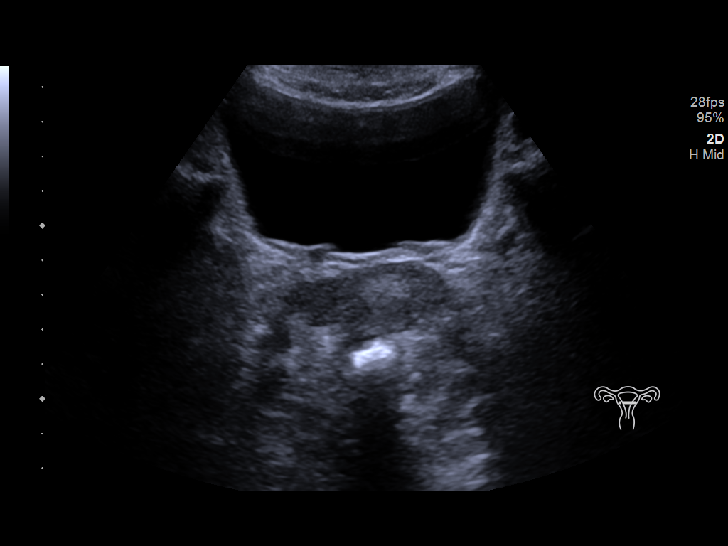
[im 17/98]
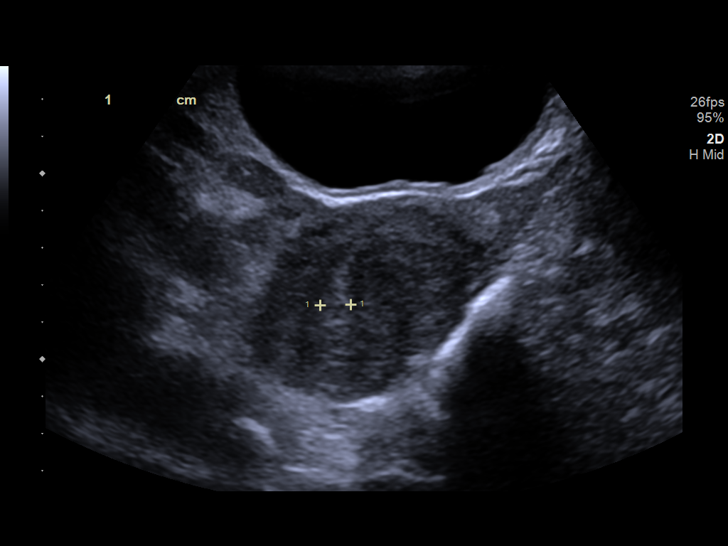
[im 25/98]
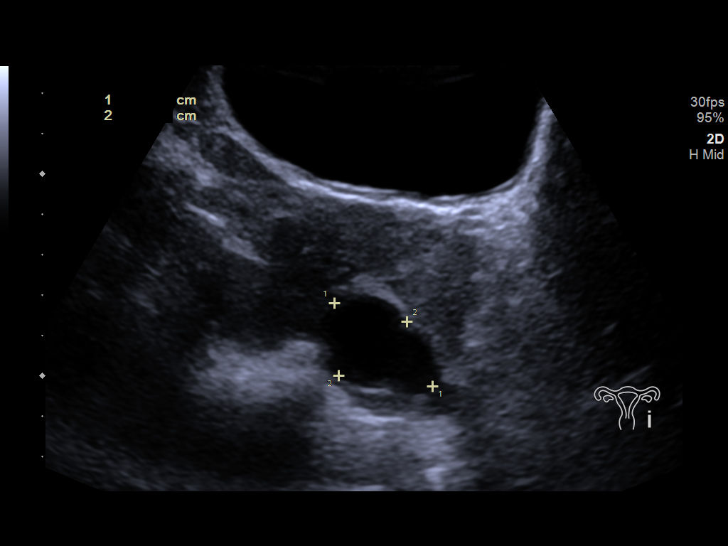
[im 33/98]
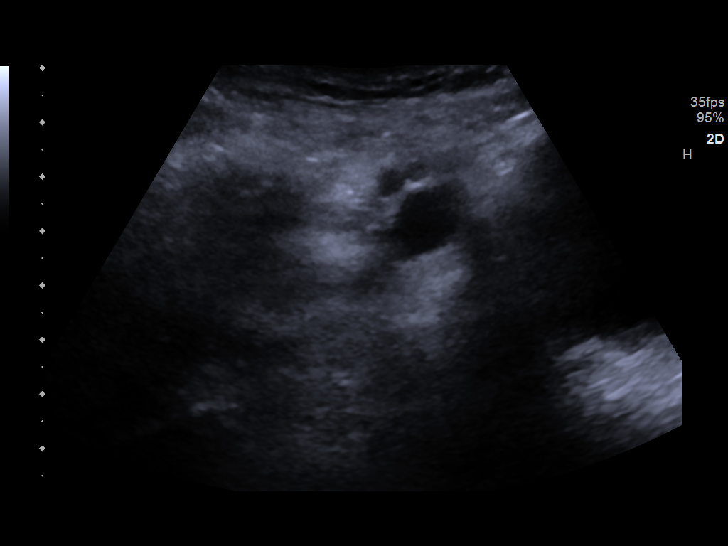
[im 41/98]
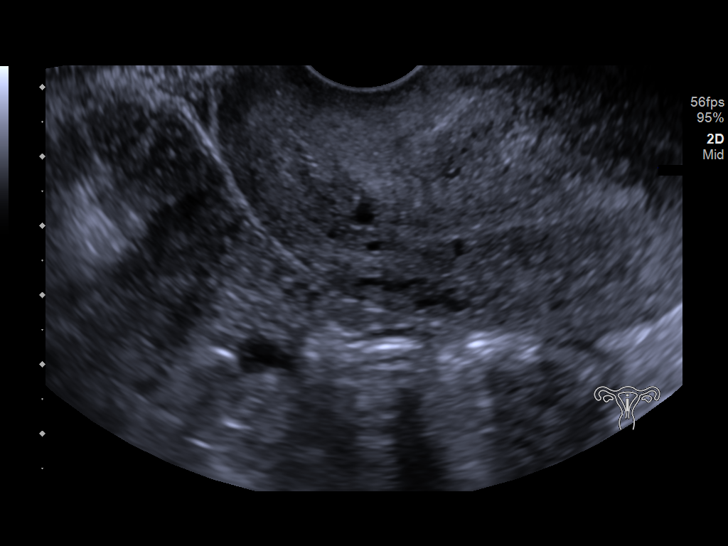
[im 49/98]
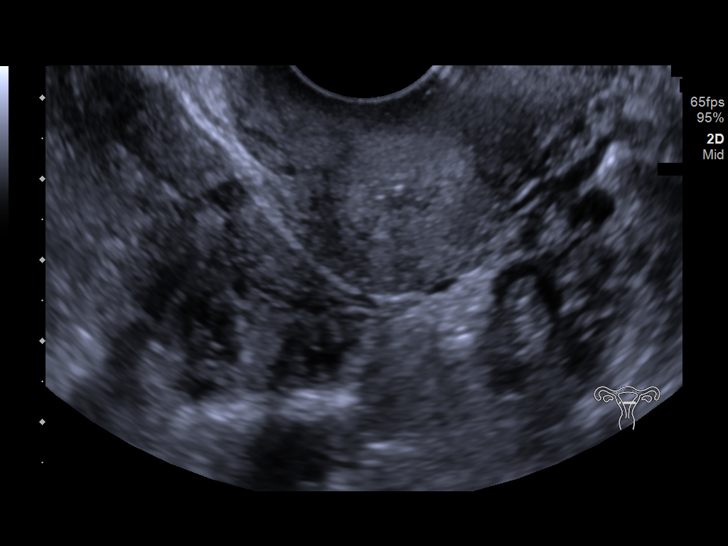
[im 57/98]
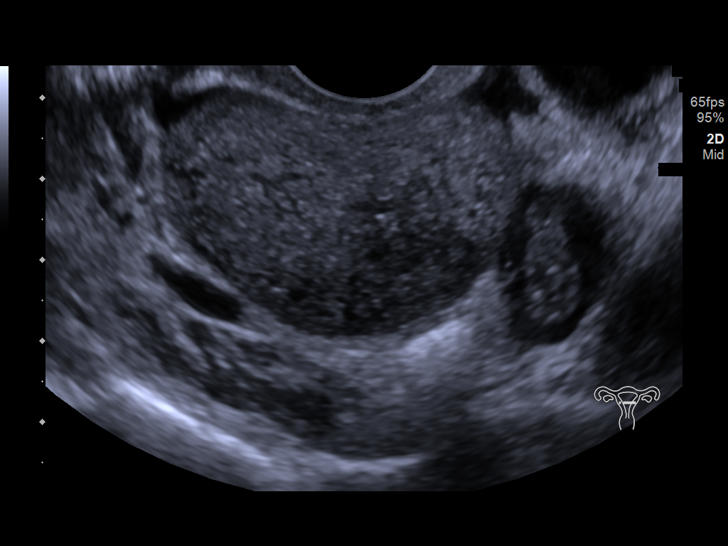
[im 65/98]
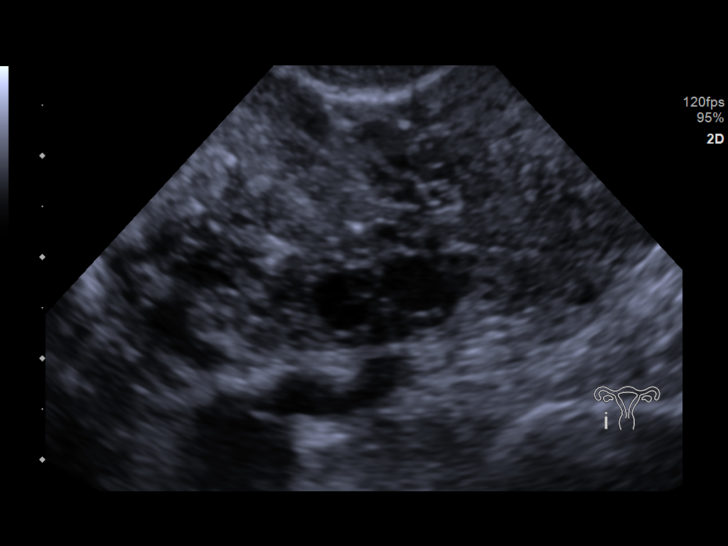
[im 73/98]
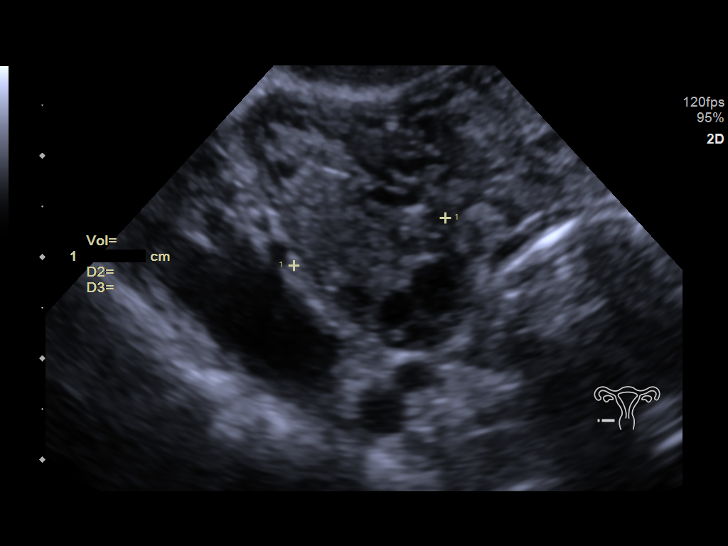
[im 81/98]
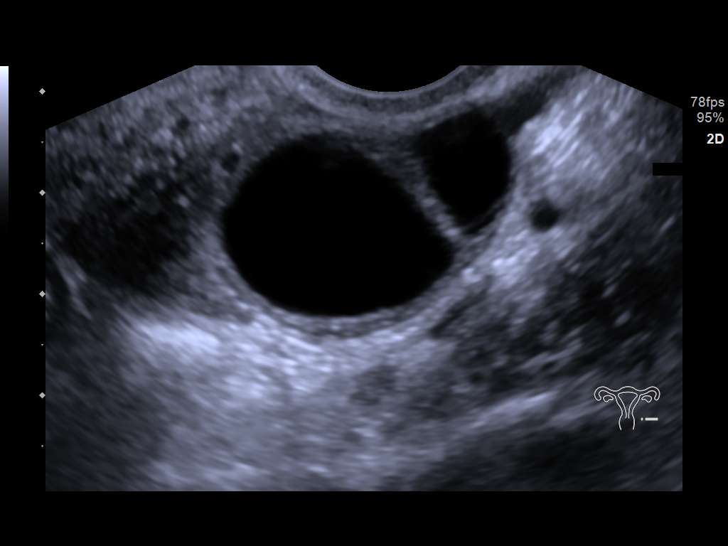
[im 89/98]
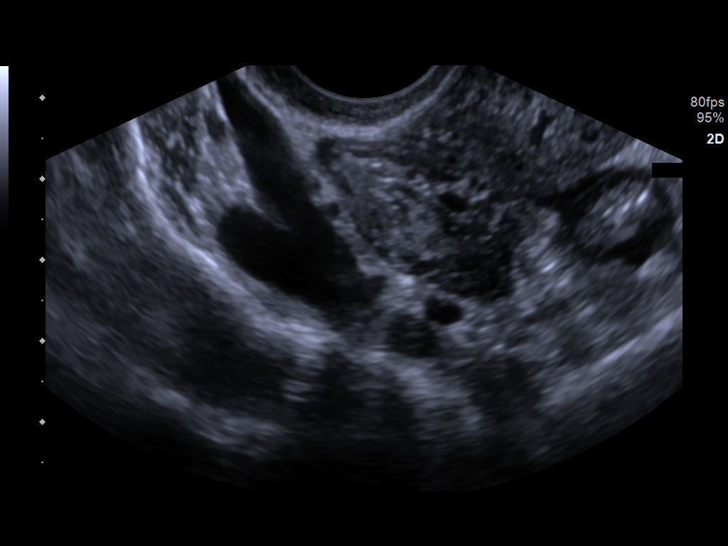
[im 98/98]
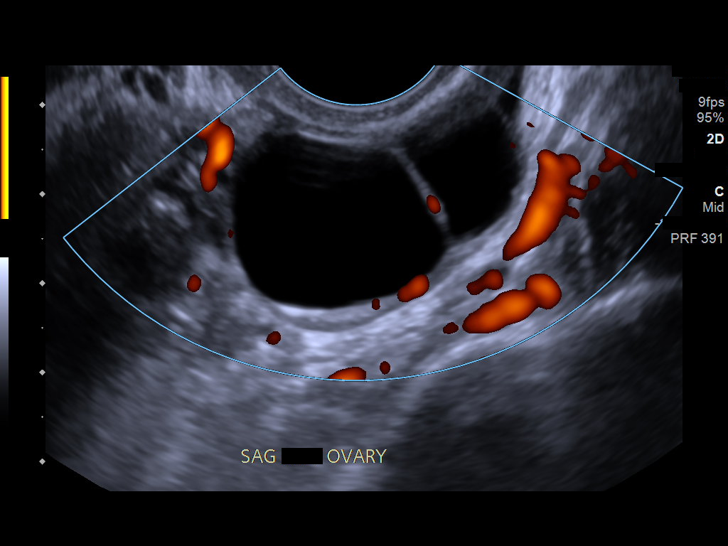

[13 of 25 positions shown; findings below may reference images not displayed]

FINDINGS: Uterus

Measurements: 7.4 x 4.9 x 4.5 cm = volume: 86 mL. No fibroids or
other mass visualized.

Endometrium

Thickness: 6 mm.  No focal abnormality visualized.

Right ovary

Measurements: 3 x 1.4 x 1.6 cm = volume: 3.6 mL. Normal
appearance/no adnexal mass.

Left ovary

Measurements: 4 x 2.3 x 3.2 cm. There are 2 cystic appearing
structures involving the left ovary measuring up to approximately
2.6 and 1.5 cm. These are favored to represent dominant follicles.

Other findings

There is a trace amount of free fluid in the patient's pelvis.
IMPRESSION: 1. No acute abnormality.
2. Simple follicles involving the left ovary as detailed above. No
further follow-up is required.
3. Trace amount of pelvic free fluid, likely physiologic.

## 2020-08-07 ENCOUNTER — Other Ambulatory Visit: Payer: Self-pay

## 2020-08-07 ENCOUNTER — Other Ambulatory Visit: Payer: 59

## 2020-08-07 DIAGNOSIS — Z79899 Other long term (current) drug therapy: Secondary | ICD-10-CM

## 2020-08-07 DIAGNOSIS — Z1211 Encounter for screening for malignant neoplasm of colon: Secondary | ICD-10-CM

## 2020-08-07 DIAGNOSIS — Z5111 Encounter for antineoplastic chemotherapy: Secondary | ICD-10-CM

## 2020-08-07 DIAGNOSIS — C719 Malignant neoplasm of brain, unspecified: Secondary | ICD-10-CM

## 2020-08-08 ENCOUNTER — Telehealth: Payer: Self-pay | Admitting: *Deleted

## 2020-08-08 LAB — COMPLETE METABOLIC PANEL WITH GFR
AG Ratio: 2 (calc) (ref 1.0–2.5)
ALT: 11 U/L (ref 6–29)
AST: 15 U/L (ref 10–35)
Albumin: 4.1 g/dL (ref 3.6–5.1)
Alkaline phosphatase (APISO): 75 U/L (ref 37–153)
BUN: 22 mg/dL (ref 7–25)
CO2: 30 mmol/L (ref 20–32)
Calcium: 9 mg/dL (ref 8.6–10.4)
Chloride: 105 mmol/L (ref 98–110)
Creat: 0.85 mg/dL (ref 0.50–1.05)
GFR, Est African American: 91 mL/min/{1.73_m2} (ref >=60)
GFR, Est Non African American: 79 mL/min/{1.73_m2} (ref >=60)
Globulin: 2.1 g/dL (calc) (ref 1.9–3.7)
Glucose, Bld: 87 mg/dL (ref 65–99)
Potassium: 4.2 mmol/L (ref 3.5–5.3)
Sodium: 141 mmol/L (ref 135–146)
Total Bilirubin: 0.3 mg/dL (ref 0.2–1.2)
Total Protein: 6.2 g/dL (ref 6.1–8.1)

## 2020-08-08 LAB — CBC WITH DIFFERENTIAL/PLATELET
Absolute Monocytes: 454 cells/uL (ref 200–950)
Basophils Absolute: 11 cells/uL (ref 0–200)
Basophils Relative: 0.3 %
Eosinophils Absolute: 40 cells/uL (ref 15–500)
Eosinophils Relative: 1.1 %
HCT: 34.5 % — ABNORMAL LOW (ref 35.0–45.0)
Hemoglobin: 11.8 g/dL (ref 11.7–15.5)
Lymphs Abs: 504 cells/uL — ABNORMAL LOW (ref 850–3900)
MCH: 35.6 pg — ABNORMAL HIGH (ref 27.0–33.0)
MCHC: 34.2 g/dL (ref 32.0–36.0)
MCV: 104.2 fL — ABNORMAL HIGH (ref 80.0–100.0)
MPV: 9.8 fL (ref 7.5–12.5)
Monocytes Relative: 12.6 %
Neutro Abs: 2592 cells/uL (ref 1500–7800)
Neutrophils Relative %: 72 %
Platelets: 94 10*3/uL — ABNORMAL LOW (ref 140–400)
RBC: 3.31 10*6/uL — ABNORMAL LOW (ref 3.80–5.10)
RDW: 12 % (ref 11.0–15.0)
Total Lymphocyte: 14 %
WBC: 3.6 10*3/uL — ABNORMAL LOW (ref 3.8–10.8)

## 2020-08-08 NOTE — Telephone Encounter (Signed)
Lab results from 08/07/2020 faxed to [facility].

## 2020-08-08 NOTE — Progress Notes (Signed)
============================================================ -   Test results slightly outside the reference range are not unusual. If there is anything important, I will review this with you,  otherwise it is considered normal test values.  If you have further questions,  please do not hesitate to contact me at the office or via My Chart.  ============================================================ ============================================================  -  CBC looks "OK", but with MCV increased - is suspicious for   a deficiency of Folate  (Folic Acid ) and /or a Vitamin B12 deficiency,   So recommend that you take a "Super B Complex &   Also a Vitamin B12 supplement  ============================================================   Low Vit B12 may be associated with Anemia , Fatigue,   Peripheral Neuropathy, Dementia, "Brain Fog", & Depression  - Recommend take a sub-lingual form of Vitamin B12 tablet   1,000 to 5,000 mcg tab that you dissolve under your tongue /Daily   - Can get Baron Sane - best price at [employer] or on [employer] ============================================================ ============================================================  -  All Else - Kidneys - Electrolytes - Liver - all  Normal / OK ============================================================ ============================================================

## 2020-08-10 ENCOUNTER — Other Ambulatory Visit: Payer: Self-pay

## 2020-08-10 ENCOUNTER — Other Ambulatory Visit: Payer: 59

## 2020-08-10 ENCOUNTER — Other Ambulatory Visit: Payer: Self-pay | Admitting: Internal Medicine

## 2020-08-10 DIAGNOSIS — Z79899 Other long term (current) drug therapy: Secondary | ICD-10-CM

## 2020-08-10 DIAGNOSIS — T451X5A Adverse effect of antineoplastic and immunosuppressive drugs, initial encounter: Secondary | ICD-10-CM

## 2020-08-10 DIAGNOSIS — D691 Qualitative platelet defects: Secondary | ICD-10-CM

## 2020-08-10 DIAGNOSIS — C719 Malignant neoplasm of brain, unspecified: Secondary | ICD-10-CM

## 2020-08-10 LAB — CBC WITH DIFFERENTIAL/PLATELET
Absolute Monocytes: 290 cells/uL (ref 200–950)
Basophils Absolute: 0 cells/uL (ref 0–200)
Basophils Relative: 0 %
Eosinophils Absolute: 30 cells/uL (ref 15–500)
Eosinophils Relative: 1.2 %
HCT: 34.8 % — ABNORMAL LOW (ref 35.0–45.0)
Hemoglobin: 11.7 g/dL (ref 11.7–15.5)
Lymphs Abs: 403 cells/uL — ABNORMAL LOW (ref 850–3900)
MCH: 34.8 pg — ABNORMAL HIGH (ref 27.0–33.0)
MCHC: 33.6 g/dL (ref 32.0–36.0)
MCV: 103.6 fL — ABNORMAL HIGH (ref 80.0–100.0)
MPV: 10 fL (ref 7.5–12.5)
Monocytes Relative: 11.6 %
Neutro Abs: 1778 cells/uL (ref 1500–7800)
Neutrophils Relative %: 71.1 %
Platelets: 65 10*3/uL — ABNORMAL LOW (ref 140–400)
RBC: 3.36 10*6/uL — ABNORMAL LOW (ref 3.80–5.10)
RDW: 11.8 % (ref 11.0–15.0)
Total Lymphocyte: 16.1 %
WBC: 2.5 10*3/uL — ABNORMAL LOW (ref 3.8–10.8)

## 2020-08-10 NOTE — Progress Notes (Signed)
============================================================ -   Test results slightly outside the reference range are not unusual. If there is anything important, I will review this with you,  otherwise it is considered normal test values.  If you have further questions,  please do not hesitate to contact me at the office or via My Chart.  ============================================================ ============================================================  -  Platelet count slightly decreased -   - Ordered repeat CBC for Monday  May 16

## 2020-08-14 ENCOUNTER — Other Ambulatory Visit: Payer: Self-pay

## 2020-08-14 ENCOUNTER — Other Ambulatory Visit: Payer: 59

## 2020-08-14 DIAGNOSIS — D691 Qualitative platelet defects: Secondary | ICD-10-CM

## 2020-08-14 DIAGNOSIS — T451X5A Adverse effect of antineoplastic and immunosuppressive drugs, initial encounter: Secondary | ICD-10-CM

## 2020-08-14 DIAGNOSIS — Z79899 Other long term (current) drug therapy: Secondary | ICD-10-CM

## 2020-08-14 DIAGNOSIS — C719 Malignant neoplasm of brain, unspecified: Secondary | ICD-10-CM

## 2020-08-14 LAB — CBC WITH DIFFERENTIAL/PLATELET
Absolute Monocytes: 295 cells/uL (ref 200–950)
Basophils Absolute: 9 cells/uL (ref 0–200)
Basophils Relative: 0.3 %
Eosinophils Absolute: 31 cells/uL (ref 15–500)
Eosinophils Relative: 1 %
HCT: 37.3 % (ref 35.0–45.0)
Hemoglobin: 12.6 g/dL (ref 11.7–15.5)
Lymphs Abs: 477 cells/uL — ABNORMAL LOW (ref 850–3900)
MCH: 35.2 pg — ABNORMAL HIGH (ref 27.0–33.0)
MCHC: 33.8 g/dL (ref 32.0–36.0)
MCV: 104.2 fL — ABNORMAL HIGH (ref 80.0–100.0)
MPV: 10.1 fL (ref 7.5–12.5)
Monocytes Relative: 9.5 %
Neutro Abs: 2288 cells/uL (ref 1500–7800)
Neutrophils Relative %: 73.8 %
Platelets: 59 10*3/uL — ABNORMAL LOW (ref 140–400)
RBC: 3.58 10*6/uL — ABNORMAL LOW (ref 3.80–5.10)
RDW: 11.9 % (ref 11.0–15.0)
Total Lymphocyte: 15.4 %
WBC: 3.1 10*3/uL — ABNORMAL LOW (ref 3.8–10.8)

## 2020-08-15 ENCOUNTER — Other Ambulatory Visit: Payer: Self-pay | Admitting: Internal Medicine

## 2020-08-15 DIAGNOSIS — D696 Thrombocytopenia, unspecified: Secondary | ICD-10-CM

## 2020-08-15 DIAGNOSIS — Z79899 Other long term (current) drug therapy: Secondary | ICD-10-CM

## 2020-08-15 DIAGNOSIS — C719 Malignant neoplasm of brain, unspecified: Secondary | ICD-10-CM

## 2020-08-15 NOTE — Progress Notes (Signed)
============================================================ -   Test results slightly outside the reference range are not unusual. If there is anything important, I will review this with you,  otherwise it is considered normal test values.  If you have further questions,  please do not hesitate to contact me at the office or via My Chart.  ============================================================ ============================================================  -  Platelet count still low - continue to monitor  ============================================================ ============================================================

## 2020-08-16 ENCOUNTER — Other Ambulatory Visit: Payer: Self-pay | Admitting: Internal Medicine

## 2020-08-17 ENCOUNTER — Other Ambulatory Visit: Payer: Self-pay

## 2020-08-17 ENCOUNTER — Other Ambulatory Visit: Payer: 59

## 2020-08-17 DIAGNOSIS — C719 Malignant neoplasm of brain, unspecified: Secondary | ICD-10-CM

## 2020-08-17 DIAGNOSIS — D696 Thrombocytopenia, unspecified: Secondary | ICD-10-CM

## 2020-08-17 DIAGNOSIS — Z79899 Other long term (current) drug therapy: Secondary | ICD-10-CM

## 2020-08-17 LAB — CBC WITH DIFFERENTIAL/PLATELET
Absolute Monocytes: 276 cells/uL (ref 200–950)
Basophils Absolute: 9 cells/uL (ref 0–200)
Basophils Relative: 0.3 %
Eosinophils Absolute: 19 cells/uL (ref 15–500)
Eosinophils Relative: 0.6 %
HCT: 36.7 % (ref 35.0–45.0)
Hemoglobin: 12.7 g/dL (ref 11.7–15.5)
Lymphs Abs: 524 cells/uL — ABNORMAL LOW (ref 850–3900)
MCH: 36.4 pg — ABNORMAL HIGH (ref 27.0–33.0)
MCHC: 34.6 g/dL (ref 32.0–36.0)
MCV: 105.2 fL — ABNORMAL HIGH (ref 80.0–100.0)
MPV: 10.2 fL (ref 7.5–12.5)
Monocytes Relative: 8.9 %
Neutro Abs: 2272 cells/uL (ref 1500–7800)
Neutrophils Relative %: 73.3 %
Platelets: 64 10*3/uL — ABNORMAL LOW (ref 140–400)
RBC: 3.49 10*6/uL — ABNORMAL LOW (ref 3.80–5.10)
RDW: 12.1 % (ref 11.0–15.0)
Total Lymphocyte: 16.9 %
WBC: 3.1 10*3/uL — ABNORMAL LOW (ref 3.8–10.8)

## 2020-08-17 NOTE — Progress Notes (Signed)
Platelet count still Low

## 2020-08-18 DIAGNOSIS — D696 Thrombocytopenia, unspecified: Secondary | ICD-10-CM

## 2020-08-21 ENCOUNTER — Other Ambulatory Visit: Payer: Self-pay

## 2020-08-21 ENCOUNTER — Other Ambulatory Visit (INDEPENDENT_AMBULATORY_CARE_PROVIDER_SITE_OTHER): Payer: 59

## 2020-08-21 DIAGNOSIS — Z79899 Other long term (current) drug therapy: Secondary | ICD-10-CM

## 2020-08-21 DIAGNOSIS — Z5111 Encounter for antineoplastic chemotherapy: Secondary | ICD-10-CM

## 2020-08-21 DIAGNOSIS — C719 Malignant neoplasm of brain, unspecified: Secondary | ICD-10-CM

## 2020-08-22 DIAGNOSIS — C719 Malignant neoplasm of brain, unspecified: Secondary | ICD-10-CM

## 2020-08-22 DIAGNOSIS — Z79899 Other long term (current) drug therapy: Secondary | ICD-10-CM | POA: Diagnosis not present

## 2020-08-22 LAB — COMPLETE METABOLIC PANEL WITH GFR
AG Ratio: 2.1 (calc) (ref 1.0–2.5)
ALT: 9 U/L (ref 6–29)
AST: 17 U/L (ref 10–35)
Albumin: 4.4 g/dL (ref 3.6–5.1)
Alkaline phosphatase (APISO): 74 U/L (ref 37–153)
BUN: 17 mg/dL (ref 7–25)
CO2: 27 mmol/L (ref 20–32)
Calcium: 9.3 mg/dL (ref 8.6–10.4)
Chloride: 102 mmol/L (ref 98–110)
Creat: 0.7 mg/dL (ref 0.50–1.05)
GFR, Est African American: 115 mL/min/{1.73_m2} (ref >=60)
GFR, Est Non African American: 100 mL/min/{1.73_m2} (ref >=60)
Globulin: 2.1 g/dL (calc) (ref 1.9–3.7)
Glucose, Bld: 84 mg/dL (ref 65–99)
Potassium: 4.7 mmol/L (ref 3.5–5.3)
Sodium: 136 mmol/L (ref 135–146)
Total Bilirubin: 0.4 mg/dL (ref 0.2–1.2)
Total Protein: 6.5 g/dL (ref 6.1–8.1)

## 2020-08-22 LAB — CBC WITH DIFFERENTIAL/PLATELET
Absolute Monocytes: 208 cells/uL (ref 200–950)
Basophils Absolute: 0 cells/uL (ref 0–200)
Basophils Relative: 0 %
Eosinophils Absolute: 11 cells/uL — ABNORMAL LOW (ref 15–500)
Eosinophils Relative: 0.4 %
HCT: 36 % (ref 35.0–45.0)
Hemoglobin: 12.3 g/dL (ref 11.7–15.5)
Lymphs Abs: 394 cells/uL — ABNORMAL LOW (ref 850–3900)
MCH: 35.8 pg — ABNORMAL HIGH (ref 27.0–33.0)
MCHC: 34.2 g/dL (ref 32.0–36.0)
MCV: 104.7 fL — ABNORMAL HIGH (ref 80.0–100.0)
MPV: 9.8 fL (ref 7.5–12.5)
Monocytes Relative: 7.7 %
Neutro Abs: 2087 cells/uL (ref 1500–7800)
Neutrophils Relative %: 77.3 %
Platelets: 89 10*3/uL — ABNORMAL LOW (ref 140–400)
RBC: 3.44 10*6/uL — ABNORMAL LOW (ref 3.80–5.10)
RDW: 12.1 % (ref 11.0–15.0)
Total Lymphocyte: 14.6 %
WBC: 2.7 10*3/uL — ABNORMAL LOW (ref 3.8–10.8)

## 2020-08-22 NOTE — Progress Notes (Signed)
============================================================ -   Test results slightly outside the reference range are not unusual. If there is anything important, I will review this with you,  otherwise it is considered normal test values.  If you have further questions,  please do not hesitate to contact me at the office or via My Chart.  ============================================================ ============================================================  -  CMET  - > - Kidneys - Electrolytes - Liver -          all  Normal / OK ============================================================ ============================================================  -  CBC  - > Red Cell Ct (Hgb) -- Normal & OK  - Platelet count rebounding - Great  !  - WBC - still Low  ============================================================ ============================================================

## 2020-08-23 ENCOUNTER — Other Ambulatory Visit: Payer: Self-pay | Admitting: Internal Medicine

## 2020-08-23 DIAGNOSIS — Z79899 Other long term (current) drug therapy: Secondary | ICD-10-CM

## 2020-08-23 DIAGNOSIS — C719 Malignant neoplasm of brain, unspecified: Secondary | ICD-10-CM

## 2020-08-23 DIAGNOSIS — G40909 Epilepsy, unspecified, not intractable, without status epilepticus: Secondary | ICD-10-CM

## 2020-08-23 DIAGNOSIS — D696 Thrombocytopenia, unspecified: Secondary | ICD-10-CM

## 2020-09-06 ENCOUNTER — Ambulatory Visit (INDEPENDENT_AMBULATORY_CARE_PROVIDER_SITE_OTHER): Payer: 59 | Admitting: Adult Health

## 2020-09-06 ENCOUNTER — Encounter: Payer: Self-pay | Admitting: Adult Health

## 2020-09-06 ENCOUNTER — Other Ambulatory Visit: Payer: Self-pay

## 2020-09-06 VITALS — BP 128/82 | HR 84 | Temp 97.5°F | Wt 102.0 lb

## 2020-09-06 DIAGNOSIS — B353 Tinea pedis: Secondary | ICD-10-CM | POA: Diagnosis not present

## 2020-09-06 MED ORDER — TERBINAFINE HCL 1 % EX CREA: 1.0000 "application " | TOPICAL_CREAM | Freq: Every day | CUTANEOUS | 0 refills | Status: DC

## 2020-09-06 NOTE — Patient Instructions (Addendum)
Athlete's Foot Athlete's foot (tinea pedis) is a fungal infection of the skin on your feet. It often occurs on the skin that is between or underneath the toes. It can also occur on the soles of your feet. The infection can spread from person to person (is contagious). It can also spread when a person's bare feet come in contact with the fungus on shower floors or on items such as shoes. What are the causes? This condition is caused by a fungus that grows in warm, moist places. You can get athlete's foot by sharing shoes, shower stalls, towels, and wet floors with someone who is infected. Not washing your feet or changing your socks often enough can also lead to athlete's foot. What increases the risk? This condition is more likely to develop in:  Men.  People who have a weak body defense system (immune system).  People who have diabetes.  People who use public showers, such as at a gym.  People who wear heavy-duty shoes, such as Environmental manager.  Seasons with warm, humid weather. What are the signs or symptoms? Symptoms of this condition include:  Itchy areas between your toes or on the soles of your feet.  White, flaky, or scaly areas between your toes or on the soles of your feet.  Very itchy small blisters between your toes or on the soles of your feet.  Small cuts in your skin. These cuts can become infected.  Thick or discolored toenails.   How is this diagnosed? This condition may be diagnosed with a physical exam and a review of your medical history. Your health care provider may also take a skin or toenail sample to examine under a microscope. How is this treated? This condition is treated with antifungal medicines. These may be applied as powders, ointments, or creams. In severe cases, an oral antifungal medicine may be given. Follow these instructions at home: Medicines  Apply or take over-the-counter and prescription medicines only as told by your health  care provider.  Apply your antifungal medicine as told by your health care provider. Do not stop using the antifungal even if your condition improves. Foot care  Do not scratch your feet.  Keep your feet dry: ? Wear cotton or wool socks. Change your socks every day or if they become wet. ? Wear shoes that allow air to flow, such as sandals or canvas tennis shoes.  Wash and dry your feet, including the area between your toes. Also, wash and dry your feet: ? Every day or as told by your health care provider. ? After exercising. General instructions  Do not let others use towels, shoes, nail clippers, or other personal items that touch your feet.  Protect your feet by wearing sandals in wet areas, such as locker rooms and shared showers.  Keep all follow-up visits as told by your health care provider. This is important.  If you have diabetes, keep your blood sugar under control. Contact a health care provider if:  You have a fever.  You have swelling, soreness, warmth, or redness in your foot.  Your feet are not getting better with treatment.  Your symptoms get worse.  You have new symptoms. Summary  Athlete's foot (tinea pedis) is a fungal infection of the skin on your feet. It often occurs on skin that is between or underneath the toes.  This condition is caused by a fungus that grows in warm, moist places.  Symptoms include white, flaky, or scaly areas  between your toes or on the soles of your feet.  This condition is treated with antifungal medicines.  Keep your feet clean. Always dry them thoroughly. This information is not intended to replace advice given to you by your health care provider. Make sure you discuss any questions you have with your health care provider. Document Revised: 11/11/2019 Document Reviewed: 11/11/2019 Elsevier Patient Education  [place].     Terbinafine skin cream, gel, or topical solution What is this medicine? TERBINAFINE  (TER bin a feen) is an antifungal medicine. It is used to treat certain kinds of fungal or yeast infections of the skin. This medicine may be used for other purposes; ask your health care provider or pharmacist if you have questions. COMMON BRAND NAME(S): Desenex Max, Lamisil AT, Lamisil AT Athletes Foot, Lamisil AT [place] What should I tell my health care provider before I take this medicine? They need to know if you have any of these conditions:  an unusual or allergic reaction to terbinafine, other medicines, foods, dyes, or preservatives  pregnant or trying to get pregnant  breast-feeding How should I use this medicine? This medicine is for external use only. Do not take by mouth. Follow the directions on the prescription label. Wash your hands before and after use. If treating hand or nail infections, wash hands before use only. Apply enough product to cover the affected skin or nail and surrounding area. Do not cover the treated area with a bandage or dressing unless your doctor or health care professional tells you to. Do not get this medicine in your eyes. If you do, rinse out with plenty of cool tap water. Use this medicine at regular intervals. Do not use more often than directed. Finish the full course prescribed even if you think you are better. Do not skip doses or stop using this medicine early. Talk to your pediatrician regarding the use of this medicine in children. Special care may be needed. Overdosage: If you think you have taken too much of this medicine contact a poison control center or emergency room at once. NOTE: This medicine is only for you. Do not share this medicine with others. What if I miss a dose? If you miss a dose, apply it as soon as you can. If it is almost time for your next dose, use only that dose. Do not use double or extra doses. What may interact with this medicine? Interactions are not expected. Do not use any other skin products on the affected area  without telling your doctor or health care professional. This list may not describe all possible interactions. Give your health care provider a list of all the medicines, herbs, non-prescription drugs, or dietary supplements you use. Also tell them if you smoke, drink alcohol, or use illegal drugs. Some items may interact with your medicine. What should I watch for while using this medicine? Tell your doctor or health care professional if your symptoms do not improve after 1 week. Some fungal infections can take a long time to be cured. Be sure to finish your full course of treatment. After bathing, make sure to dry your skin completely. Most types of fungus live in moist environments. Wear clean socks and clothing every day. What side effects may I notice from receiving this medicine? Side effects that you should report to your doctor or health care professional as soon as possible:  skin rash, itching  blistering, increased redness, peeling, or swelling of the skin Side effects  that usually do not require medical attention (report to your doctor or health care professional if they continue or are bothersome):  dry skin  minor skin irritation, burning, or stinging This list may not describe all possible side effects. Call your doctor for medical advice about side effects. You may report side effects to FDA at 1-800-FDA-1088. Where should I keep my medicine? Keep out of the reach of children. Store at room temperature between 5 abd 25 degrees C (41 and 77 degrees F). Do not refrigerate. Throw away any unused medicine after the expiration date. NOTE: This sheet is a summary. It may not cover all possible information. If you have questions about this medicine, talk to your doctor, pharmacist, or health care provider.  2021 Elsevier/Gold Standard (2019-04-07 12:47:36)

## 2020-09-06 NOTE — Progress Notes (Signed)
Assessment and Plan:  Areanna was seen today for foot problem.  Diagnoses and all orders for this visit:  Fungal infection right foot Keep foot dry, avoid moist/dark, spray shoes, wear cotton socks apply cream BID for 2 weeks, up to 4 weeks if needed to fully resolve Follow up PRN if not improving, if any new local erythema that is getting worse will try antibiotic  -     terbinafine (LAMISIL) 1 % cream; Apply 1 application topically daily. For 2-4 weeks  Further disposition pending results of labs. Discussed med's effects and SE's.   Over 15 minutes of exam, counseling, chart review, and critical decision making was performed.   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   She notes over the weekend she notes thickening of skin of the dorsum of R foot at base of toes, started becoming very pruritic, red and mild swelling, tried topical hydrocortisone without benefit, tried OTC athlete's foot spray and ice baths with benefit, notes appears much less red/swollen today.   Denies noting any open wound, any injury. Denies fever/chills of pain in joint or with ambulation.   Past Medical History:  Diagnosis Date  . Allergy   . Anemia      Allergies  Allergen Reactions  . Lexapro [Escitalopram Oxalate] Other (See Comments)    "it was too strong for me"  . Wellbutrin [Bupropion] Other (See Comments)    "it was too strong for me"  . Zoloft [Sertraline Hcl] Other (See Comments)    "it was too strong for me"    Current Outpatient Medications on File Prior to Visit  Medication Sig  . Cholecalciferol (VITAMIN D3) 125 MCG  (5000 UT) CAPS Take 10,000 Units by mouth daily.  . clonazePAM (KLONOPIN) 0.5 MG tablet 1 tablet/day till 6/23 and then as needed for big seizure or >3 small seizures in a day  . levETIRAcetam (KEPPRA) 1000 MG tablet Take 2 tablets (2,000 mg total) by mouth 2 (two) times daily. (Patient taking differently: Take 2,000 mg by mouth 2 (two) times daily. Taking 1500mg  in AM, 2000mg  in PM)  . Magnesium 500 MG TABS Take 500 mg by mouth 2 (two) times a week.   . Midazolam (NAYZILAM) 5 MG/0.1ML SOLN Place into the nose.  . ondansetron (ZOFRAN) 8 MG tablet Take by mouth.  . OXcarbazepine (TRILEPTAL) 150 MG tablet Take 150 mg by mouth 2 (two) times daily.  Marland Kitchen temozolomide (TEMODAR) 100 MG capsule Take 3 caps (total 300mg ) nightly by mouth days 1-5.Off days 6-28.  Marland Kitchen VIMPAT 150 MG TABS Take 1 tablet by mouth 2 (two) times daily.  Marland Kitchen acetaminophen (TYLENOL) 325 MG tablet Take 325-650 mg by mouth every 6 (six) hours as needed for mild pain or headache. (Patient not taking: Reported on 09/06/2020)  . amLODipine (NORVASC) 5 MG tablet Take by mouth. (Patient not taking: Reported on 09/06/2020)  . ibuprofen (ADVIL) 200 MG tablet Take 200-400 mg by mouth every 6 (six) hours as needed for headache or mild pain. (Patient not taking: Reported on 09/06/2020)  . Lacosamide 100 MG TABS Take by mouth. (Patient not taking: Reported on 09/06/2020)   No current facility-administered medications on file prior to visit.    ROS: all negative except above.   Physical  Exam:  BP 128/82   Pulse 84   Temp (!) 97.5 F (36.4 C)   Wt 102 lb (46.3 kg)   SpO2 96%   BMI 18.07 kg/m   General Appearance: Well nourished, in no apparent distress. Eyes: PERRLA, conjunctiva no swelling or erythema ENT/Mouth: Mask in place; Hearing normal.  Neck: Supple Respiratory: Respiratory effort normal Cardio: Appears well perfused, Brisk peripheral pulses without edema.  Musculoskeletal: No deformity bil feet, no joint effusion or tenderness, normal  gait.  Skin: Warm, dry; R food with interdigital open sore and oozing milky discharge, no foul odor, crusty and excoriation marks at top of foot with scabbing. No distinct lesion. Fain local pink color. No nail involvement.  Neuro:  Normal muscle tone,  Sensation intact.  Psych: Awake and oriented X 3, normal affect, Insight and Judgment appropriate.     Izora Ribas, NP 3:19 PM [facility]

## 2020-09-07 ENCOUNTER — Ambulatory Visit: Payer: 59 | Admitting: Adult Health

## 2020-09-20 ENCOUNTER — Other Ambulatory Visit: Payer: Self-pay

## 2020-09-20 ENCOUNTER — Other Ambulatory Visit: Payer: 59

## 2020-09-20 ENCOUNTER — Other Ambulatory Visit: Payer: Self-pay | Admitting: Internal Medicine

## 2020-09-20 DIAGNOSIS — C719 Malignant neoplasm of brain, unspecified: Secondary | ICD-10-CM

## 2020-09-20 DIAGNOSIS — D701 Agranulocytosis secondary to cancer chemotherapy: Secondary | ICD-10-CM

## 2020-09-20 DIAGNOSIS — Z5111 Encounter for antineoplastic chemotherapy: Secondary | ICD-10-CM

## 2020-09-20 DIAGNOSIS — T451X5A Adverse effect of antineoplastic and immunosuppressive drugs, initial encounter: Secondary | ICD-10-CM

## 2020-09-20 LAB — CBC WITH DIFFERENTIAL/PLATELET
Absolute Monocytes: 508 cells/uL (ref 200–950)
Basophils Absolute: 21 cells/uL (ref 0–200)
Basophils Relative: 0.6 %
Eosinophils Absolute: 60 cells/uL (ref 15–500)
Eosinophils Relative: 1.7 %
HCT: 35.6 % (ref 35.0–45.0)
Hemoglobin: 11.9 g/dL (ref 11.7–15.5)
Lymphs Abs: 627 cells/uL — ABNORMAL LOW (ref 850–3900)
MCH: 35.2 pg — ABNORMAL HIGH (ref 27.0–33.0)
MCHC: 33.4 g/dL (ref 32.0–36.0)
MCV: 105.3 fL — ABNORMAL HIGH (ref 80.0–100.0)
MPV: 9.1 fL (ref 7.5–12.5)
Monocytes Relative: 14.5 %
Neutro Abs: 2286 cells/uL (ref 1500–7800)
Neutrophils Relative %: 65.3 %
Platelets: 88 10*3/uL — ABNORMAL LOW (ref 140–400)
RBC: 3.38 10*6/uL — ABNORMAL LOW (ref 3.80–5.10)
RDW: 11.9 % (ref 11.0–15.0)
Total Lymphocyte: 17.9 %
WBC: 3.5 10*3/uL — ABNORMAL LOW (ref 3.8–10.8)

## 2020-09-20 LAB — COMPLETE METABOLIC PANEL WITH GFR
AG Ratio: 2 (calc) (ref 1.0–2.5)
ALT: 21 U/L (ref 6–29)
AST: 17 U/L (ref 10–35)
Albumin: 4.1 g/dL (ref 3.6–5.1)
Alkaline phosphatase (APISO): 71 U/L (ref 37–153)
BUN: 24 mg/dL (ref 7–25)
CO2: 28 mmol/L (ref 20–32)
Calcium: 8.9 mg/dL (ref 8.6–10.4)
Chloride: 103 mmol/L (ref 98–110)
Creat: 0.67 mg/dL (ref 0.50–1.05)
GFR, Est African American: 117 mL/min/{1.73_m2} (ref >=60)
GFR, Est Non African American: 101 mL/min/{1.73_m2} (ref >=60)
Globulin: 2.1 g/dL (calc) (ref 1.9–3.7)
Glucose, Bld: 94 mg/dL (ref 65–99)
Potassium: 4.2 mmol/L (ref 3.5–5.3)
Sodium: 137 mmol/L (ref 135–146)
Total Bilirubin: 0.3 mg/dL (ref 0.2–1.2)
Total Protein: 6.2 g/dL (ref 6.1–8.1)

## 2020-09-21 ENCOUNTER — Other Ambulatory Visit: Payer: Self-pay

## 2020-09-21 DIAGNOSIS — T451X5A Adverse effect of antineoplastic and immunosuppressive drugs, initial encounter: Secondary | ICD-10-CM

## 2020-09-21 DIAGNOSIS — Z5111 Encounter for antineoplastic chemotherapy: Secondary | ICD-10-CM

## 2020-09-21 DIAGNOSIS — C719 Malignant neoplasm of brain, unspecified: Secondary | ICD-10-CM

## 2020-09-21 DIAGNOSIS — D701 Agranulocytosis secondary to cancer chemotherapy: Secondary | ICD-10-CM

## 2020-09-21 NOTE — Progress Notes (Signed)
============================================================  -   Test results slightly outside the reference range are not unusual.  If there is anything important, I will review this with you,  otherwise it is considered normal test values.  If you have further questions,  please do not hesitate to contact me at the office or via My Chart.  ============================================================  ============================================================   - CBC shows Red Cell Count / Hgb - OK   - WBC - OK   - Platelet count = 88 K - still Low , but no worse   - Suggest take a "Super B Complex"supplement    ============================================================  - Test results slightly outside the reference range are not unusual.  If there is anything important, I will review this with you,  otherwise it is considered normal test values.  If you have further questions,  please do not hesitate to contact me at the office or via My Chart.  ============================================================  ============================================================   - All Else -  Kidneys - Electrolytes - Liver - all  Normal / OK ============================================================  ============================================================

## 2020-09-25 ENCOUNTER — Other Ambulatory Visit: Payer: Self-pay

## 2020-09-25 ENCOUNTER — Other Ambulatory Visit: Payer: 59

## 2020-09-25 DIAGNOSIS — D701 Agranulocytosis secondary to cancer chemotherapy: Secondary | ICD-10-CM

## 2020-09-25 DIAGNOSIS — Z5111 Encounter for antineoplastic chemotherapy: Secondary | ICD-10-CM

## 2020-09-25 DIAGNOSIS — C719 Malignant neoplasm of brain, unspecified: Secondary | ICD-10-CM

## 2020-09-25 DIAGNOSIS — T451X5A Adverse effect of antineoplastic and immunosuppressive drugs, initial encounter: Secondary | ICD-10-CM

## 2020-09-25 LAB — COMPLETE METABOLIC PANEL WITH GFR
AG Ratio: 2 (calc) (ref 1.0–2.5)
ALT: 21 U/L (ref 6–29)
AST: 21 U/L (ref 10–35)
Albumin: 4.3 g/dL (ref 3.6–5.1)
Alkaline phosphatase (APISO): 78 U/L (ref 37–153)
BUN: 17 mg/dL (ref 7–25)
CO2: 30 mmol/L (ref 20–32)
Calcium: 9.2 mg/dL (ref 8.6–10.4)
Chloride: 103 mmol/L (ref 98–110)
Creat: 0.72 mg/dL (ref 0.50–1.05)
GFR, Est African American: 112 mL/min/{1.73_m2} (ref >=60)
GFR, Est Non African American: 96 mL/min/{1.73_m2} (ref >=60)
Globulin: 2.2 g/dL (calc) (ref 1.9–3.7)
Glucose, Bld: 87 mg/dL (ref 65–99)
Potassium: 4.4 mmol/L (ref 3.5–5.3)
Sodium: 138 mmol/L (ref 135–146)
Total Bilirubin: 0.4 mg/dL (ref 0.2–1.2)
Total Protein: 6.5 g/dL (ref 6.1–8.1)

## 2020-09-25 LAB — CBC WITH DIFFERENTIAL/PLATELET
Absolute Monocytes: 278 cells/uL (ref 200–950)
Basophils Absolute: 9 cells/uL (ref 0–200)
Basophils Relative: 0.3 %
Eosinophils Absolute: 29 cells/uL (ref 15–500)
Eosinophils Relative: 1 %
HCT: 37.3 % (ref 35.0–45.0)
Hemoglobin: 12.8 g/dL (ref 11.7–15.5)
Lymphs Abs: 377 cells/uL — ABNORMAL LOW (ref 850–3900)
MCH: 35.6 pg — ABNORMAL HIGH (ref 27.0–33.0)
MCHC: 34.3 g/dL (ref 32.0–36.0)
MCV: 103.6 fL — ABNORMAL HIGH (ref 80.0–100.0)
MPV: 9.8 fL (ref 7.5–12.5)
Monocytes Relative: 9.6 %
Neutro Abs: 2207 cells/uL (ref 1500–7800)
Neutrophils Relative %: 76.1 %
Platelets: 86 10*3/uL — ABNORMAL LOW (ref 140–400)
RBC: 3.6 10*6/uL — ABNORMAL LOW (ref 3.80–5.10)
RDW: 11.9 % (ref 11.0–15.0)
Total Lymphocyte: 13 %
WBC: 2.9 10*3/uL — ABNORMAL LOW (ref 3.8–10.8)

## 2020-09-26 NOTE — Progress Notes (Signed)
============================================================ -   Test results slightly outside the reference range are not unusual. If there is anything important, I will review this with you,  otherwise it is considered normal test values.  If you have further questions,  please do not hesitate to contact me at the office or via My Chart.  ============================================================ ============================================================  -  Hgb red Cell Count is holding Normal   - WBC  Still a little Low & Stable also - Platelets still low at 86,000, but also holding steady  - Suggest buy Folate or Folic Acid tabs   530-0511 mcg & take 1 tablet Daily - >  - It may help the Platelet count  All Else - Kidneys - Electrolytes - Liver - Magnesium & Thyroid    - all  Normal / OK ============================================================ ============================================================

## 2020-10-02 ENCOUNTER — Other Ambulatory Visit: Payer: 59

## 2020-10-02 ENCOUNTER — Other Ambulatory Visit: Payer: Self-pay

## 2020-10-02 DIAGNOSIS — Z5111 Encounter for antineoplastic chemotherapy: Secondary | ICD-10-CM

## 2020-10-02 DIAGNOSIS — C719 Malignant neoplasm of brain, unspecified: Secondary | ICD-10-CM

## 2020-10-02 DIAGNOSIS — T451X5A Adverse effect of antineoplastic and immunosuppressive drugs, initial encounter: Secondary | ICD-10-CM

## 2020-10-02 LAB — COMPLETE METABOLIC PANEL WITH GFR
AG Ratio: 1.9 (calc) (ref 1.0–2.5)
ALT: 14 U/L (ref 6–29)
AST: 18 U/L (ref 10–35)
Albumin: 4.1 g/dL (ref 3.6–5.1)
Alkaline phosphatase (APISO): 80 U/L (ref 37–153)
BUN: 17 mg/dL (ref 7–25)
CO2: 29 mmol/L (ref 20–32)
Calcium: 8.9 mg/dL (ref 8.6–10.4)
Chloride: 102 mmol/L (ref 98–110)
Creat: 0.67 mg/dL (ref 0.50–1.05)
GFR, Est African American: 117 mL/min/{1.73_m2} (ref >=60)
GFR, Est Non African American: 101 mL/min/{1.73_m2} (ref >=60)
Globulin: 2.2 g/dL (calc) (ref 1.9–3.7)
Glucose, Bld: 83 mg/dL (ref 65–99)
Potassium: 4.1 mmol/L (ref 3.5–5.3)
Sodium: 137 mmol/L (ref 135–146)
Total Bilirubin: 0.4 mg/dL (ref 0.2–1.2)
Total Protein: 6.3 g/dL (ref 6.1–8.1)

## 2020-10-02 LAB — CBC WITH DIFFERENTIAL/PLATELET
Absolute Monocytes: 312 cells/uL (ref 200–950)
Basophils Absolute: 9 cells/uL (ref 0–200)
Basophils Relative: 0.3 %
Eosinophils Absolute: 9 cells/uL — ABNORMAL LOW (ref 15–500)
Eosinophils Relative: 0.3 %
HCT: 36.8 % (ref 35.0–45.0)
Hemoglobin: 12.6 g/dL (ref 11.7–15.5)
Lymphs Abs: 375 cells/uL — ABNORMAL LOW (ref 850–3900)
MCH: 35.7 pg — ABNORMAL HIGH (ref 27.0–33.0)
MCHC: 34.2 g/dL (ref 32.0–36.0)
MCV: 104.2 fL — ABNORMAL HIGH (ref 80.0–100.0)
MPV: 9.7 fL (ref 7.5–12.5)
Monocytes Relative: 10.4 %
Neutro Abs: 2295 cells/uL (ref 1500–7800)
Neutrophils Relative %: 76.5 %
Platelets: 115 10*3/uL — ABNORMAL LOW (ref 140–400)
RBC: 3.53 10*6/uL — ABNORMAL LOW (ref 3.80–5.10)
RDW: 12.1 % (ref 11.0–15.0)
Total Lymphocyte: 12.5 %
WBC: 3 10*3/uL — ABNORMAL LOW (ref 3.8–10.8)

## 2020-11-01 ENCOUNTER — Other Ambulatory Visit: Payer: 59

## 2020-11-01 ENCOUNTER — Other Ambulatory Visit: Payer: Self-pay

## 2020-11-01 ENCOUNTER — Other Ambulatory Visit: Payer: Self-pay | Admitting: Internal Medicine

## 2020-11-01 DIAGNOSIS — T451X5A Adverse effect of antineoplastic and immunosuppressive drugs, initial encounter: Secondary | ICD-10-CM

## 2020-11-01 DIAGNOSIS — Z5111 Encounter for antineoplastic chemotherapy: Secondary | ICD-10-CM

## 2020-11-01 DIAGNOSIS — Z79899 Other long term (current) drug therapy: Secondary | ICD-10-CM

## 2020-11-01 DIAGNOSIS — D701 Agranulocytosis secondary to cancer chemotherapy: Secondary | ICD-10-CM

## 2020-11-01 DIAGNOSIS — C719 Malignant neoplasm of brain, unspecified: Secondary | ICD-10-CM

## 2020-11-01 LAB — CBC WITH DIFFERENTIAL/PLATELET
Absolute Monocytes: 246 cells/uL (ref 200–950)
Basophils Absolute: 11 cells/uL (ref 0–200)
Basophils Relative: 0.4 %
Eosinophils Absolute: 19 cells/uL (ref 15–500)
Eosinophils Relative: 0.7 %
HCT: 38.3 % (ref 35.0–45.0)
Hemoglobin: 13.3 g/dL (ref 11.7–15.5)
Lymphs Abs: 443 cells/uL — ABNORMAL LOW (ref 850–3900)
MCH: 36.4 pg — ABNORMAL HIGH (ref 27.0–33.0)
MCHC: 34.7 g/dL (ref 32.0–36.0)
MCV: 104.9 fL — ABNORMAL HIGH (ref 80.0–100.0)
MPV: 10.2 fL (ref 7.5–12.5)
Monocytes Relative: 9.1 %
Neutro Abs: 1982 cells/uL (ref 1500–7800)
Neutrophils Relative %: 73.4 %
Platelets: 97 10*3/uL — ABNORMAL LOW (ref 140–400)
RBC: 3.65 10*6/uL — ABNORMAL LOW (ref 3.80–5.10)
RDW: 11.8 % (ref 11.0–15.0)
Total Lymphocyte: 16.4 %
WBC: 2.7 10*3/uL — ABNORMAL LOW (ref 3.8–10.8)

## 2020-11-01 LAB — COMPLETE METABOLIC PANEL WITH GFR
AG Ratio: 1.8 (calc) (ref 1.0–2.5)
ALT: 13 U/L (ref 6–29)
AST: 15 U/L (ref 10–35)
Albumin: 4.2 g/dL (ref 3.6–5.1)
Alkaline phosphatase (APISO): 89 U/L (ref 37–153)
BUN: 17 mg/dL (ref 7–25)
CO2: 31 mmol/L (ref 20–32)
Calcium: 9.4 mg/dL (ref 8.6–10.4)
Chloride: 102 mmol/L (ref 98–110)
Creat: 0.75 mg/dL (ref 0.50–1.03)
Globulin: 2.3 g/dL (calc) (ref 1.9–3.7)
Glucose, Bld: 98 mg/dL (ref 65–99)
Potassium: 4.7 mmol/L (ref 3.5–5.3)
Sodium: 138 mmol/L (ref 135–146)
Total Bilirubin: 0.3 mg/dL (ref 0.2–1.2)
Total Protein: 6.5 g/dL (ref 6.1–8.1)
eGFR: 96 mL/min/{1.73_m2} (ref >=60)

## 2020-11-02 NOTE — Progress Notes (Signed)
============================================================ ============================================================  -    CBC - shows Normal Hgb Red cell Ct of 13.4 gm%   WBC = 2,7000 slightly low  &  - Platelet count = 97,000 - low , but still in safe range.   ============================================================ ============================================================  - All Else - glucose - Kidneys - Electrolytes - Liver -  all  Normal / OK ============================================================ ============================================================

## 2020-11-07 ENCOUNTER — Other Ambulatory Visit: Payer: 59

## 2020-11-07 ENCOUNTER — Other Ambulatory Visit: Payer: Self-pay

## 2020-11-07 DIAGNOSIS — Z79899 Other long term (current) drug therapy: Secondary | ICD-10-CM

## 2020-11-07 DIAGNOSIS — T451X5A Adverse effect of antineoplastic and immunosuppressive drugs, initial encounter: Secondary | ICD-10-CM

## 2020-11-07 DIAGNOSIS — C719 Malignant neoplasm of brain, unspecified: Secondary | ICD-10-CM

## 2020-11-07 DIAGNOSIS — D701 Agranulocytosis secondary to cancer chemotherapy: Secondary | ICD-10-CM

## 2020-11-07 DIAGNOSIS — Z5111 Encounter for antineoplastic chemotherapy: Secondary | ICD-10-CM

## 2020-11-07 LAB — COMPLETE METABOLIC PANEL WITH GFR
AG Ratio: 2.1 (calc) (ref 1.0–2.5)
ALT: 13 U/L (ref 6–29)
AST: 16 U/L (ref 10–35)
Albumin: 4.4 g/dL (ref 3.6–5.1)
Alkaline phosphatase (APISO): 89 U/L (ref 37–153)
BUN: 18 mg/dL (ref 7–25)
CO2: 28 mmol/L (ref 20–32)
Calcium: 9.3 mg/dL (ref 8.6–10.4)
Chloride: 106 mmol/L (ref 98–110)
Creat: 0.75 mg/dL (ref 0.50–1.03)
Globulin: 2.1 g/dL (calc) (ref 1.9–3.7)
Glucose, Bld: 85 mg/dL (ref 65–99)
Potassium: 4.4 mmol/L (ref 3.5–5.3)
Sodium: 140 mmol/L (ref 135–146)
Total Bilirubin: 0.4 mg/dL (ref 0.2–1.2)
Total Protein: 6.5 g/dL (ref 6.1–8.1)
eGFR: 96 mL/min/{1.73_m2} (ref >=60)

## 2020-11-07 LAB — CBC WITH DIFFERENTIAL/PLATELET
Absolute Monocytes: 288 cells/uL (ref 200–950)
Basophils Absolute: 11 cells/uL (ref 0–200)
Basophils Relative: 0.4 %
Eosinophils Absolute: 11 cells/uL — ABNORMAL LOW (ref 15–500)
Eosinophils Relative: 0.4 %
HCT: 36.8 % (ref 35.0–45.0)
Hemoglobin: 12.8 g/dL (ref 11.7–15.5)
Lymphs Abs: 409 cells/uL — ABNORMAL LOW (ref 850–3900)
MCH: 36.5 pg — ABNORMAL HIGH (ref 27.0–33.0)
MCHC: 34.8 g/dL (ref 32.0–36.0)
MCV: 104.8 fL — ABNORMAL HIGH (ref 80.0–100.0)
MPV: 10 fL (ref 7.5–12.5)
Monocytes Relative: 10.3 %
Neutro Abs: 2080 cells/uL (ref 1500–7800)
Neutrophils Relative %: 74.3 %
Platelets: 113 10*3/uL — ABNORMAL LOW (ref 140–400)
RBC: 3.51 10*6/uL — ABNORMAL LOW (ref 3.80–5.10)
RDW: 11.8 % (ref 11.0–15.0)
Total Lymphocyte: 14.6 %
WBC: 2.8 10*3/uL — ABNORMAL LOW (ref 3.8–10.8)

## 2020-11-07 NOTE — Progress Notes (Signed)
============================================================ ============================================================  -    CBC =- OK - WBC & WBC holding Steady  &   - Platelet count up from 97,000 to now 113,000 - Great  !

## 2020-11-08 ENCOUNTER — Other Ambulatory Visit: Payer: 59

## 2020-11-14 ENCOUNTER — Other Ambulatory Visit: Payer: 59

## 2020-11-14 ENCOUNTER — Other Ambulatory Visit: Payer: Self-pay

## 2020-11-14 DIAGNOSIS — Z79899 Other long term (current) drug therapy: Secondary | ICD-10-CM

## 2020-11-14 DIAGNOSIS — C719 Malignant neoplasm of brain, unspecified: Secondary | ICD-10-CM

## 2020-11-14 DIAGNOSIS — Z5111 Encounter for antineoplastic chemotherapy: Secondary | ICD-10-CM

## 2020-11-14 DIAGNOSIS — T451X5A Adverse effect of antineoplastic and immunosuppressive drugs, initial encounter: Secondary | ICD-10-CM

## 2020-11-14 LAB — COMPLETE METABOLIC PANEL WITH GFR
AG Ratio: 2 (calc) (ref 1.0–2.5)
ALT: 15 U/L (ref 6–29)
AST: 17 U/L (ref 10–35)
Albumin: 4.3 g/dL (ref 3.6–5.1)
Alkaline phosphatase (APISO): 88 U/L (ref 37–153)
BUN: 18 mg/dL (ref 7–25)
CO2: 30 mmol/L (ref 20–32)
Calcium: 9.4 mg/dL (ref 8.6–10.4)
Chloride: 106 mmol/L (ref 98–110)
Creat: 0.76 mg/dL (ref 0.50–1.03)
Globulin: 2.2 g/dL (calc) (ref 1.9–3.7)
Glucose, Bld: 96 mg/dL (ref 65–99)
Potassium: 4.8 mmol/L (ref 3.5–5.3)
Sodium: 142 mmol/L (ref 135–146)
Total Bilirubin: 0.3 mg/dL (ref 0.2–1.2)
Total Protein: 6.5 g/dL (ref 6.1–8.1)
eGFR: 94 mL/min/{1.73_m2} (ref >=60)

## 2020-11-14 LAB — CBC WITH DIFFERENTIAL/PLATELET
Absolute Monocytes: 394 cells/uL (ref 200–950)
Basophils Absolute: 10 cells/uL (ref 0–200)
Basophils Relative: 0.3 %
Eosinophils Absolute: 20 cells/uL (ref 15–500)
Eosinophils Relative: 0.6 %
HCT: 37.5 % (ref 35.0–45.0)
Hemoglobin: 12.7 g/dL (ref 11.7–15.5)
Lymphs Abs: 517 cells/uL — ABNORMAL LOW (ref 850–3900)
MCH: 35.8 pg — ABNORMAL HIGH (ref 27.0–33.0)
MCHC: 33.9 g/dL (ref 32.0–36.0)
MCV: 105.6 fL — ABNORMAL HIGH (ref 80.0–100.0)
MPV: 10.2 fL (ref 7.5–12.5)
Monocytes Relative: 11.6 %
Neutro Abs: 2458 cells/uL (ref 1500–7800)
Neutrophils Relative %: 72.3 %
Platelets: 119 10*3/uL — ABNORMAL LOW (ref 140–400)
RBC: 3.55 10*6/uL — ABNORMAL LOW (ref 3.80–5.10)
RDW: 11.6 % (ref 11.0–15.0)
Total Lymphocyte: 15.2 %
WBC: 3.4 10*3/uL — ABNORMAL LOW (ref 3.8–10.8)

## 2020-11-15 NOTE — Progress Notes (Signed)
===================================================    CBC - better    -   WBC & Platelet continue to gradually increase  ====================================================  All Else - CBC - Kidneys - Electrolytes - Liver  - all  Normal / OK ====================================================

## 2020-11-28 ENCOUNTER — Other Ambulatory Visit: Payer: 59

## 2020-11-28 ENCOUNTER — Other Ambulatory Visit: Payer: Self-pay

## 2020-11-28 DIAGNOSIS — T451X5A Adverse effect of antineoplastic and immunosuppressive drugs, initial encounter: Secondary | ICD-10-CM

## 2020-11-28 DIAGNOSIS — Z79899 Other long term (current) drug therapy: Secondary | ICD-10-CM

## 2020-11-28 DIAGNOSIS — Z5111 Encounter for antineoplastic chemotherapy: Secondary | ICD-10-CM

## 2020-11-28 DIAGNOSIS — D701 Agranulocytosis secondary to cancer chemotherapy: Secondary | ICD-10-CM

## 2020-11-28 DIAGNOSIS — C719 Malignant neoplasm of brain, unspecified: Secondary | ICD-10-CM

## 2020-11-28 LAB — CBC WITH DIFFERENTIAL/PLATELET
Absolute Monocytes: 333 cells/uL (ref 200–950)
Basophils Absolute: 10 cells/uL (ref 0–200)
Basophils Relative: 0.3 %
Eosinophils Absolute: 30 cells/uL (ref 15–500)
Eosinophils Relative: 0.9 %
HCT: 38.8 % (ref 35.0–45.0)
Hemoglobin: 12.9 g/dL (ref 11.7–15.5)
Lymphs Abs: 584 cells/uL — ABNORMAL LOW (ref 850–3900)
MCH: 34.9 pg — ABNORMAL HIGH (ref 27.0–33.0)
MCHC: 33.2 g/dL (ref 32.0–36.0)
MCV: 104.9 fL — ABNORMAL HIGH (ref 80.0–100.0)
MPV: 10.1 fL (ref 7.5–12.5)
Monocytes Relative: 10.1 %
Neutro Abs: 2343 cells/uL (ref 1500–7800)
Neutrophils Relative %: 71 %
Platelets: 143 10*3/uL (ref 140–400)
RBC: 3.7 10*6/uL — ABNORMAL LOW (ref 3.80–5.10)
RDW: 11.2 % (ref 11.0–15.0)
Total Lymphocyte: 17.7 %
WBC: 3.3 10*3/uL — ABNORMAL LOW (ref 3.8–10.8)

## 2020-11-28 LAB — COMPLETE METABOLIC PANEL WITH GFR
AG Ratio: 2 (calc) (ref 1.0–2.5)
ALT: 16 U/L (ref 6–29)
AST: 17 U/L (ref 10–35)
Albumin: 4.3 g/dL (ref 3.6–5.1)
Alkaline phosphatase (APISO): 74 U/L (ref 37–153)
BUN: 23 mg/dL (ref 7–25)
CO2: 31 mmol/L (ref 20–32)
Calcium: 9.3 mg/dL (ref 8.6–10.4)
Chloride: 105 mmol/L (ref 98–110)
Creat: 0.8 mg/dL (ref 0.50–1.03)
Globulin: 2.1 g/dL (calc) (ref 1.9–3.7)
Glucose, Bld: 88 mg/dL (ref 65–99)
Potassium: 4.9 mmol/L (ref 3.5–5.3)
Sodium: 142 mmol/L (ref 135–146)
Total Bilirubin: 0.4 mg/dL (ref 0.2–1.2)
Total Protein: 6.4 g/dL (ref 6.1–8.1)
eGFR: 89 mL/min/{1.73_m2} (ref >=60)

## 2020-11-29 NOTE — Progress Notes (Deleted)
FOLLOW UP  Assessment and Plan:   Hypertension Well controlled with current medications *** Monitor blood pressure at home; patient to call if consistently greater than 130/80 Continue DASH diet.   Reminder to go to the ER if any CP, SOB, nausea, dizziness, severe HA, changes vision/speech, left arm numbness and tingling and jaw pain.  Cholesterol Currently managing with lifestyle Continue low cholesterol diet and exercise.  Check lipid panel.   BMI 18 *** Continue to recommend diet heavy in fruits and veggies and low in animal meats, cheeses, and dairy products, appropriate calorie intake Discuss exercise recommendations routinely Continue to monitor weight at each visit  Vitamin D Def At goal at last visit; continue supplementation  Defer Vit D level  Anaplastic astrocytoma ([place]) [facility]/onc following  Seizures ([place]) [facility] managing Check keppra levels ***   Continue diet and meds as discussed. Further disposition pending results of labs. Discussed med's effects and SE's.   Over 30 minutes of exam, counseling, chart review, and critical decision making was performed.   Future Appointments  Date Time Provider [place]  11/30/2020  9:30 AM Liane Comber, NP [facility] None  05/28/2021 10:00 AM Unk Pinto, MD [facility] None    ----------------------------------------------------------------------------------------------------------------------  HPI 55 y.o. female  presents for 6 month follow up on hypertension, cholesterol, weight and vitamin D deficiency.   In Feb 2021 patient was hospitalized with seizures, Right sided Paresthesias, clumsiness of the Right hand and receptive/expressive aphasia. 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 She was seen by Dr Tommi Rumps at [facility] and started on cranial  irradiation & chemotherapy. She is treated by vimpat, trileptal and keppra for seizures. ***  BMI is There is no height or weight on file to calculate BMI., she {HAS HAS [id number] been working on diet and exercise. Wt Readings from Last 3 Encounters:  09/06/20 102 lb (46.3 kg)  05/16/20 102 lb 6.4 oz (46.4 kg)  01/03/20 110 lb (49.9 kg)   Her blood pressure {HAS HAS NOT:18834} been controlled at home, today their BP is    She {DOES_DOES [id number] workout. She denies chest pain, shortness of breath, dizziness.   She is not on cholesterol medication. Her cholesterol is not at goal. The cholesterol last visit was:   Lab Results  Component Value Date   CHOL 213 (H) 05/16/2020   HDL 67 05/16/2020   LDLCALC 121 (H) 05/16/2020   TRIG 141 05/16/2020   CHOLHDL 3.2 05/16/2020    She {Has/has not:18111} been working on diet and exercise for glucose management and denies {Symptoms; diabetes w/o none:19199}. Last A1C in the office was:  Lab Results  Component Value Date   HGBA1C 5.2 05/16/2020   Patient is on Vitamin D supplement.   Lab Results  Component Value Date   VD25OH 44 05/16/2020        Current Medications:  Current Outpatient Medications on File Prior to Visit  Medication Sig   acetaminophen (TYLENOL) 325 MG tablet Take 325-650 mg by mouth every 6 (six) hours as needed for mild pain or headache. (Patient not taking: Reported on 09/06/2020)   amLODipine (NORVASC) 5 MG tablet Take by mouth. (Patient not taking: Reported on 09/06/2020)   Cholecalciferol (VITAMIN D3) 125 MCG (5000 UT) CAPS Take 10,000 Units by mouth daily.   clonazePAM (KLONOPIN) 0.5 MG tablet 1 tablet/day till 6/23 and then as  needed for big seizure or >3 small seizures in a day   ibuprofen (ADVIL) 200 MG tablet Take 200-400 mg by mouth every 6 (six) hours as needed for headache or mild pain. (Patient not taking: Reported on 09/06/2020)   Lacosamide 100 MG TABS Take by mouth. (Patient not taking: Reported on 09/06/2020)    levETIRAcetam (KEPPRA) 1000 MG tablet Take 2 tablets (2,000 mg total) by mouth 2 (two) times daily. (Patient taking differently: Take 2,000 mg by mouth 2 (two) times daily. Taking '1500mg'$  in AM, '2000mg'$  in PM)   Magnesium 500 MG TABS Take 500 mg by mouth 2 (two) times a week.    Midazolam (NAYZILAM) 5 MG/0.1ML SOLN Place into the nose.   ondansetron (ZOFRAN) 8 MG tablet Take by mouth.   OXcarbazepine (TRILEPTAL) 150 MG tablet Take 150 mg by mouth 2 (two) times daily.   temozolomide (TEMODAR) 100 MG capsule Take 3 caps (total '300mg'$ ) nightly by mouth days 1-5.Off days 6-28.   terbinafine (LAMISIL) 1 % cream Apply 1 application topically daily. For 2-4 weeks   VIMPAT 150 MG TABS Take 1 tablet by mouth 2 (two) times daily.   No current facility-administered medications on file prior to visit.     Allergies:  Allergies  Allergen Reactions   Lexapro [Escitalopram Oxalate] Other (See Comments)    "it was too strong for me"   Wellbutrin [Bupropion] Other (See Comments)    "it was too strong for me"   Zoloft [Sertraline Hcl] Other (See Comments)    "it was too strong for me"     Medical History:  Past Medical History:  Diagnosis Date   Allergy    Anemia    Family history- Reviewed and unchanged Social history- Reviewed and unchanged  ***  Review of Systems:  Review of Systems  Constitutional:  Negative for malaise/fatigue and weight loss.  HENT:  Negative for hearing loss and tinnitus.   Eyes:  Negative for blurred vision and double vision.  Respiratory:  Negative for cough, shortness of breath and wheezing.   Cardiovascular:  Negative for chest pain, palpitations, orthopnea, claudication and leg swelling.  Gastrointestinal:  Negative for abdominal pain, blood in stool, constipation, diarrhea, heartburn, melena, nausea and vomiting.  Genitourinary: Negative.   Musculoskeletal:  Negative for joint pain and myalgias.  Skin:  Negative for rash.  Neurological:  Negative for  dizziness, tingling, sensory change, weakness and headaches.  Endo/Heme/Allergies:  Negative for polydipsia.  Psychiatric/Behavioral: Negative.    All other systems reviewed and are negative.    Physical Exam: There were no vitals taken for this visit. Wt Readings from Last 3 Encounters:  09/06/20 102 lb (46.3 kg)  05/16/20 102 lb 6.4 oz (46.4 kg)  01/03/20 110 lb (49.9 kg)   General Appearance: Well nourished, in no apparent distress. Eyes: PERRLA, EOMs, conjunctiva no swelling or erythema Sinuses: No Frontal/maxillary tenderness ENT/Mouth: Ext aud canals clear, TMs without erythema, bulging. No erythema, swelling, or exudate on post pharynx.  Tonsils not swollen or erythematous. Hearing normal.  Neck: Supple, thyroid normal.  Respiratory: Respiratory effort normal, BS equal bilaterally without rales, rhonchi, wheezing or stridor.  Cardio: RRR with no MRGs. Brisk peripheral pulses without edema.  Abdomen: Soft, + BS.  Non tender, no guarding, rebound, hernias, masses. Lymphatics: Non tender without lymphadenopathy.  Musculoskeletal: Full ROM, 5/5 strength, {PSY - GAIT AND STATION:22860} gait Skin: Warm, dry without rashes, lesions, ecchymosis.  Neuro: Cranial nerves intact. No cerebellar symptoms.  Psych: Awake and oriented X  3, normal affect, Insight and Judgment appropriate.    Izora Ribas, NP 8:20 AM [facility]

## 2020-11-29 NOTE — Progress Notes (Signed)
============================================================ -   Test results slightly outside the reference range are not unusual. If there is anything important, I will review this with you,  otherwise it is considered normal test values.  If you have further questions,  please do not hesitate to contact me at the office or via My Chart.  ============================================================ ============================================================  -  CBC - OK   - Red cell Count (Hgb) remains Normal   - WBC - Stable - Low at 3,300  - Platelet count up to 143 K  (143,000)   ============================================================ ============================================================  -  All Else - Glucose -Kidneys - Electrolytes - Liver - all  Normal / OK ============================================================ ============================================================

## 2020-11-30 ENCOUNTER — Ambulatory Visit: Payer: 59 | Admitting: Adult Health

## 2020-12-05 ENCOUNTER — Other Ambulatory Visit: Payer: Self-pay

## 2020-12-05 ENCOUNTER — Other Ambulatory Visit: Payer: 59

## 2020-12-06 LAB — CBC WITH DIFFERENTIAL/PLATELET
Absolute Monocytes: 347 cells/uL (ref 200–950)
Basophils Absolute: 10 cells/uL (ref 0–200)
Basophils Relative: 0.3 %
Eosinophils Absolute: 30 cells/uL (ref 15–500)
Eosinophils Relative: 0.9 %
HCT: 37.7 % (ref 35.0–45.0)
Hemoglobin: 12.5 g/dL (ref 11.7–15.5)
Lymphs Abs: 528 cells/uL — ABNORMAL LOW (ref 850–3900)
MCH: 34.7 pg — ABNORMAL HIGH (ref 27.0–33.0)
MCHC: 33.2 g/dL (ref 32.0–36.0)
MCV: 104.7 fL — ABNORMAL HIGH (ref 80.0–100.0)
MPV: 10.2 fL (ref 7.5–12.5)
Monocytes Relative: 10.5 %
Neutro Abs: 2386 cells/uL (ref 1500–7800)
Neutrophils Relative %: 72.3 %
Platelets: 133 10*3/uL — ABNORMAL LOW (ref 140–400)
RBC: 3.6 10*6/uL — ABNORMAL LOW (ref 3.80–5.10)
RDW: 11.1 % (ref 11.0–15.0)
Total Lymphocyte: 16 %
WBC: 3.3 10*3/uL — ABNORMAL LOW (ref 3.8–10.8)

## 2020-12-06 LAB — COMPLETE METABOLIC PANEL WITH GFR
AG Ratio: 1.9 (calc) (ref 1.0–2.5)
ALT: 18 U/L (ref 6–29)
AST: 19 U/L (ref 10–35)
Albumin: 4.2 g/dL (ref 3.6–5.1)
Alkaline phosphatase (APISO): 70 U/L (ref 37–153)
BUN: 23 mg/dL (ref 7–25)
CO2: 27 mmol/L (ref 20–32)
Calcium: 9.3 mg/dL (ref 8.6–10.4)
Chloride: 106 mmol/L (ref 98–110)
Creat: 0.75 mg/dL (ref 0.50–1.03)
Globulin: 2.2 g/dL (calc) (ref 1.9–3.7)
Glucose, Bld: 105 mg/dL — ABNORMAL HIGH (ref 65–99)
Potassium: 4.1 mmol/L (ref 3.5–5.3)
Sodium: 142 mmol/L (ref 135–146)
Total Bilirubin: 0.3 mg/dL (ref 0.2–1.2)
Total Protein: 6.4 g/dL (ref 6.1–8.1)
eGFR: 96 mL/min/{1.73_m2} (ref >=60)

## 2021-04-10 DIAGNOSIS — C719 Malignant neoplasm of brain, unspecified: Secondary | ICD-10-CM | POA: Diagnosis not present

## 2021-04-24 DIAGNOSIS — C719 Malignant neoplasm of brain, unspecified: Secondary | ICD-10-CM | POA: Diagnosis not present

## 2021-05-08 DIAGNOSIS — C719 Malignant neoplasm of brain, unspecified: Secondary | ICD-10-CM | POA: Diagnosis not present

## 2021-05-24 DIAGNOSIS — C712 Malignant neoplasm of temporal lobe: Secondary | ICD-10-CM | POA: Diagnosis not present

## 2021-05-24 DIAGNOSIS — C719 Malignant neoplasm of brain, unspecified: Secondary | ICD-10-CM | POA: Diagnosis not present

## 2021-05-27 ENCOUNTER — Encounter: Payer: Self-pay | Admitting: Internal Medicine

## 2021-05-27 NOTE — Progress Notes (Signed)
Annual Screening/Preventative Visit & Comprehensive Evaluation &  Examination   Future Appointments  Date Time Provider Department  05/28/2021 10:00 AM Unk Pinto, MD [facility]  06/03/2022 10:00 AM Unk Pinto, MD [facility]        This very nice 56 y.o.  MWF  presents for a Screening /Preventative Visit & comprehensive evaluation and management of multiple medical co-morbidities.  Patient has been followed for hx/o labile HTN, HLD, Prediabetes  and Vitamin D Deficiency.        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  She was treated with cranial irradiation at [facility] and &  continues with oral  bimonthly chemotherapy infusions - Mvasi . She also is maintained on seizure meds: Keppra,  OXcarbazepine,  Vimpat, and prn Clonazepam & nasal Midazolam         Patient also is followed expectantly for labile  HTN. Patient's BP has been controlled and patient denies any cardiac symptoms as chest pain, palpitations, shortness of breath, dizziness or ankle swelling. Today's BP is at goal - 130/78.        Patient's hyperlipidemia is not controlled with diet. Last lipids were not at goal :  Lab Results  Component Value Date   CHOL 213 (H) 05/16/2020   HDL 67 05/16/2020   LDLCALC 121 (H) 05/16/2020   TRIG 141 05/16/2020   CHOLHDL 3.2 05/16/2020         Patient has been followed expectantly for glucose intolerance and patient denies reactive hypoglycemic symptoms, visual blurring, diabetic polys or paresthesias. Last A1c was  normal & at goal :  Lab Results  Component Value Date   HGBA1C 5.2 05/16/2020         Finally,  patient has history of Vitamin D Deficiency ("17" /2009) and last Vitamin D was still low  (goal is betw 70-100 ):  Lab Results  Component Value Date   VD25OH 25 05/16/2020     Current Outpatient Medications on File Prior to Visit  Medication Sig   VITAMIN D 5,000 u Take 10,000 Units   daily.   clonazePAM 0.5 MG tablet 1 tablet/day till 6/23 and then as needed for big seizure or >3 small seizures in a day   levETIRAcetam (KEPPRA) 1000 MG tablet Taking 1500mg  in AM, 2000mg  in PM)   Magnesium 500 MG TABS Take 2  times a week.    Midazolam 5 MG/0.1ML SOLN Place into the nose.   ondansetron (ZOFRAN) 8 MG tablet Take as needed for Nausea   OXcarbazepine 150 MG tablet Take 150 mg  2 times daily.   temozolomide  100 MG capsule Take 3 caps nightly  days 1-5.Off days 6-28.   terbinafine (LAMISIL) 1 % cream Apply 1 application topically daily. For 2-4 weeks   VIMPAT 150 MG TABS Take 1 tablet 2  times daily.     Allergies  Allergen Reactions   Lexapro [Escitalopram Oxalate] Other (See Comments)    "it was too strong for me"   Wellbutrin [Bupropion] Other (See Comments)    "it was too strong for me"   Zoloft [Sertraline Hcl] Other (See Comments)    "it was too strong for me"     Past Medical History:  Diagnosis Date   Allergy    Anemia      Health Maintenance  Topic Date Due   Hepatitis C Screening  Never done   TETANUS/TDAP  Never done  Zoster Vaccines- Shingrix (1 of 2) Never done   PAP SMEAR-Modifier  Never done   COVID-19 Vaccine (2 - Moderna risk series) 08/26/2019   MAMMOGRAM  03/09/2020   INFLUENZA VACCINE  Never done   Fecal DNA (Cologuard)  04/07/2021   HIV Screening  Completed   HPV VACCINES  Aged Out     Immunization History  Administered Date(s) Administered   Moderna Sars-Covid-2 Vaccination 07/29/2019   PPD Test 03/18/2018, 04/15/2019, 05/16/2020    Cologard - 04/07/2018 - Negative - Recc 3 year f/u due Jan 2023   Last MGM - 03/09/2018 overdue & patient aware   Past Surgical History:  Procedure Laterality Date   MANDIBLE RECONSTRUCTION  1986    Social History   Tobacco Use   Smoking status: Never   Smokeless tobacco: Never  Vaping Use   Vaping Use: Never used  Substance Use Topics   Alcohol use: Yes    Comment: glass a  wine at night    Drug use: Never      ROS Constitutional: Denies fever, chills, weight loss/gain, headaches, insomnia,  night sweats, and change in appetite. Does c/o fatigue. Eyes: Denies redness, blurred vision, diplopia, discharge, itchy, watery eyes.  ENT: Denies discharge, congestion, post nasal drip, epistaxis, sore throat, earache, hearing loss, dental pain, Tinnitus, Vertigo, Sinus pain, snoring.  Cardio: Denies chest pain, palpitations, irregular heartbeat, syncope, dyspnea, diaphoresis, orthopnea, PND, claudication, edema Respiratory: denies cough, dyspnea, DOE, pleurisy, hoarseness, laryngitis, wheezing.  Gastrointestinal: Denies dysphagia, heartburn, reflux, water brash, pain, cramps, nausea, vomiting, bloating, diarrhea, constipation, hematemesis, melena, hematochezia, jaundice, hemorrhoids Genitourinary: Denies dysuria, frequency, urgency, nocturia, hesitancy, discharge, hematuria, flank pain Breast: Breast lumps, nipple discharge, bleeding.  Musculoskeletal: Denies arthralgia, myalgia, stiffness, Jt. Swelling, pain, limp, and strain/sprain. Denies falls. Skin: Denies puritis, rash, hives, warts, acne, eczema, changing in skin lesion Neuro: No weakness, tremor, incoordination, spasms, paresthesia, pain Psychiatric: Denies confusion, memory loss, sensory loss. Denies Depression. Endocrine: Denies change in weight, skin, hair change, nocturia, and paresthesia, diabetic polys, visual blurring, hyper / hypo glycemic episodes.  Heme/Lymph: No excessive bleeding, bruising, enlarged lymph nodes.  Physical Exam  BP 130/78    Pulse 80    Temp 97.9 F (36.6 C)    Resp 16    Ht 5\' 3"  (1.6 m)    Wt 104 lb (47.2 kg)    SpO2 98%    BMI 18.42 kg/m   General Appearance: Well nourished, well groomed and in no apparent distress.  Eyes: PERRLA, EOMs, conjunctiva no swelling or erythema, normal fundi and vessels. Sinuses: No frontal/maxillary tenderness ENT/Mouth: EACs patent / TMs  nl.  Nares clear without erythema, swelling, mucoid exudates. Oral hygiene is good. No erythema, swelling, or exudate. Tongue normal, non-obstructing. Tonsils not swollen or erythematous. Hearing normal.  Neck: Supple, thyroid not palpable. No bruits, nodes or JVD. Respiratory: Respiratory effort normal.  BS equal and clear bilateral without rales, rhonci, wheezing or stridor. Cardio: Heart sounds are normal with regular rate and rhythm and no murmurs, rubs or gallops. Peripheral pulses are normal and equal bilaterally without edema. No aortic or femoral bruits. Chest: symmetric with normal excursions and percussion. Breasts: Symmetric, without lumps, nipple discharge, retractions, or fibrocystic changes.  Abdomen: Flat, soft with bowel sounds active. Nontender, no guarding, rebound, hernias, masses, or organomegaly.  Lymphatics: Non tender without lymphadenopathy.  Musculoskeletal: Full ROM all peripheral extremities, joint stability, 5/5 strength, and normal gait. Skin: Warm and dry without rashes, lesions, cyanosis, clubbing or  ecchymosis.  Neuro: Cranial nerves intact, reflexes equal bilaterally. Normal muscle tone, no cerebellar symptoms. Sensation intact.  Pysch: Alert and oriented X 3, normal affect, Insight and Judgment appropriate.    Assessment and Plan  1. Annual Preventative Screening Examination   2. Labile hypertension  - EKG 12-Lead - Urinalysis, Routine w reflex microscopic - Microalbumin / creatinine urine ratio - [place], RETROPERITNL ABD,  LTD - CBC with Differential/Platelet - COMPLETE METABOLIC PANEL WITH GFR - Magnesium - TSH  3. Hyperlipidemia, mixed  - EKG 12-Lead - [place], RETROPERITNL ABD,  LTD - Lipid panel - TSH  4. Abnormal glucose  - EKG 12-Lead - [place], RETROPERITNL ABD,  LTD - Hemoglobin A1c - Insulin, random  5. Vitamin D deficiency  - VITAMIN D 25 Hydroxy   6. Anaplastic astrocytoma ([place])  - Managed at [place]  7. Seizure  disorder ([facility])  - Levetiracetam, Immunoassay  8. Screening-pulmonary TB  - TB Skin Test  9. Screening for colorectal cancer  - Cologuard  10. Screening for ischemic heart disease  - EKG 12-Lead  11. FH: hypertension  - EKG 12-Lead  12. Fatigue, unspecified type  - Iron, Total/Total Iron Binding Cap - Vitamin B12 - CBC with Differential/Platelet - TSH  13. Medication management  - Urinalysis, Routine w reflex microscopic - CBC with Differential/Platelet - COMPLETE METABOLIC PANEL WITH GFR - Magnesium - Lipid panel - TSH - Hemoglobin A1c - Insulin, random - VITAMIN D 25 Hydroxy - Levetiracetam, Immunoassay        Patient was counseled in prudent diet to achieve/maintain BMI less than 25 for weight control, BP monitoring, regular exercise and medications. Discussed med's effects and SE's. Screening labs and tests as requested with regular follow-up as recommended. Over 40 minutes of exam, counseling, chart review and high complex critical decision making was performed.   Kirtland Bouchard, MD

## 2021-05-27 NOTE — Patient Instructions (Signed)
Due to recent changes in healthcare laws, you may see the results of your imaging and laboratory studies on MyChart before your provider has had a chance to review them.  We understand that in some cases there may be results that are confusing or concerning to you. Not all laboratory results come back in the same time frame and the provider may be waiting for multiple results in order to interpret others.  Please give [place] 48 hours in order for your provider to thoroughly review all the results before contacting the office for clarification of your results.   ++++++++++++++++++++++++++++++  Vit D  & Vit C 1,000 mg   are recommended to help protect  against the Covid-19 and other Corona viruses.    Also it's recommended  to take  Zinc 50 mg  to help  protect against the Covid-19   and best place to get  is also on [email or web address]  and don't pay more than 6-8 cents /pill !  ================================ Coronavirus (COVID-19) Are you at risk?  Are you at risk for the Coronavirus (COVID-19)?  To be considered HIGH RISK for Coronavirus (COVID-19), you have to meet the following criteria:  Traveled to [place], [place], [place], [place] or [place]; or in the [place] to [place], [place], [place]  or [place]; and have fever, cough, and shortness of breath within the last 2 weeks of travel OR Been in close contact with a person diagnosed with COVID-19 within the last 2 weeks and have  fever, cough,and shortness of breath  IF YOU DO NOT MEET THESE CRITERIA, YOU ARE CONSIDERED LOW RISK FOR COVID-19.  What to do if you are HIGH RISK for COVID-19?  If you are having a medical emergency, call 911. Seek medical care right away. Before you go to a doctors office, urgent care or emergency department,  call ahead and tell them about your recent travel, contact with someone diagnosed with COVID-19   and your symptoms.  You should receive instructions from your physicians office regarding  next steps of care.  When you arrive at healthcare provider, tell the healthcare staff immediately you have returned from  visiting [place], [place], [place], [place] or [place]; or traveled in the [place] to [place], [place],  [place] or [place] in the last two weeks or you have been in close contact with a person diagnosed with  COVID-19 in the last 2 weeks.   Tell the health care staff about your symptoms: fever, cough and shortness of breath. After you have been seen by a medical provider, you will be either: Tested for (COVID-19) and discharged home on quarantine except to seek medical care if  symptoms worsen, and asked to  Stay home and avoid contact with others until you get your results (4-5 days)  Avoid travel on public transportation if possible (such as bus, train, or airplane) or Sent to the Emergency Department by EMS for evaluation, COVID-19 testing  and  possible admission depending on your condition and test results.  What to do if you are LOW RISK for COVID-19?  Reduce your risk of any infection by using the same precautions used for avoiding the common cold or flu:  Wash your hands often with soap and warm water for at least 20 seconds.  If soap and water are not readily available,  use an alcohol-based hand sanitizer with at least 60% alcohol.  If coughing or sneezing, cover your mouth and nose by  or sneezing into the elbow areas of your shirt or coat,  into a tissue or into your sleeve (not your hands). Avoid shaking hands with others and consider head nods or verbal greetings only. Avoid touching your eyes, nose, or mouth with unwashed hands.  Avoid close contact with people who are sick. Avoid places or events with large numbers of people in one location, like concerts or sporting events. Carefully consider travel plans you have or are making. If you are planning any travel outside or inside the [place], visit the CDC's Travelers' Health webpage for  the latest health notices. If you have some symptoms but not all symptoms, continue to monitor at home and seek medical attention  if your symptoms worsen. If you are having a medical emergency, call 911. >>>>>>>>>>>>>>>>>>>>>>> Preventive Care for Adults  A healthy lifestyle and preventive care can promote health and wellness. Preventive health guidelines for women include the following key practices. A routine yearly physical is a good way to check with your health care provider about your health and preventive screening. It is a chance to share any concerns and updates on your health and to receive a thorough exam. Visit your dentist for a routine exam and preventive care every 6 months. Brush your teeth twice a day and floss once a day. Good oral hygiene prevents tooth decay and gum disease. The frequency of eye exams is based on your age, health, family medical history, use of contact lenses, and other factors. Follow your health care provider's recommendations for frequency of eye exams. Eat a healthy diet. Foods like vegetables, fruits, whole grains, low-fat dairy products, and lean protein foods contain the nutrients you need without too many calories. Decrease your intake of foods high in solid fats, added sugars, and salt. Eat the right amount of calories for you. Get information about a proper diet from your health care provider, if necessary. Regular physical exercise is one of the most important things you can do for your health. Most adults should get at least 150 minutes of moderate-intensity exercise (any activity that increases your heart rate and causes you to sweat) each week. In addition, most adults need muscle-strengthening exercises on 2 or more days a week. Maintain a healthy weight. The body mass index (BMI) is a screening tool to identify possible weight problems. It provides an estimate of body fat based on height and weight. Your health care provider can find your BMI and can  help you achieve or maintain a healthy weight. For adults 20 years and older: A BMI below 18.5 is considered underweight. A BMI of 18.5 to 24.9 is normal. A BMI of 25 to 29.9 is considered overweight. A BMI of 30 and above is considered obese. Maintain normal blood lipids and cholesterol levels by exercising and minimizing your intake of saturated fat. Eat a balanced diet with plenty of fruit and vegetables. Blood tests for lipids and cholesterol should begin at age 20 and be repeated every 5 years. If your lipid or cholesterol levels are high, you are over 50, or you are at high risk for heart disease, you may need your cholesterol levels checked more frequently. Ongoing high lipid and cholesterol levels should be treated with medicines if diet and exercise are not working. If you smoke, find out from your health care provider how to quit. If you do not use tobacco, do not start. Lung cancer screening is recommended for adults aged 55-80 years who are at high risk for developing   developing lung cancer because of a history of smoking. A yearly low-dose CT scan of the lungs is recommended for people who have at least a 30-pack-year history of smoking and are a current smoker or have quit within the past 15 years. A pack year of smoking is smoking an average of 1 pack of cigarettes a day for 1 year (for example: 1 pack a day for 30 years or 2 packs a day for 15 years). Yearly screening should continue until the smoker has stopped smoking for at least 15 years. Yearly screening should be stopped for people who develop a health problem that would prevent them from having lung cancer treatment. High blood pressure causes heart disease and increases the risk of stroke. Your blood pressure should be checked at least every 1 to 2 years. Ongoing high blood pressure should be treated with medicines if weight loss and exercise do not work. If you are 67-36 years old, ask your health care provider if you should take aspirin to  prevent strokes. Diabetes screening involves taking a blood sample to check your fasting blood sugar level. This should be done once every 3 years, after age 84, if you are within normal weight and without risk factors for diabetes. Testing should be considered at a younger age or be carried out more frequently if you are overweight and have at least 1 risk factor for diabetes. Breast cancer screening is essential preventive care for women. You should practice "breast self-awareness." This means understanding the normal appearance and feel of your breasts and may include breast self-examination. Any changes detected, no matter how small, should be reported to a health care provider. Women in their 67s and 30s should have a clinical breast exam (CBE) by a health care provider as part of a regular health exam every 1 to 3 years. After age 53, women should have a CBE every year. Starting at age 68, women should consider having a mammogram (breast X-ray test) every year. Women who have a family history of breast cancer should talk to their health care provider about genetic screening. Women at a high risk of breast cancer should talk to their health care providers about having an MRI and a mammogram every year. Breast cancer gene (BRCA)-related cancer risk assessment is recommended for women who have family members with BRCA-related cancers. BRCA-related cancers include breast, ovarian, tubal, and peritoneal cancers. Having family members with these cancers may be associated with an increased risk for harmful changes (mutations) in the breast cancer genes BRCA1 and BRCA2. Results of the assessment will determine the need for genetic counseling and BRCA1 and BRCA2 testing. Routine pelvic exams to screen for cancer are no longer recommended for nonpregnant women who are considered low risk for cancer of the pelvic organs (ovaries, uterus, and vagina) and who do not have symptoms. Ask your health care provider if a  screening pelvic exam is right for you. If you have had past treatment for cervical cancer or a condition that could lead to cancer, you need Pap tests and screening for cancer for at least 20 years after your treatment. If Pap tests have been discontinued, your risk factors (such as having a new sexual partner) need to be reassessed to determine if screening should be resumed. Some women have medical problems that increase the chance of getting cervical cancer. In these cases, your health care provider may recommend more frequent screening and Pap tests. Colorectal cancer can be detected and often prevented. Most routine  colorectal cancer screening begins at the age of 48 years and continues through age 29 years. However, your health care provider may recommend screening at an earlier age if you have risk factors for colon cancer. On a yearly basis, your health care provider may provide home test kits to check for hidden blood in the stool. Use of a small camera at the end of a tube, to directly examine the colon (sigmoidoscopy or colonoscopy), can detect the earliest forms of colorectal cancer. Talk to your health care provider about this at age 12, when routine screening begins.  Direct exam of the colon should be repeated every 5-10 years through age 26 years, unless early forms of pre-cancerous polyps or small growths are found. Hepatitis C blood testing is recommended for all people born from [birth year] through 1965 and any individual with known risks for hepatitis C.  Osteoporosis is a disease in which the bones lose minerals and strength with aging. This can result in serious bone fractures or breaks. The risk of osteoporosis can be identified using a bone density scan. Women ages 30 years and over and women at risk for fractures or osteoporosis should discuss screening with their health care providers. Ask your health care provider whether you should take a calcium supplement or vitamin D to reduce the rate  of osteoporosis. Menopause can be associated with physical symptoms and risks. Hormone replacement therapy is available to decrease symptoms and risks. You should talk to your health care provider about whether hormone replacement therapy is right for you. Use sunscreen. Apply sunscreen liberally and repeatedly throughout the day. You should seek shade when your shadow is shorter than you. Protect yourself by wearing long sleeves, pants, a wide-brimmed hat, and sunglasses year round, whenever you are outdoors. Once a month, do a whole body skin exam, using a mirror to look at the skin on your back. Tell your health care provider of new moles, moles that have irregular borders, moles that are larger than a pencil eraser, or moles that have changed in shape or color. Stay current with required vaccines (immunizations). Influenza vaccine. All adults should be immunized every year. Tetanus, diphtheria, and acellular pertussis (Td, Tdap) vaccine. Pregnant women should receive 1 dose of Tdap vaccine during each pregnancy. The dose should be obtained regardless of the length of time since the last dose. Immunization is preferred during the 27th-36th week of gestation. An adult who has not previously received Tdap or who does not know her vaccine status should receive 1 dose of Tdap. This initial dose should be followed by tetanus and diphtheria toxoids (Td) booster doses every 10 years. Adults with an unknown or incomplete history of completing a 3-dose immunization series with Td-containing vaccines should begin or complete a primary immunization series including a Tdap dose. Adults should receive a Td booster every 10 years. Varicella vaccine. An adult without evidence of immunity to varicella should receive 2 doses or a second dose if she has previously received 1 dose. Pregnant females who do not have evidence of immunity should receive the first dose after pregnancy. This first dose should be obtained before  leaving the health care facility. The second dose should be obtained 4-8 weeks after the first dose. Human papillomavirus (HPV) vaccine. Females aged 13-26 years who have not received the vaccine previously should obtain the 3-dose series. The vaccine is not recommended for use in pregnant females. However, pregnancy testing is not needed before receiving a dose. If a female is found  be pregnant after receiving a dose, no treatment is needed. In that case, the remaining doses should be delayed until after the pregnancy. Immunization is recommended for any person with an immunocompromised condition through the age of 26 years if she did not get any or all doses earlier. During the 3-dose series, the second dose should be obtained 4-8 weeks after the first dose. The third dose should be obtained 24 weeks after the first dose and 16 weeks after the second dose. Zoster vaccine. One dose is recommended for adults aged 60 years or older unless certain conditions are present. Measles, mumps, and rubella (MMR) vaccine. Adults born before 1957 generally are considered immune to measles and mumps. Adults born in 1957 or later should have 1 or more doses of MMR vaccine unless there is a contraindication to the vaccine or there is laboratory evidence of immunity to each of the three diseases. A routine second dose of MMR vaccine should be obtained at least 28 days after the first dose for students attending postsecondary schools, health care workers, or international travelers. People who received inactivated measles vaccine or an unknown type of measles vaccine during 1963-1967 should receive 2 doses of MMR vaccine. People who received inactivated mumps vaccine or an unknown type of mumps vaccine before 1979 and are at high risk for mumps infection should consider immunization with 2 doses of MMR vaccine. For females of childbearing age, rubella immunity should be determined. If there is no evidence of immunity, females  who are not pregnant should be vaccinated. If there is no evidence of immunity, females who are pregnant should delay immunization until after pregnancy. Unvaccinated health care workers born before 1957 who lack laboratory evidence of measles, mumps, or rubella immunity or laboratory confirmation of disease should consider measles and mumps immunization with 2 doses of MMR vaccine or rubella immunization with 1 dose of MMR vaccine. Pneumococcal 13-valent conjugate (PCV13) vaccine. When indicated, a person who is uncertain of her immunization history and has no record of immunization should receive the PCV13 vaccine. An adult aged 19 years or older who has certain medical conditions and has not been previously immunized should receive 1 dose of PCV13 vaccine. This PCV13 should be followed with a dose of pneumococcal polysaccharide (PPSV23) vaccine. The PPSV23 vaccine dose should be obtained at least 1 or more year(s) after the dose of PCV13 vaccine. An adult aged 19 years or older who has certain medical conditions and previously received 1 or more doses of PPSV23 vaccine should receive 1 dose of PCV13. The PCV13 vaccine dose should be obtained 1 or more years after the last PPSV23 vaccine dose.  Pneumococcal polysaccharide (PPSV23) vaccine. When PCV13 is also indicated, PCV13 should be obtained first. All adults aged 65 years and older should be immunized. An adult younger than age 65 years who has certain medical conditions should be immunized. Any person who resides in a nursing home or long-term care facility should be immunized. An adult smoker should be immunized. People with an immunocompromised condition and certain other conditions should receive both PCV13 and PPSV23 vaccines. People with human immunodeficiency virus (HIV) infection should be immunized as soon as possible after diagnosis. Immunization during chemotherapy or radiation therapy should be avoided. Routine use of PPSV23 vaccine is not  recommended for American Indians, [place] Natives, or people younger than 65 years unless there are medical conditions that require PPSV23 vaccine. When indicated, people who have unknown immunization and have no record of immunization should receive   PPSV23 vaccine. One-time revaccination 5 years after the first dose of PPSV23 is recommended for people aged 19-64 years who have chronic kidney failure, nephrotic syndrome, asplenia, or immunocompromised conditions. People who received 1-2 doses of PPSV23 before age 65 years should receive another dose of PPSV23 vaccine at age 65 years or later if at least 5 years have passed since the previous dose. Doses of PPSV23 are not needed for people immunized with PPSV23 at or after age 65 years.  Preventive Services / Frequency  Ages 40 to 64 years Blood pressure check. Lipid and cholesterol check. Lung cancer screening. / Every year if you are aged 55-80 years and have a 30-pack-year history of smoking and currently smoke or have quit within the past 15 years. Yearly screening is stopped once you have quit smoking for at least 15 years or develop a health problem that would prevent you from having lung cancer treatment. Clinical breast exam.** / Every year after age 40 years.  BRCA-related cancer risk assessment.** / For women who have family members with a BRCA-related cancer (breast, ovarian, tubal, or peritoneal cancers). Mammogram.** / Every year beginning at age 40 years and continuing for as long as you are in good health. Consult with your health care provider. Pap test.** / Every 3 years starting at age 30 years through age 65 or 70 years with a history of 3 consecutive normal Pap tests. HPV screening.** / Every 3 years from ages 30 years through ages 65 to 70 years with a history of 3 consecutive normal Pap tests. Fecal occult blood test (FOBT) of stool. / Every year beginning at age 50 years and continuing until age 75 years. You may not need to do this  test if you get a colonoscopy every 10 years. Flexible sigmoidoscopy or colonoscopy.** / Every 5 years for a flexible sigmoidoscopy or every 10 years for a colonoscopy beginning at age 50 years and continuing until age 75 years. Hepatitis C blood test.** / For all people born from 1945 through 1965 and any individual with known risks for hepatitis C. Skin self-exam. / Monthly. Influenza vaccine. / Every year. Tetanus, diphtheria, and acellular pertussis (Tdap/Td) vaccine.** / Consult your health care provider. Pregnant women should receive 1 dose of Tdap vaccine during each pregnancy. 1 dose of Td every 10 years. Varicella vaccine.** / Consult your health care provider. Pregnant females who do not have evidence of immunity should receive the first dose after pregnancy. Zoster vaccine.** / 1 dose for adults aged 60 years or older. Pneumococcal 13-valent conjugate (PCV13) vaccine.** / Consult your health care provider. Pneumococcal polysaccharide (PPSV23) vaccine.** / 1 to 2 doses if you smoke cigarettes or if you have certain conditions. Meningococcal vaccine.** / Consult your health care provider. Hepatitis A vaccine.** / Consult your health care provider. Hepatitis B vaccine.** / Consult your health care provider. Screening for abdominal aortic aneurysm (AAA)  by ultrasound is recommended for people over 50 who have history of high blood pressure or who are current or former smokers. ++++++++++++++++++ Recommend Adult Low Dose Aspirin or  coated  Aspirin 81 mg daily  To reduce risk of Colon Cancer 40 %,  Skin Cancer 26 % ,  Melanoma 46%  and  Pancreatic cancer 60% +++++++++++++++++++ Vitamin D goal  is between 70-100.  Please make sure that you are taking your Vitamin D as directed.  It is very important as a natural anti-inflammatory  helping hair, skin, and nails, as well as reducing stroke and heart   attack risk.  It helps your bones and helps with mood. It also decreases numerous  cancer risks so please take it as directed.  Low Vit D is associated with a 200-300% higher risk for CANCER  and 200-300% higher risk for HEART   ATTACK  &  STROKE.   ...................................... It is also associated with higher death rate at younger ages,  autoimmune diseases like Rheumatoid arthritis, Lupus, Multiple Sclerosis.    Also many other serious conditions, like depression, Alzheimer's Dementia, infertility, muscle aches, fatigue, fibromyalgia - just to name a few. ++++++++++++++++++ Recommend the book "The END of DIETING" by Dr Joel Fuhrman  & the book "The END of DIABETES " by Dr Joel Fuhrman At [employer] - get book & Audio CD's    Being diabetic has a  300% increased risk for heart attack, stroke, cancer, and alzheimer- type vascular dementia. It is very important that you work harder with diet by avoiding all foods that are white. Avoid white rice (brown & wild rice is OK), white potatoes (sweetpotatoes in moderation is OK), White bread or wheat bread or anything made out of white flour like bagels, donuts, rolls, buns, biscuits, cakes, pastries, cookies, pizza crust, and pasta (made from white flour & egg whites) - vegetarian pasta or spinach or wheat pasta is OK. Multigrain breads like Arnold's or [employer], or multigrain sandwich thins or flatbreads.  Diet, exercise and weight loss can reverse and cure diabetes in the early stages.  Diet, exercise and weight loss is very important in the control and prevention of complications of diabetes which affects every system in your body, ie. Brain - dementia/stroke, eyes - glaucoma/blindness, heart - heart attack/heart failure, kidneys - dialysis, stomach - gastric paralysis, intestines - malabsorption, nerves - severe painful neuritis, circulation - gangrene & loss of a leg(s), and finally cancer and Alzheimers.    I recommend avoid fried & greasy foods,  sweets/candy, white rice (brown or wild rice or Quinoa is OK), white  potatoes (sweet potatoes are OK) - anything made from white flour - bagels, doughnuts, rolls, buns, biscuits,white and wheat breads, pizza crust and traditional pasta made of white flour & egg white(vegetarian pasta or spinach or wheat pasta is OK).  Multi-grain bread is OK - like multi-grain flat bread or sandwich thins. Avoid alcohol in excess. Exercise is also important.    Eat all the vegetables you want - avoid meat, especially red meat and dairy - especially cheese.  Cheese is the most concentrated form of trans-fats which is the worst thing to clog up our arteries. Veggie cheese is OK which can be found in the fresh produce section at [employer] or [employer] or [employer]  ++++++++++++++++++++++ DASH Eating Plan  DASH stands for "Dietary Approaches to Stop Hypertension."   The DASH eating plan is a healthy eating plan that has been shown to reduce high blood pressure (hypertension). Additional health benefits may include reducing the risk of type 2 diabetes mellitus, heart disease, and stroke. The DASH eating plan may also help with weight loss. WHAT DO I NEED TO KNOW ABOUT THE DASH EATING PLAN? For the DASH eating plan, you will follow these general guidelines: Choose foods with a percent daily value for sodium of less than 5% (as listed on the food label). Use salt-free seasonings or herbs instead of table salt or sea salt. Check with your health care provider or pharmacist before using salt substitutes. Eat lower-sodium products, often labeled as "lower sodium" or "no   salt added." Eat fresh foods. Eat more vegetables, fruits, and low-fat dairy products. Choose whole grains. Look for the word "whole" as the first word in the ingredient list. Choose fish  Limit sweets, desserts, sugars, and sugary drinks. Choose heart-healthy fats. Eat veggie cheese  Eat more home-cooked food and less restaurant, buffet, and fast food. Limit fried foods. Cook foods using methods other than  frying. Limit canned vegetables. If you do use them, rinse them well to decrease the sodium. When eating at a restaurant, ask that your food be prepared with less salt, or no salt if possible.                      WHAT FOODS CAN I EAT? Read Dr Joel Fuhrman's books on The End of Dieting & The End of Diabetes  Grains Whole grain or whole wheat bread. Brown rice. Whole grain or whole wheat pasta. Quinoa, bulgur, and whole grain cereals. Low-sodium cereals. Corn or whole wheat flour tortillas. Whole grain cornbread. Whole grain crackers. Low-sodium crackers.  Vegetables Fresh or frozen vegetables (raw, steamed, roasted, or grilled). Low-sodium or reduced-sodium tomato and vegetable juices. Low-sodium or reduced-sodium tomato sauce and paste. Low-sodium or reduced-sodium canned vegetables.   Fruits All fresh, canned (in natural juice), or frozen fruits.  Protein Products  All fish and seafood.  Dried beans, peas, or lentils. Unsalted nuts and seeds. Unsalted canned beans.  Dairy Low-fat dairy products, such as skim or 1% milk, 2% or reduced-fat cheeses, low-fat ricotta or cottage cheese, or plain low-fat yogurt. Low-sodium or reduced-sodium cheeses.  Fats and Oils Tub margarines without trans fats. Light or reduced-fat mayonnaise and salad dressings (reduced sodium). Avocado. Safflower, olive, or canola oils. Natural peanut or almond butter.  Other Unsalted popcorn and pretzels. The items listed above may not be a complete list of recommended foods or beverages. Contact your dietitian for more options.  ++++++++++++++++++  WHAT FOODS ARE NOT RECOMMENDED? Grains/ White flour or wheat flour White bread. White pasta. White rice. Refined cornbread. Bagels and croissants. Crackers that contain trans fat.  Vegetables  Creamed or fried vegetables. Vegetables in a . Regular canned vegetables. Regular canned tomato sauce and paste. Regular tomato and vegetable juices.  Fruits Dried fruits.  Canned fruit in light or heavy syrup. Fruit juice.  Meat and Other Protein Products Meat in general - RED meat & White meat.  Fatty cuts of meat. Ribs, chicken wings, all processed meats as bacon, sausage, bologna, salami, fatback, hot dogs, bratwurst and packaged luncheon meats.  Dairy Whole or 2% milk, cream, half-and-half, and cream cheese. Whole-fat or sweetened yogurt. Full-fat cheeses or blue cheese. Non-dairy creamers and whipped toppings. Processed cheese, cheese spreads, or cheese curds.  Condiments Onion and garlic salt, seasoned salt, table salt, and sea salt. Canned and packaged gravies. Worcestershire sauce. Tartar sauce. Barbecue sauce. Teriyaki sauce. Soy sauce, including reduced sodium. Steak sauce. Fish sauce. Oyster sauce. Cocktail sauce. Horseradish. Ketchup and mustard. Meat flavorings and tenderizers. Bouillon cubes. Hot sauce. Tabasco sauce. Marinades. Taco seasonings. Relishes.  Fats and Oils Butter, stick margarine, lard, shortening and bacon fat. Coconut, palm kernel, or palm oils. Regular salad dressings.  Pickles and olives. Salted popcorn and pretzels.  The items listed above may not be a complete list of foods and beverages to avoid.   

## 2021-05-28 ENCOUNTER — Ambulatory Visit (INDEPENDENT_AMBULATORY_CARE_PROVIDER_SITE_OTHER): Payer: 59 | Admitting: Internal Medicine

## 2021-05-28 ENCOUNTER — Encounter: Payer: Self-pay | Admitting: Internal Medicine

## 2021-05-28 ENCOUNTER — Other Ambulatory Visit: Payer: Self-pay

## 2021-05-28 VITALS — BP 130/78 | HR 80 | Temp 97.9°F | Resp 16 | Ht 63.0 in | Wt 104.0 lb

## 2021-05-28 DIAGNOSIS — Z79899 Other long term (current) drug therapy: Secondary | ICD-10-CM

## 2021-05-28 DIAGNOSIS — Z111 Encounter for screening for respiratory tuberculosis: Secondary | ICD-10-CM

## 2021-05-28 DIAGNOSIS — Z136 Encounter for screening for cardiovascular disorders: Secondary | ICD-10-CM

## 2021-05-28 DIAGNOSIS — Z0000 Encounter for general adult medical examination without abnormal findings: Secondary | ICD-10-CM | POA: Diagnosis not present

## 2021-05-28 DIAGNOSIS — Z0001 Encounter for general adult medical examination with abnormal findings: Secondary | ICD-10-CM

## 2021-05-28 DIAGNOSIS — E559 Vitamin D deficiency, unspecified: Secondary | ICD-10-CM | POA: Diagnosis not present

## 2021-05-28 DIAGNOSIS — E782 Mixed hyperlipidemia: Secondary | ICD-10-CM

## 2021-05-28 DIAGNOSIS — R7309 Other abnormal glucose: Secondary | ICD-10-CM | POA: Diagnosis not present

## 2021-05-28 DIAGNOSIS — R0989 Other specified symptoms and signs involving the circulatory and respiratory systems: Secondary | ICD-10-CM

## 2021-05-28 DIAGNOSIS — Z8249 Family history of ischemic heart disease and other diseases of the circulatory system: Secondary | ICD-10-CM

## 2021-05-28 DIAGNOSIS — Z1211 Encounter for screening for malignant neoplasm of colon: Secondary | ICD-10-CM

## 2021-05-28 DIAGNOSIS — R5383 Other fatigue: Secondary | ICD-10-CM

## 2021-05-28 DIAGNOSIS — G40909 Epilepsy, unspecified, not intractable, without status epilepticus: Secondary | ICD-10-CM

## 2021-05-28 DIAGNOSIS — C719 Malignant neoplasm of brain, unspecified: Secondary | ICD-10-CM

## 2021-05-29 LAB — COMPLETE METABOLIC PANEL WITH GFR
AG Ratio: 1.9 (calc) (ref 1.0–2.5)
ALT: 20 U/L (ref 6–29)
AST: 20 U/L (ref 10–35)
Albumin: 4 g/dL (ref 3.6–5.1)
Alkaline phosphatase (APISO): 55 U/L (ref 37–153)
BUN: 18 mg/dL (ref 7–25)
CO2: 29 mmol/L (ref 20–32)
Calcium: 9.3 mg/dL (ref 8.6–10.4)
Chloride: 106 mmol/L (ref 98–110)
Creat: 0.79 mg/dL (ref 0.50–1.03)
Globulin: 2.1 g/dL (calc) (ref 1.9–3.7)
Glucose, Bld: 78 mg/dL (ref 65–99)
Potassium: 4.4 mmol/L (ref 3.5–5.3)
Sodium: 142 mmol/L (ref 135–146)
Total Bilirubin: 0.5 mg/dL (ref 0.2–1.2)
Total Protein: 6.1 g/dL (ref 6.1–8.1)
eGFR: 89 mL/min/{1.73_m2} (ref >=60)

## 2021-05-29 LAB — CBC WITH DIFFERENTIAL/PLATELET
Absolute Monocytes: 403 cells/uL (ref 200–950)
Basophils Absolute: 10 cells/uL (ref 0–200)
Basophils Relative: 0.3 %
Eosinophils Absolute: 61 cells/uL (ref 15–500)
Eosinophils Relative: 1.9 %
HCT: 38.6 % (ref 35.0–45.0)
Hemoglobin: 13.1 g/dL (ref 11.7–15.5)
Lymphs Abs: 301 cells/uL — ABNORMAL LOW (ref 850–3900)
MCH: 34.2 pg — ABNORMAL HIGH (ref 27.0–33.0)
MCHC: 33.9 g/dL (ref 32.0–36.0)
MCV: 100.8 fL — ABNORMAL HIGH (ref 80.0–100.0)
MPV: 9.9 fL (ref 7.5–12.5)
Monocytes Relative: 12.6 %
Neutro Abs: 2426 cells/uL (ref 1500–7800)
Neutrophils Relative %: 75.8 %
Platelets: 140 10*3/uL (ref 140–400)
RBC: 3.83 10*6/uL (ref 3.80–5.10)
RDW: 11.8 % (ref 11.0–15.0)
Total Lymphocyte: 9.4 %
WBC: 3.2 10*3/uL — ABNORMAL LOW (ref 3.8–10.8)

## 2021-05-29 LAB — URINALYSIS, ROUTINE W REFLEX MICROSCOPIC
Bacteria, UA: NONE SEEN /HPF
Bilirubin Urine: NEGATIVE
Glucose, UA: NEGATIVE
Hgb urine dipstick: NEGATIVE
Ketones, ur: NEGATIVE
Leukocytes,Ua: NEGATIVE
Nitrite: NEGATIVE
RBC / HPF: NONE SEEN /HPF (ref 0–2)
Specific Gravity, Urine: 1.017 (ref 1.001–1.035)
WBC, UA: NONE SEEN /HPF (ref 0–5)
pH: 6 (ref 5.0–8.0)

## 2021-05-29 LAB — LIPID PANEL
Cholesterol: 252 mg/dL — ABNORMAL HIGH (ref <200)
HDL: 82 mg/dL (ref >=50)
LDL Cholesterol (Calc): 150 mg/dL (calc) — ABNORMAL HIGH
Non-HDL Cholesterol (Calc): 170 mg/dL (calc) — ABNORMAL HIGH (ref <130)
Total CHOL/HDL Ratio: 3.1 (calc) (ref <5.0)
Triglycerides: 96 mg/dL (ref <150)

## 2021-05-29 LAB — HEMOGLOBIN A1C
Hgb A1c MFr Bld: 5.1 % of total Hgb (ref <5.7)
Mean Plasma Glucose: 100 mg/dL
eAG (mmol/L): 5.5 mmol/L

## 2021-05-29 LAB — MICROALBUMIN / CREATININE URINE RATIO
Creatinine, Urine: 93 mg/dL (ref 20–275)
Microalb Creat Ratio: 1887 mcg/mg creat — ABNORMAL HIGH (ref <30)
Microalb, Ur: 175.5 mg/dL

## 2021-05-29 LAB — TSH: TSH: 2.99 mIU/L

## 2021-05-29 LAB — IRON, TOTAL/TOTAL IRON BINDING CAP
%SAT: 35 % (calc) (ref 16–45)
Iron: 105 ug/dL (ref 45–160)
TIBC: 298 mcg/dL (calc) (ref 250–450)

## 2021-05-29 LAB — MICROSCOPIC MESSAGE

## 2021-05-29 LAB — MAGNESIUM: Magnesium: 1.9 mg/dL (ref 1.5–2.5)

## 2021-05-29 LAB — VITAMIN B12: Vitamin B-12: 327 pg/mL (ref 200–1100)

## 2021-05-29 LAB — INSULIN, RANDOM: Insulin: 3.5 u[IU]/mL

## 2021-05-29 LAB — VITAMIN D 25 HYDROXY (VIT D DEFICIENCY, FRACTURES): Vit D, 25-Hydroxy: 73 ng/mL (ref 30–100)

## 2021-05-29 NOTE — Progress Notes (Signed)
=============================================================== °-   Test results slightly outside the reference range are not unusual. If there is anything important, I will review this with you,  otherwise it is considered normal test values.  If you have further questions,  please do not hesitate to contact me at the office or via My Chart.  =============================================================== ===============================================================  -  CBC is "OK"  Nl red cell count - No Anemia =============================================================== ===============================================================  -  Total  Chol =  252                                                                                           - Elevated             (  Ideal  or  Goal is less than 180  !  )   - and   -  Bad / Dangerous LDL  Chol =   170     - also Elevated              (  Ideal  or  Goal is less than 70  !  )   - Need a much stricter low Cholesterol diet   - Cholesterol only comes from animal sources  - ie. meat, dairy, egg yolks  - Eat all the vegetables you want.  - Avoid meat, especially red meat - Beef AND Pork .  - Avoid cheese & dairy - milk & ice cream.     - Cheese is the most concentrated form of trans-fats which  is the worst thing to clog up our arteries.   - Veggie cheese is OK which can be found in the fresh  produce section at [employer] or [employer] or [employer]  - Recommend repeat Cholesterol  panel at a 3 month office visit =============================================================== ===============================================================  -   -  Vitamin B12 =  327     Very Low  (Ideal or Goal Vit B12 is between 450 - 1,100)   Low Vit B12 may be associated with Anemia , Fatigue,   Peripheral Neuropathy, Dementia, "Brain Fog", & Depression  - Recommend take a sub-lingual form of Vitamin B12 tablet   1,000 to  5,000 mcg tab that you dissolve under your tongue /Daily   - Can get Baron Sane - best price at [employer] or on [employer] =============================================================== ===============================================================  -  A1c - Normal - No Diabetes  - Great ! =============================================================== ===============================================================  -  Vitamin d = 73 - Excellent - Please Keep dose same  =============================================================== ===============================================================  -  Keppra level  still pending  =============================================================== ===============================================================  -  All Else - CBC - Kidneys - Electrolytes - Liver - Magnesium & Thyroid    - all  Normal / OK =============================================================== ===============================================================

## 2021-06-05 DIAGNOSIS — C719 Malignant neoplasm of brain, unspecified: Secondary | ICD-10-CM | POA: Diagnosis not present

## 2021-06-14 ENCOUNTER — Other Ambulatory Visit: Payer: Self-pay | Admitting: Internal Medicine

## 2021-06-14 DIAGNOSIS — G40909 Epilepsy, unspecified, not intractable, without status epilepticus: Secondary | ICD-10-CM

## 2021-06-14 DIAGNOSIS — Z79899 Other long term (current) drug therapy: Secondary | ICD-10-CM

## 2021-06-15 ENCOUNTER — Other Ambulatory Visit: Payer: Self-pay | Admitting: Internal Medicine

## 2021-06-15 ENCOUNTER — Other Ambulatory Visit: Payer: 59

## 2021-06-15 ENCOUNTER — Other Ambulatory Visit: Payer: Self-pay

## 2021-06-15 DIAGNOSIS — Z79899 Other long term (current) drug therapy: Secondary | ICD-10-CM | POA: Diagnosis not present

## 2021-06-15 DIAGNOSIS — G40909 Epilepsy, unspecified, not intractable, without status epilepticus: Secondary | ICD-10-CM | POA: Diagnosis not present

## 2021-06-17 LAB — LEVETIRACETAM, IMMUNOASSAY: LEVETIRACETAM, IMMUNOASSAY: 180 ug/mL — ABNORMAL HIGH (ref 6.0–46.0)

## 2021-06-17 NOTE — Progress Notes (Signed)
<><><><><><><><><><><><><><><><><><><><><><><><><><><><><><><><><> ?<><><><><><><><><><><><><><><><><><><><><><><><><><><><><><><><><> ?-   Test results slightly outside the reference range are not unusual. ?If there is anything important, I will review this with you,  ?otherwise it is considered normal test values.  ?If you have further questions,  ?please do not hesitate to contact me at the office or via My Chart.  ?<><><><><><><><><><><><><><><><><><><><><><><><><><><><><><><><><> ?<><><><><><><><><><><><><><><><><><><><><><><><><><><><><><><><><> ? ?-  Sunday night 7:45 pm ? ?- patient & husband informed of high level of Keppra (180 ) with therapeutic range 6.0-46. ? ?            ( Currently on Keppra 1,000 mg    bid)  ? ? ?- Advised leave off 36 hrs ( Mon & tues am ) & recommended she have a  ?                                                                                          stat Keppra level tues afternoon  ? ?- Patient scheduled to have out-pt chemo at [facility] afternoon, so pt's husband [name]  ? ? will call in morning  to coordinate having a Stat Keppra level done  ?                                                                  with her other labs for her Chemo tues afternoon ? ?- Precautioned of possible withdrawal seizure - but unlikely with her on Fycompa  & Vimpat  ? ? & also she has Klonepin 0.5 mg tabs  and Midazolam  Nasal  as a "back-up"

## 2021-06-19 DIAGNOSIS — C719 Malignant neoplasm of brain, unspecified: Secondary | ICD-10-CM | POA: Diagnosis not present

## 2021-06-25 ENCOUNTER — Other Ambulatory Visit: Payer: Self-pay | Admitting: Internal Medicine

## 2021-06-25 DIAGNOSIS — G40909 Epilepsy, unspecified, not intractable, without status epilepticus: Secondary | ICD-10-CM

## 2021-06-26 ENCOUNTER — Other Ambulatory Visit: Payer: 59

## 2021-06-26 ENCOUNTER — Other Ambulatory Visit: Payer: Self-pay

## 2021-06-26 DIAGNOSIS — G40909 Epilepsy, unspecified, not intractable, without status epilepticus: Secondary | ICD-10-CM | POA: Diagnosis not present

## 2021-06-28 ENCOUNTER — Encounter: Payer: Self-pay | Admitting: Internal Medicine

## 2021-06-28 LAB — LEVETIRACETAM, IMMUNOASSAY: LEVETIRACETAM, IMMUNOASSAY: 52 ug/mL — ABNORMAL HIGH (ref 6.0–46.0)

## 2021-06-28 NOTE — Progress Notes (Signed)
<><><><><><><><><><><><><><><><><><><><><><><><><><><><><><><><><> ?<><><><><><><><><><><><><><><><><><><><><><><><><><><><><><><><><> ?-   Test results slightly outside the reference range are not unusual. ?If there is anything important, I will review this with you,  ?otherwise it is considered normal test values.  ?If you have further questions,  ?please do not hesitate to contact me at the office or via My Chart.  ?<><><><><><><><><><><><><><><><><><><><><><><><><><><><><><><><><> ?<><><><><><><><><><><><><><><><><><><><><><><><><><><><><><><><><> ? ?-  Level is OK - Please stay on dose same & re-check   in 10-14 days  ? ?- bill mck  ? ? ?

## 2021-07-03 DIAGNOSIS — C719 Malignant neoplasm of brain, unspecified: Secondary | ICD-10-CM | POA: Diagnosis not present

## 2021-07-19 DIAGNOSIS — C719 Malignant neoplasm of brain, unspecified: Secondary | ICD-10-CM | POA: Diagnosis not present

## 2021-07-24 ENCOUNTER — Ambulatory Visit: Payer: 59 | Admitting: Internal Medicine

## 2021-07-24 NOTE — Progress Notes (Signed)
RESCHEDULED

## 2021-07-24 NOTE — Progress Notes (Signed)
? ? ?  Future Appointments  ?Date Time Provider Department  ?07/25/2021  3:00 PM Unk Pinto, MD [facility]  ?11/27/2021  9:30 AM Unk Pinto, MD [facility]  ?06/03/2022 10:00 AM Unk Pinto, MD [facility]  ? ? ?History of Present Illness: ? ?                                                This very nice 56 y.o.  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 ?  ?     Patient had a neg Brain MRI on Feb 16 and Repeat done 2 days ago on Apr 17 suspicious for tumor progression in Left Frontal area .  Patient relates she & husband [name] had consultation with Dr Hali Marry from [facility] and plan is to D/C her Avastin bi monthly infusions & resume her Timodar Chemo. Patient reports some difficulty expressing her thoughts.  ? ? ? ?Medications ? ?  lisinopril (ZESTRIL) 5 MG tablet, Take 5 mg by mouth daily. ?  Cholecalciferol (VITAMIN D3) 125 MCG (5000 UT) CAPS, Take 10,000 Units by mouth daily. ?  clonazePAM (KLONOPIN) 0.5 MG tablet, 1 tablet/day till 6/23 and then as needed for big seizure or >3 small seizures in a day ?  FYCOMPA 4 MG TABS, Take 1 tablet by mouth at bedtime. ? ?  levETIRAcetam (KEPPRA) 1000 MG tablet, Take 2 tablets (2,000 mg total)  2 (two) times daily. (Patient taking differently: Taking '1500mg'$  in AM, 2000 mg in PM) ? ?  Midazolam (NAYZILAM) 5 MG/0.1ML SOLN, Place into the nose. ?  ondansetron (ZOFRAN) 8 MG tablet, Take by mouth. ?  VIMPAT 150 MG TABS, Take 1 tablet by mouth 2 (two) times daily. ? ?Problem list ?She  ?  ?Observations/Objective: ? ?BP 130/76   Pulse 85   Temp 97.9 ?F (36.6 ?C)   Resp 16   Ht '5\' 3"'$  (1.6 m)   Wt 98 lb (44.5 kg)   SpO2 98%   BMI 17.36 kg/m?  ? ?HEENT - WNL. ?Neck - supple.  ?Chest - Clear equal BS. ?Cor - Nl HS. RRR w/o  sig MGR. PP 1(+). No edema. ?MS- FROM w/o deformities.  Gait Nl. ?Neuro -  Nl w/o obvious  focal abnormalities. Speech is some what hesitating, but fluent.  ? ? ?Assessment and Plan: ? ? ?1. Seizure disorder ([place]) ? ?- Levetiracetam, Immunoassay ? ?2. Anaplastic astrocytoma ([place]) ? ?- Levetiracetam, Immunoassay ? ?3. Medication management ? ?- Levetiracetam, Immunoassay ? ? ?Follow Up Instructions: ? ?  ?    I discussed the assessment and treatment plan with the patient. The patient was provided an opportunity to ask questions and all were answered. The patient agreed with the plan and demonstrated an understanding of the instructions. ?  ?    The patient was advised to call back or seek an in-person evaluation if the symptoms worsen or if the condition fails to improve as anticipated. ? ? ? ?Kirtland Bouchard, MD ? ?

## 2021-07-25 ENCOUNTER — Ambulatory Visit (INDEPENDENT_AMBULATORY_CARE_PROVIDER_SITE_OTHER): Payer: 59 | Admitting: Internal Medicine

## 2021-07-25 ENCOUNTER — Encounter: Payer: Self-pay | Admitting: Internal Medicine

## 2021-07-25 VITALS — BP 130/76 | HR 85 | Temp 97.9°F | Resp 16 | Ht 63.0 in | Wt 98.0 lb

## 2021-07-25 DIAGNOSIS — Z79899 Other long term (current) drug therapy: Secondary | ICD-10-CM | POA: Diagnosis not present

## 2021-07-25 DIAGNOSIS — G40909 Epilepsy, unspecified, not intractable, without status epilepticus: Secondary | ICD-10-CM | POA: Diagnosis not present

## 2021-07-25 DIAGNOSIS — C719 Malignant neoplasm of brain, unspecified: Secondary | ICD-10-CM

## 2021-07-26 DIAGNOSIS — R569 Unspecified convulsions: Secondary | ICD-10-CM | POA: Diagnosis not present

## 2021-07-28 LAB — LEVETIRACETAM, IMMUNOASSAY: LEVETIRACETAM, IMMUNOASSAY: 45.6 ug/mL (ref 6.0–46.0)

## 2021-07-28 NOTE — Progress Notes (Signed)
<><><><><><><><><><><><><><><><><><><><><><><><><><><><><><><><><> ?<><><><><><><><><><><><><><><><><><><><><><><><><><><><><><><><><> ? ?-    Keppra Level is 45 -  at upper limit of therapeutic range - Great  ? ?<><><><><><><><><><><><><><><><><><><><><><><><><><><><><><><><><> ?<><><><><><><><><><><><><><><><><><><><><><><><><><><><><><><><><> ?

## 2021-07-30 DIAGNOSIS — R569 Unspecified convulsions: Secondary | ICD-10-CM | POA: Diagnosis not present

## 2021-07-31 DIAGNOSIS — R569 Unspecified convulsions: Secondary | ICD-10-CM | POA: Diagnosis not present

## 2021-08-09 DIAGNOSIS — C719 Malignant neoplasm of brain, unspecified: Secondary | ICD-10-CM | POA: Diagnosis not present

## 2021-08-14 DIAGNOSIS — Z01818 Encounter for other preprocedural examination: Secondary | ICD-10-CM | POA: Diagnosis not present

## 2021-08-14 DIAGNOSIS — R4701 Aphasia: Secondary | ICD-10-CM | POA: Diagnosis not present

## 2021-08-14 DIAGNOSIS — G40909 Epilepsy, unspecified, not intractable, without status epilepticus: Secondary | ICD-10-CM | POA: Diagnosis not present

## 2021-08-14 DIAGNOSIS — C712 Malignant neoplasm of temporal lobe: Secondary | ICD-10-CM | POA: Diagnosis not present

## 2021-08-14 DIAGNOSIS — I10 Essential (primary) hypertension: Secondary | ICD-10-CM | POA: Diagnosis not present

## 2021-08-14 DIAGNOSIS — C719 Malignant neoplasm of brain, unspecified: Secondary | ICD-10-CM | POA: Diagnosis not present

## 2021-08-14 DIAGNOSIS — D496 Neoplasm of unspecified behavior of brain: Secondary | ICD-10-CM | POA: Diagnosis not present

## 2021-08-15 DIAGNOSIS — R4701 Aphasia: Secondary | ICD-10-CM | POA: Diagnosis not present

## 2021-08-15 DIAGNOSIS — D496 Neoplasm of unspecified behavior of brain: Secondary | ICD-10-CM | POA: Diagnosis not present

## 2021-08-15 DIAGNOSIS — C711 Malignant neoplasm of frontal lobe: Secondary | ICD-10-CM | POA: Diagnosis not present

## 2021-08-15 DIAGNOSIS — G40909 Epilepsy, unspecified, not intractable, without status epilepticus: Secondary | ICD-10-CM | POA: Diagnosis not present

## 2021-08-15 DIAGNOSIS — I10 Essential (primary) hypertension: Secondary | ICD-10-CM | POA: Diagnosis not present

## 2021-08-16 DIAGNOSIS — Z9889 Other specified postprocedural states: Secondary | ICD-10-CM | POA: Diagnosis not present

## 2021-08-16 DIAGNOSIS — C719 Malignant neoplasm of brain, unspecified: Secondary | ICD-10-CM | POA: Diagnosis not present

## 2021-08-16 DIAGNOSIS — R531 Weakness: Secondary | ICD-10-CM | POA: Diagnosis not present

## 2021-08-22 DIAGNOSIS — C719 Malignant neoplasm of brain, unspecified: Secondary | ICD-10-CM | POA: Diagnosis not present

## 2021-08-22 DIAGNOSIS — C712 Malignant neoplasm of temporal lobe: Secondary | ICD-10-CM | POA: Diagnosis not present

## 2021-08-22 DIAGNOSIS — D496 Neoplasm of unspecified behavior of brain: Secondary | ICD-10-CM | POA: Diagnosis not present

## 2021-08-28 ENCOUNTER — Ambulatory Visit: Payer: 59 | Admitting: Occupational Therapy

## 2021-08-28 ENCOUNTER — Other Ambulatory Visit: Payer: Self-pay

## 2021-08-28 ENCOUNTER — Encounter: Payer: Self-pay | Admitting: Physical Therapy

## 2021-08-28 ENCOUNTER — Ambulatory Visit: Payer: 59 | Attending: Adult Health Nurse Practitioner | Admitting: Physical Therapy

## 2021-08-28 ENCOUNTER — Telehealth: Payer: Self-pay | Admitting: Occupational Therapy

## 2021-08-28 DIAGNOSIS — I69851 Hemiplegia and hemiparesis following other cerebrovascular disease affecting right dominant side: Secondary | ICD-10-CM

## 2021-08-28 DIAGNOSIS — M6281 Muscle weakness (generalized): Secondary | ICD-10-CM | POA: Diagnosis not present

## 2021-08-28 DIAGNOSIS — R2689 Other abnormalities of gait and mobility: Secondary | ICD-10-CM

## 2021-08-28 DIAGNOSIS — R278 Other lack of coordination: Secondary | ICD-10-CM | POA: Insufficient documentation

## 2021-08-28 DIAGNOSIS — R41840 Attention and concentration deficit: Secondary | ICD-10-CM | POA: Diagnosis not present

## 2021-08-28 DIAGNOSIS — R2681 Unsteadiness on feet: Secondary | ICD-10-CM | POA: Insufficient documentation

## 2021-08-28 DIAGNOSIS — C712 Malignant neoplasm of temporal lobe: Secondary | ICD-10-CM | POA: Diagnosis not present

## 2021-08-28 NOTE — Patient Instructions (Signed)
Access Code: [email or web address] URL: [email or web address] Date: 08/28/2021 Prepared by: [facility]  Exercises - Sit to Stand  - 2-3 x daily - 7 x weekly - 1 sets - 5-10 reps - Seated Gluteal Sets  - 2 x daily - 7 x weekly - 1-2 sets - 10 reps - 3 sec hold

## 2021-08-28 NOTE — Telephone Encounter (Signed)
Hi Kristen Foley was evaluated by Physical and Occupational Therapy on 08/28/21.  The patient would benefit from a Speech Therapy evaluation for expressive aphasia as well as potential other cognitive impairments.    If you agree, please place an order in [facility] in [facility] or fax the order to [phone]. Thank you, [name], MS, OTR/L  [place] [address] [place],   [address] Phone:  [phone] Fax:  [phone]

## 2021-08-28 NOTE — Therapy (Signed)
OUTPATIENT PHYSICAL THERAPY NEURO EVALUATION   Patient Name: Kristen Foley MRN: [id number] DOB:[birth year], 56 y.o., female Today's Date: 08/28/2021   PCP: Unk Pinto, MD REFERRING PROVIDER: Holzmacher, Mia [place], NP    PT End of Session - 08/28/21 1847     Visit Number 1    Number of Visits 17    Date for PT Re-Evaluation 10/23/21    Authorization Type [employer]    PT Start Time 0935    PT Stop Time 1018    PT Time Calculation (min) 43 min    Equipment Utilized During Treatment Gait belt    Activity Tolerance Patient tolerated treatment well    Behavior During Therapy Flat affect   tearful            Past Medical History:  Diagnosis Date   Allergy    Anemia    Past Surgical History:  Procedure Laterality Date   [place]   Patient Active Problem List   Diagnosis Date Noted   Anaplastic astrocytoma ([place]) 12/31/2019   Expressive aphasia 11/26/2019   Seizures ([place]) 05/31/2019   Hypoalbuminemia 05/31/2019   Hypomagnesemia    Numbness and tingling of right leg 05/30/2019   Speech disturbance 05/30/2019   Transient alteration of awareness 05/30/2019   Fatigue 03/18/2018   FH: hypertension 03/18/2018   Abnormal glucose 03/18/2018   Hyperlipidemia, mixed 03/18/2018   Elevated BP without diagnosis of hypertension 03/18/2018   Vitamin D deficiency 03/18/2018    ONSET DATE: 08/22/2021   REFERRING DIAG: C71.9 (ICD-10-CM) - Malignant neoplasm of brain, unspecified   THERAPY DIAG:  Muscle weakness (generalized)  Other abnormalities of gait and mobility  Unsteadiness on feet  Rationale for Evaluation and Treatment Rehabilitation  SUBJECTIVE:                                                                                                                                                                                              SUBJECTIVE STATEMENT: Husband provides most of history; new tumor in parietal region. Expressive aphasia since  March, weakness worsening on the right, since early April 2023.To start radiation tomorrow; chemo upcoming; foot drop is worsening in   the past few weeks.  Husband is with her at all times. Pt accompanied by: self and significant other  PERTINENT HISTORY: PMH:  HTN, HLD, seizures, anaplastic astrocytoma (x 2 years)  PAIN:  Are you having pain? No  PRECAUTIONS: Fall and Other: to start again radiation, chemo   FALLS: Has patient fallen in last 6 months? Yes. Number of falls 1-2  LIVING ENVIRONMENT: Lives with: lives with their family  and lives with their spouse Lives in: House/apartment Stairs: Yes: External: 2 steps; can reach both and bilateral but cannot reach both Has following equipment at home:  Has splint for nighttime use  PLOF: Independent; was helping work at [facility] for therapy to stop progression of weakness and help get her stronger, more use of arm and leg.  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  Per 5/23 pathology report:  Sections show a high-grade glial neoplasm with astrocytic nuclear features, occasional multinucleated giant cells, scattered mitotic figures, and palisading and geographic necrosis  COGNITION: Overall cognitive status:  Expressive aphasia (present since March 2023)   MUSCLE TONE: RLE: Hypotonic    POSTURE: No Significant postural limitations   LOWER EXTREMITY MMT:    MMT Right Eval Left Eval  Hip flexion 3-/5 5/5  Hip extension    Hip abduction    Hip adduction    Hip internal rotation    Hip external rotation    Knee flexion  5/5  Knee extension 2/5 5/5  Ankle dorsiflexion 1/5 5/5  Ankle plantarflexion    Ankle inversion    Ankle eversion    (Blank rows = not tested)  TRANSFERS: Assistive device utilized: None  Sit  to stand: CGA Stand to sit: CGA   GAIT: Gait pattern: step through pattern, decreased arm swing- Right, decreased step length- Right, decreased hip/knee flexion- Right, decreased ankle dorsiflexion- Right, and poor foot clearance- Right  Fast speed, decreased safety awareness with gait. Distance walked: 60 ft x 2 Assistive device utilized:  HHA of PT Level of assistance: Min A Comments: 55M :  13.28 sec = 2.47 ft/sec *Gait trial with clinic foot-up brace on RLE-pt demo improved foot clearance with use of foot-up brace.  FUNCTIONAL TESTs:  5 times sit to stand: 16.19 sec Timed up and go (TUG): 14.84 sec   TODAY'S TREATMENT:  Initiated HEP-see below   PATIENT EDUCATION: Education details: PT eval results, POC, benefit of foot-up brace; initiated HEP. Person educated: Patient and Spouse Education method: Explanation, Demonstration, and Handouts Education comprehension: verbalized understanding and returned demonstration   HOME EXERCISE PROGRAM: Access Code: [email or web address] URL: [email or web address] Date: 08/28/2021 Prepared by: [facility]  Exercises - Sit to Stand  - 2-3 x daily - 7 x weekly - 1 sets - 5-10 reps - Seated Gluteal Sets  - 2 x daily - 7 x weekly - 1-2 sets - 10 reps - 3 sec    GOALS: Goals reviewed with patient? Yes  SHORT TERM GOALS: Target date: 09/25/2021  Pt will be supervision with HEP for improved strength, balance, gait. Baseline: Goal status: INITIAL  2.  Pt will improve 5x sit<>stand to less than or equal to 13 sec to demonstrate improved functional strength and transfer efficiency. Baseline: 16.19 sec Goal status: INITIAL  3.  Pt will demo safe use of appropriate orthotic and assistive device for improved safety with functional mobility and gait. Baseline: Trial of foot-up brace in session; family plans to order Goal status: INITIAL   LONG TERM GOALS: Target date: 10/23/2021  Pt will be  supervision with HEP for improved strength, balance, transfers, and gait. Baseline:  Goal status: INITIAL  2.  Pt will improve 5x sit<>stand to less than or equal to 11.5 sec to  demonstrate improved functional strength and transfer efficiency. Baseline:  Goal status: INITIAL  3.  Pt will improve TUG score to less than or equal to 13.5 sec for decreased fall risk. Baseline: 14.84 sec Goal status: INITIAL  4.  Pt will improve gait velocity to at least 2.62 ft/sec for improved gait efficiency and safety. Baseline: 2.47 ft/sec Goal status: INITIAL   ASSESSMENT:  CLINICAL IMPRESSION: Patient is a 56 y.o. female who was seen today for physical therapy evaluation and treatment for Malignant neoplasm of brain, which has resulted in RUE and RLE weakness and expressive aphasia over the past 2-3 months.  Pt was previously independent with gait, mobility, ADLS.  She currently requires min assist/CGA for safety with gait.  She is at fall risk per TUG and FTSTS scores.  She would benefit from skilled PT to address impairments to improve functional mobility, independence, and decreased fall risk.    OBJECTIVE IMPAIRMENTS Abnormal gait, decreased balance, decreased mobility, difficulty walking, decreased strength, decreased safety awareness, impaired flexibility, and impaired tone.   ACTIVITY LIMITATIONS standing, squatting, stairs, transfers, and locomotion level  PARTICIPATION LIMITATIONS: shopping, community activity, and occupation  PERSONAL FACTORS HTN, HLD, seizures, anaplastic astrocytoma are also affecting patient's functional outcome.   REHAB POTENTIAL: Good  CLINICAL DECISION MAKING: Unstable/unpredictable  EVALUATION COMPLEXITY: High  PLAN: PT FREQUENCY: 2x/week  PT DURATION: 8 weeks  PLANNED INTERVENTIONS: Therapeutic exercises, Therapeutic activity, Neuromuscular re-education, Balance training, Gait training, Patient/Family education, Joint mobilization, Orthotic/Fit training,  DME instructions, and Manual therapy  PLAN FOR NEXT SESSION: Review initial HEP; further provide updates to HEP for strengthening.  Gait training with foot-up brace, with appropriate assistive device (cane vs. Walker with ?platform?).  May want to include supine hip and LLE strengthening, standing balance.  Monitor pt's activity tolerance due to starting radiation on 08/29/21.   Jaydrien Wassenaar W., PT 08/28/2021, 7:08 PM

## 2021-08-28 NOTE — Therapy (Unsigned)
OUTPATIENT OCCUPATIONAL THERAPY NEURO EVALUATION  Patient Name: SHAKIERA EDELSON MRN: [id number] DOB:1965/06/10, 56 y.o., female Today's Date: 08/28/2021  PCP: Unk Pinto, MD REFERRING PROVIDER: Holzmacher, Mia [place], NP    OT End of Session - 08/28/21 1446     Visit Number 1    Number of Visits 17    Date for OT Re-Evaluation 10/26/21    Authorization Type [employer]    OT Start Time 1018    OT Stop Time 1103    OT Time Calculation (min) 45 min    Activity Tolerance Patient tolerated treatment well    Behavior During Therapy Flat affect   tearful            Past Medical History:  Diagnosis Date   Allergy    Anemia    Past Surgical History:  Procedure Laterality Date   [place]   Patient Active Problem List   Diagnosis Date Noted   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   Expressive aphasia 11/26/2019   Seizures ([place]) 05/31/2019   Hypoalbuminemia 05/31/2019   Hypomagnesemia    Numbness and tingling of right leg 05/30/2019   Speech disturbance 05/30/2019   Transient alteration of awareness 05/30/2019   Fatigue 03/18/2018   FH: hypertension 03/18/2018   Abnormal glucose 03/18/2018   Hyperlipidemia, mixed 03/18/2018   Elevated BP without diagnosis of hypertension 03/18/2018   Vitamin D deficiency 03/18/2018    ONSET DATE: symptoms beginning about 4-6 weeks ago, referral date 08/23/21  REFERRING DIAG: C71.9 (ICD-10-CM) - Malignant neoplasm of brain, unspecified   THERAPY DIAG:  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  Noticing increased  expressive aphasia, weakness in R arm and leg.  Pt is requiring increased assistance with ambulation over the last 1-2 weeks.  Pt has had a significant loss of function in RUE. Pt accompanied by: significant other  PERTINENT HISTORY: labile HTN, HLD, Prediabetes  and Vitamin D Deficiency who was dx'd with an anaplastic Astrocytoma in Feb 2021 and is followed at [facility] with ongoing bimonthly chemo    PRECAUTIONS: Fall  WEIGHT BEARING RESTRICTIONS No  PAIN:  Are you having pain? No  FALLS: Has patient fallen in last 6 months? Yes. Number of falls 2-3  LIVING ENVIRONMENT: Lives with: lives with their spouse Lives in: House/apartment Stairs: Yes: External: 2 steps; bilateral but cannot reach both Has following equipment at home: shower chair  PLOF: Independent  PATIENT GOALS to be independent  OBJECTIVE:   HAND DOMINANCE: Right  ADLs: Transfers/ambulation related to ADLs: Min assist to hand held assist Eating: using left hand for self-feeding, assist for cutting Grooming: using left hand UB Dressing: mod assist for fasteners and getting R arm in sleeve LB Dressing: mod assist with socks/shoes especially Toileting: min assist Bathing: mod assist with bathing, does use built in shower bench and husband then assists more as she fatigues Tub Shower transfers: walk-in shower, HHA to min assist with stepping in/out of shower Equipment:  built in bench in shower   IADLs: Shopping: husband does the shopping Light housekeeping: still loads dishwasher Meal Prep: husband cooks primarily [employer] mobility: stopped driving about 3 months  ago Medication management: Husband assists with opening containers and filling up pill box.  Pt able to dispense and take pills independently Financial management: still completing some, most set up on autopay Handwriting: unable  MOBILITY STATUS: Needs Assist: Min guard for mobility and Hx of falls  ACTIVITY TOLERANCE: Activity  tolerance: WNL for tasks assessed on eval   UPPER EXTREMITY ROM     Active ROM Right eval Left eval  Shoulder flexion trace WNL  Shoulder abduction    Shoulder adduction    Shoulder extension    Shoulder internal rotation  WNL  Shoulder external rotation  WNL  Elbow flexion  WNL  Elbow extension  WNL  Wrist flexion    Wrist extension    Wrist ulnar deviation    Wrist radial deviation    Wrist pronation    Wrist supination    (Blank rows = not tested)  Passive ROM Right eval Left eval  Shoulder flexion 140   Shoulder abduction    Shoulder adduction    Shoulder extension    Shoulder internal rotation    Shoulder external rotation    Elbow flexion WNL   Elbow extension WNL   Wrist flexion    Wrist extension    Wrist ulnar deviation    Wrist radial deviation    Wrist pronation    Wrist supination    (Blank rows = not tested)  UPPER EXTREMITY MMT:     MMT Right eval Left eval  Shoulder flexion trace   Shoulder abduction    Shoulder adduction    Shoulder extension    Shoulder internal rotation    Shoulder external rotation    Middle trapezius    Lower trapezius    Elbow flexion    Elbow extension    Wrist flexion    Wrist extension    Wrist ulnar deviation    Wrist radial deviation    Wrist pronation    Wrist supination    (Blank rows = not tested)  HAND FUNCTION: Grip strength: Right: loose gross grasp when squeezing therapist's hand, unable to get a reading on dynamometer; Left: 48 lbs  COORDINATION: Unable to assess due to no active movement in dominant RUE  SENSATION: Light touch: Impaired with ability to identify sensation to light touch, but with decreased accuracy Proprioception: Impaired, decreased awareness of RUE falling off of lap   COGNITION: Overall cognitive status:  difficult to assess and husband providing majority of the information as pt with expressive aphasia.  Pt did require gestures, hand over hand, and demonstration to  follow commands intermittently.  VISION: Subjective report: no new visual changes Baseline vision: Wears glasses for reading only   PRAXIS: Impaired: Initiation, Ideomotor, Motor planning, and Perseveration     PATIENT EDUCATION: Education details: Educated on role and purpose of OT as well as potential interventions and goals for therapy based on initial evaluation findings. Person educated: Patient and Spouse Education method: Explanation and Handouts Education comprehension: verbalized understanding and needs further education   HOME EXERCISE PROGRAM: Provided with Self-ROM exercises and handout    GOALS: Goals reviewed with patient? Yes  SHORT TERM GOALS: Target date: 09/28/21  Pt will be independent with HEP to address RUE ROM. Baseline: Goal status: INITIAL  2.  Pt will be able to demonstrate increased awareness of positioning of RUE during functional tasks and ability to compensate for decreased sensation/proprioception. Baseline:  Goal status: INITIAL  3.  Pt will be able to utilize dominant RUE as  gross assist during self-care tasks. Baseline:  Goal status: INITIAL  4.  Pt will be able to complete UB dressing with setup assist only. Baseline:  Goal status: INITIAL    LONG TERM GOALS: Target date: 10/26/21  Pt and spouse will verbalize understanding of adapted strategies and utilized DME/AE as needed to maximize safety and I with ADLs/ IADLs . Baseline:  Goal status: INITIAL  2.  Pt will be able to utilize dominant RUE at diminished level during self-care tasks. Baseline:  Goal status: INITIAL  3.  Pt will be able to complete UB dressing Mod I. Baseline:  Goal status: INITIAL  4.  Pt will be able to complete LB dressing with supervision/setup. Baseline:  Goal status: INITIAL  5.  Pt will be able to grasp 1" block with RUE to demonstrate improved functional use of RUE to progress towards engagement in objective assessment of box and  blocks. Baseline:  Goal status: INITIAL   ASSESSMENT:  CLINICAL IMPRESSION: Patient is a 56 y.o. female who was seen today for occupational therapy evaluation due to progressive impairments of dominant RUE impacting ability to engage in ADLs and IADLs at [facility]. Pt lives with spouse in a one level home with 2 steps to enter.  Pt's husband has been assisting pt more with ADLs up to Mod assist and Min assist to Hand held assist with mobility and transfers.  Pt will benefit from skilled occupational therapy services to address strength and coordination, ROM, pain management, altered sensation, balance, GM/FM control, cognition, safety awareness, introduction of compensatory strategies/AE prn, visual-perception, and implementation of an HEP to improve participation and safety during ADLs and IADLs.   PERFORMANCE DEFICITS in functional skills including ADLs, IADLs, coordination, dexterity, proprioception, sensation, ROM, strength, pain, FMC, GMC, mobility, balance, body mechanics, endurance, decreased knowledge of precautions, decreased knowledge of use of DME, skin integrity, and UE functional use, cognitive skills including emotional, sequencing, thought, and understand, and psychosocial skills including environmental adaptation.   IMPAIRMENTS are limiting patient from ADLs, IADLs, and social participation.   COMORBIDITIES may have co-morbidities  that affects occupational performance. Patient will benefit from skilled OT to address above impairments and improve overall function.  MODIFICATION OR ASSISTANCE TO COMPLETE EVALUATION: Min-Moderate modification of tasks or assist with assess necessary to complete an evaluation.  OT OCCUPATIONAL PROFILE AND HISTORY: Detailed assessment: Review of records and additional review of physical, cognitive, psychosocial history related to current functional performance.  CLINICAL DECISION MAKING: Moderate - several treatment options, min-mod task modification  necessary  REHAB POTENTIAL: Fair  EVALUATION COMPLEXITY: Moderate    PLAN: OT FREQUENCY: 2x/week  OT DURATION: 8 weeks  PLANNED INTERVENTIONS: self care/ADL training, therapeutic exercise, therapeutic activity, neuromuscular re-education, manual therapy, passive range of motion, balance training, functional mobility training, splinting, moist heat, cryotherapy, patient/family education, cognitive remediation/compensation, visual/perceptual remediation/compensation, psychosocial skills training, energy conservation, coping strategies training, and DME and/or AE instructions  RECOMMENDED OTHER SERVICES: SLP  CONSULTED AND AGREED WITH PLAN OF CARE: Patient and family member/caregiver  PLAN FOR NEXT SESSION: review self-ROM and begin additional PROM/AAROM exercises as appropriate, WB if able   Ameriah Lint, [place], OTR/L 08/28/2021, 2:49 PM

## 2021-08-29 DIAGNOSIS — C711 Malignant neoplasm of frontal lobe: Secondary | ICD-10-CM | POA: Diagnosis not present

## 2021-08-29 NOTE — Addendum Note (Signed)
Addended by: Kerrie Buffalo on: 08/29/2021 03:59 PM   Modules accepted: Orders

## 2021-08-30 DIAGNOSIS — C711 Malignant neoplasm of frontal lobe: Secondary | ICD-10-CM | POA: Diagnosis not present

## 2021-08-31 ENCOUNTER — Ambulatory Visit: Payer: Self-pay | Admitting: Physical Therapy

## 2021-08-31 ENCOUNTER — Encounter: Payer: Self-pay | Admitting: Occupational Therapy

## 2021-08-31 NOTE — Addendum Note (Signed)
Addended by: Frazier Butt on: 08/31/2021 08:31 AM   Modules accepted: Orders

## 2021-09-04 DIAGNOSIS — C711 Malignant neoplasm of frontal lobe: Secondary | ICD-10-CM | POA: Diagnosis not present

## 2021-09-04 NOTE — Therapy (Incomplete)
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  Rationale for Evaluation and Treatment Rehabilitation  SUBJECTIVE:                                                                                                                                                                                              SUBJECTIVE STATEMENT: Husband provides most of history; new tumor in parietal region. Expressive aphasia since March, weakness worsening on the right, since early April 2023.To start radiation tomorrow; chemo upcoming; foot drop is worsening in   the past few weeks.  Husband is with her at all times. Pt accompanied by: self and significant other  PERTINENT HISTORY: PMH:  HTN, HLD, seizures, anaplastic astrocytoma (x 2 years)  PAIN:  Are you having pain? No  PRECAUTIONS: Fall and Other: to start again radiation, chemo  PATIENT GOALS Goals for therapy to stop progression of  weakness and help get her stronger, more use of arm and leg.    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  Per 5/23 pathology report:  Sections show a high-grade glial neoplasm with astrocytic nuclear features, occasional multinucleated giant cells, scattered mitotic figures, and palisading and geographic necrosis  COGNITION: Overall cognitive status:  Expressive  aphasia (present since March 2023)   MUSCLE TONE: RLE: Hypotonic    POSTURE: No Significant postural limitations   LOWER EXTREMITY MMT:    MMT Right Eval Left Eval  Hip flexion 3-/5 5/5  Hip extension    Hip abduction    Hip adduction    Hip internal rotation    Hip external rotation    Knee flexion  5/5  Knee extension 2/5 5/5  Ankle dorsiflexion 1/5 5/5  Ankle plantarflexion    Ankle inversion    Ankle eversion    (Blank rows = not tested)  TRANSFERS: Assistive device utilized: None  Sit to stand: CGA Stand to sit: CGA   GAIT: Gait pattern: step through pattern, decreased arm swing- Right, decreased step length- Right, decreased hip/knee flexion- Right, decreased ankle dorsiflexion- Right, and poor foot clearance- Right  Fast speed, decreased safety awareness with gait. Distance walked: 60 ft x 2 Assistive device utilized:  HHA of PT Level of assistance: Min A Comments: 55M :  13.28 sec = 2.47 ft/sec *Gait trial with clinic foot-up brace on RLE-pt demo improved foot clearance with use of foot-up brace.  FUNCTIONAL TESTs:  5 times sit to stand: 16.19 sec Timed up and go (TUG): 14.84 sec   TODAY'S TREATMENT:  Initiated HEP-see below   PATIENT EDUCATION: Education details:  PT eval results, POC, benefit of foot-up brace; initiated HEP. Person educated: Patient and Spouse Education method: Explanation, Demonstration, and Handouts Education comprehension: verbalized understanding and returned demonstration   HOME EXERCISE PROGRAM: Access Code: [email or web address] URL: [email or web address] Date: 08/28/2021 Prepared by: [facility]  Exercises - Sit to Stand  - 2-3 x daily - 7 x weekly - 1 sets - 5-10 reps - Seated Gluteal Sets  - 2 x daily - 7 x weekly - 1-2 sets - 10 reps - 3 sec    GOALS: Goals reviewed with patient? Yes  SHORT TERM GOALS: Target date: 10/02/2021  Pt will be supervision with HEP for improved strength, balance, gait. Baseline: Goal status: IN PROGRESS  2.  Pt will improve 5x sit<>stand to less than or equal to 13 sec to demonstrate improved functional strength and transfer efficiency. Baseline: 16.19 sec Goal status: IN PROGRESS  3.  Pt will demo safe use of appropriate orthotic and assistive device for improved safety with functional mobility and gait. Baseline: Trial of foot-up brace in session; family plans to order Goal status: IN PROGRESS   LONG TERM GOALS: Target date: 10/30/2021  Pt will be supervision with HEP for improved strength, balance, transfers, and gait. Baseline:  Goal status: IN PROGRESS  2.  Pt will improve 5x sit<>stand to less than or equal to 11.5 sec to demonstrate improved functional strength and transfer efficiency. Baseline:  Goal status: IN PROGRESS  3.  Pt will improve TUG score to less than or equal to 13.5 sec for decreased fall risk. Baseline: 14.84 sec Goal status: IN PROGRESS  4.  Pt will improve gait velocity to at least 2.62 ft/sec for improved gait efficiency and safety. Baseline: 2.47 ft/sec Goal status: IN PROGRESS   ASSESSMENT:  CLINICAL IMPRESSION: Patient is a 56 y.o. female who was seen today for physical therapy evaluation and  treatment for Malignant neoplasm of brain, which has resulted in RUE and RLE weakness and expressive aphasia over the past 2-3 months.  Pt was previously independent with gait, mobility, ADLS.  She currently requires min assist/CGA for safety with gait.  She  is at fall risk per TUG and FTSTS scores.  She would benefit from skilled PT to address impairments to improve functional mobility, independence, and decreased fall risk.    OBJECTIVE IMPAIRMENTS Abnormal gait, decreased balance, decreased mobility, difficulty walking, decreased strength, decreased safety awareness, impaired flexibility, and impaired tone.   ACTIVITY LIMITATIONS standing, squatting, stairs, transfers, and locomotion level  PARTICIPATION LIMITATIONS: shopping, community activity, and occupation  PERSONAL FACTORS HTN, HLD, seizures, anaplastic astrocytoma are also affecting patient's functional outcome.   REHAB POTENTIAL: Good  CLINICAL DECISION MAKING: Unstable/unpredictable  EVALUATION COMPLEXITY: High  PLAN: PT FREQUENCY: 2x/week  PT DURATION: 8 weeks  PLANNED INTERVENTIONS: Therapeutic exercises, Therapeutic activity, Neuromuscular re-education, Balance training, Gait training, Patient/Family education, Joint mobilization, Orthotic/Fit training, DME instructions, and Manual therapy  PLAN FOR NEXT SESSION: Review initial HEP; further provide updates to HEP for strengthening.  Gait training with foot-up brace, with appropriate assistive device (cane vs. Walker with ?platform?).  May want to include supine hip and LLE strengthening, standing balance.  Monitor pt's activity tolerance due to starting radiation on 08/29/21.   Manuela Neptune, PT 09/04/2021, 1:52 PM

## 2021-09-05 ENCOUNTER — Encounter: Payer: Self-pay | Admitting: Occupational Therapy

## 2021-09-05 ENCOUNTER — Ambulatory Visit: Payer: 59 | Admitting: Physical Therapy

## 2021-09-05 ENCOUNTER — Ambulatory Visit: Payer: 59 | Admitting: Occupational Therapy

## 2021-09-05 DIAGNOSIS — M6281 Muscle weakness (generalized): Secondary | ICD-10-CM

## 2021-09-05 DIAGNOSIS — C711 Malignant neoplasm of frontal lobe: Secondary | ICD-10-CM | POA: Diagnosis not present

## 2021-09-05 DIAGNOSIS — I69851 Hemiplegia and hemiparesis following other cerebrovascular disease affecting right dominant side: Secondary | ICD-10-CM

## 2021-09-05 DIAGNOSIS — R2689 Other abnormalities of gait and mobility: Secondary | ICD-10-CM

## 2021-09-05 DIAGNOSIS — R278 Other lack of coordination: Secondary | ICD-10-CM

## 2021-09-05 DIAGNOSIS — R2681 Unsteadiness on feet: Secondary | ICD-10-CM

## 2021-09-05 DIAGNOSIS — R41840 Attention and concentration deficit: Secondary | ICD-10-CM

## 2021-09-05 NOTE — Patient Instructions (Signed)
Access Code: [email or web address] URL: [email or web address] Date: 09/05/2021 Prepared by: [facility]  Exercises - Seated Gluteal Sets  - 2-3 x daily - 7 x weekly - 2 sets - 10 reps - 3 sec hold - Sit to stand in stride stance  - 2 x daily - 7 x weekly - 2 sets - 10 reps - Seated Hamstring Stretch  - 2-3 x daily - 7 x weekly - 1 sets - 3 reps - 10-15 sec hold - Standing Gastroc Stretch on Step  - 1 x daily - 7 x weekly - 1 sets - 3 reps - 10-15 sec hold - Seated Gastroc Stretch with Strap  - 2-3 x daily - 7 x weekly - 1 sets - 3 reps - 10-15 sec hold

## 2021-09-05 NOTE — Therapy (Signed)
OUTPATIENT OCCUPATIONAL THERAPY TREATMENT NOTE  Patient Name: Kristen Foley MRN: [id number] DOB:[birth year], 56 y.o., female Today's Date: 09/05/2021  PCP: Unk Pinto, MD REFERRING PROVIDER: Holzmacher, [place]ia [place], NP    OT End of Session - 09/05/21 1453     Visit Number 2    Number of Visits 17    Date for OT Re-Evaluation 10/26/21    Authorization Type [employer]et[employer]    OT Start Time 1447    OT Stop Time 1538    OT Time Calculation (min) 51 min    Activity Tolerance Patient tolerated treatment well    Behavior During Therapy WFL for tasks assessed/performed   tearful           Past Medical History:  Diagnosis Date   Allergy    Anemia    Past Surgical History:  Procedure Laterality Date   [place]   Patient Active Problem List   Diagnosis Date Noted   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   Expressive aphasia 11/26/2019   Seizures ([place]) 05/31/2019   Hypoalbuminemia 05/31/2019   Hypomagnesemia    Numbness and tingling of right leg 05/30/2019   Speech disturbance 05/30/2019   Transient alteration of awareness 05/30/2019   Fatigue 03/18/2018   FH: hypertension 03/18/2018   Abnormal glucose 03/18/2018   Hyperlipidemia, mixed 03/18/2018   Elevated BP without diagnosis of hypertension 03/18/2018   Vitamin D deficiency 03/18/2018    ONSET DATE: symptoms beginning about 4-6 weeks ago, referral date 08/23/21  REFERRING DIAG: C71.9 (ICD-10-CM) - Malignant neoplasm of brain, unspecified   THERAPY DIAG:  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)  Attention and concentration deficit  Other lack of coordination  Muscle weakness (generalized)  Other abnormalities of gait and mobility  Unsteadiness on feet  Rationale for Evaluation and Treatment Rehabilitation  SUBJECTIVE:   SUBJECTIVE STATEMENT: Pt's spouse reports they have been completing exercises OT administered during  evaluation Pt accompanied by: significant other  PERTINENT HISTORY: labile HTN, HLD, Prediabetes and Vitamin D Deficiency who was 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 No  PATIENT GOALS to be independent   OBJECTIVE:   TODAY'S TREAMENT: Neuromuscular reeducation for attempted activation of R scapula and shoulder ROM. First trialed use of UE Ranger to achieve shoulder flex/ext w/out significant success and difficulty palpating contraction. OT then incorporated tall mirror in front of pt as a visual aid during all exercises. Pt able to achieve trace scapular elevation inconsistently in front of mirror, attempting both single-arm elevation and BUE elevation w/ no significant difference in level of contraction. Slight increase in success when incorporating breath, though pt requiring max cues to prevent compensatory movement patterns w/ trunk during these repetitions. Also able to achieve trace scapular retraction w/ slightly increased success when OT provided support at shoulder and under RUE. Pt completed light weight bearing through RUE, shifting weight toward R side w/ OT providing significant support at shoulder, anteriorly and posteriorly, w/ pt also requiring assist for hand placement on mat. OT provided pertinent education during all exercises, answering pt and spouse's questions prn.   PATIENT EDUCATION: Provided condition-specific education, particularly regarding positioning and support of RUE for decreasing risk of further injury and increased protection (also discussed hemi support sling options for self-purchase), hypertonicity and spasticity, neuromuscular reeducation, and benefit of SLP services (to receive  evaluation next week) Person educated: Patient and Spouse Education method: Explanation and Handouts Education comprehension: verbalized understanding  and needs further education   HOME EXERCISE PROGRAM: Provided with Self-ROM exercises and handout  Medbridge Code: [id number] - Seated Shoulder Shrugs  - 5-10 reps - Seated Scapular Retraction  - 5-10 reps   GOALS: Goals reviewed with patient? Yes  SHORT TERM GOALS: Target date: 09/28/21  Pt will be independent with HEP to address RUE ROM. Baseline: Goal status: IN PROGRESS  2.  Pt will be able to demonstrate increased awareness of positioning of RUE during functional tasks and ability to compensate for decreased sensation/proprioception. Baseline:  Goal status: IN PROGRESS  3.  Pt will be able to utilize dominant RUE as gross assist during self-care tasks. Baseline:  Goal status: IN PROGRESS  4.  Pt will be able to complete UB dressing with setup assist only. Baseline:  Goal status: IN PROGRESS   LONG TERM GOALS: Target date: 10/26/21  Pt and spouse will verbalize understanding of adapted strategies and utilized DME/AE as needed to maximize safety and I with ADLs/ IADLs . Baseline:  Goal status: IN PROGRESS  2.  Pt will be able to utilize dominant RUE at diminished level during self-care tasks. Baseline:  Goal status: IN PROGRESS  3.  Pt will be able to complete UB dressing Mod I. Baseline:  Goal status: IN PROGRESS  4.  Pt will be able to complete LB dressing with supervision/setup. Baseline:  Goal status: IN PROGRESS  5.  Pt will be able to grasp 1" block with RUE to demonstrate improved functional use of RUE to progress towards engagement in objective assessment of box and blocks. Baseline:  Goal status: IN PROGRESS   ASSESSMENT:  CLINICAL IMPRESSION: Pt returns for first treatment session following initial evaluation last week on 08/28/21. OT continued to address NMR of R, dominant UE w/ trace and inconsistent activation palpated of scapular elevation, retraction, and shoulder extension. OT provided significant education this session (see above) and  encouraged pt regarding intention and goals in OP therapies when pt became tearful. No significant difference in awareness or level of activation observed when attempting exercises after light weight bearing compared when when completing prior. Aphasia, w/ expressive aphasia seemingly more impacted then receptive aphasia continued to be limiting and clearly frustrating for pt this session.  PERFORMANCE DEFICITS in functional skills including ADLs, IADLs, coordination, dexterity, proprioception, sensation, ROM, strength, pain, FMC, GMC, mobility, balance, body mechanics, endurance, decreased knowledge of precautions, decreased knowledge of use of DME, skin integrity, and UE functional use, cognitive skills including emotional, sequencing, thought, and understand, and psychosocial skills including environmental adaptation.   IMPAIRMENTS are limiting patient from ADLs, IADLs, and social participation.   COMORBIDITIES may have co-morbidities  that affects occupational performance. Patient will benefit from skilled OT to address above impairments and improve overall function.   PLAN: OT FREQUENCY: 2x/week  OT DURATION: 8 weeks  PLANNED INTERVENTIONS: self care/ADL training, therapeutic exercise, therapeutic activity, neuromuscular re-education, manual therapy, passive range of motion, balance training, functional mobility training, splinting, moist heat, cryotherapy, patient/family education, cognitive remediation/compensation, visual/perceptual remediation/compensation, psychosocial skills training, energy conservation, coping strategies training, and DME and/or AE instructions  RECOMMENDED OTHER SERVICES: SLP  CONSULTED AND AGREED WITH PLAN OF CARE: Patient and family member/caregiver  PLAN FOR NEXT SESSION: Pt's husband to self-purchase hemi shoulder support sling - trial for fit next session prn; review HEP and begin additional PROM/AAROM exercises as appropriate, WB w/ significant shoulder  support prn   Kathrine Cords, OTR/L 09/05/2021, 5:24 PM

## 2021-09-05 NOTE — Therapy (Signed)
OUTPATIENT PHYSICAL THERAPY TREATMENT NOTE   Patient Name: Kristen Foley MRN: [id number] DOB:June 05, 1965, 56 y.o., female Today's Date: 09/05/2021  PCP: Unk Pinto, MD REFERRING PROVIDER: Margarita Grizzle, NP  END OF SESSION:   PT End of Session - 09/05/21 1512     Visit Number 2    Number of Visits 17    Date for PT Re-Evaluation 10/23/21    Authorization Type Aetna    PT Start Time 1538    PT Stop Time 1619    PT Time Calculation (min) 41 min    Equipment Utilized During Treatment --    Activity Tolerance Patient tolerated treatment well    Behavior During Therapy WFL for tasks assessed/performed             Past Medical History:  Diagnosis Date   Allergy    Anemia    Past Surgical History:  Procedure Laterality Date   [place]   Patient Active Problem List   Diagnosis Date Noted   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   Expressive aphasia 11/26/2019   Seizures ([place]) 05/31/2019   Hypoalbuminemia 05/31/2019   Hypomagnesemia    Numbness and tingling of right leg 05/30/2019   Speech disturbance 05/30/2019   Transient alteration of awareness 05/30/2019   Fatigue 03/18/2018   FH: hypertension 03/18/2018   Abnormal glucose 03/18/2018   Hyperlipidemia, mixed 03/18/2018   Elevated BP without diagnosis of hypertension 03/18/2018   Vitamin D deficiency 03/18/2018    REFERRING DIAG:  C71.9 (ICD-10-CM) - Malignant neoplasm of brain, unspecified   THERAPY DIAG:  Muscle weakness (generalized)  Other abnormalities of gait and mobility  Unsteadiness on feet  Rationale for Evaluation and Treatment Rehabilitation  PERTINENT HISTORY: HTN, HLD, seizures, anaplastic astrocytoma (x 2 years)  PRECAUTIONS: Fall and Other: to start again radiation, chemo  SUBJECTIVE: "Do I need a walker?"  Had last of 5 doses of radiation today; Some mornings better than others.  Got the foot-up brace and it is helping (per husband report)  PAIN:  Are  you having pain? No   OBJECTIVE:    TODAY'S TREATMENT: 09/05/2021 Activity Comments  Reviewed HEP:   Sit<>stand x 10 reps Seated glut sets Cues to hold at end range for 3 sec; pt reports sit<>stand is easy  Sit<>Stand with RLE tucked posteriorly, 2 x 10 reps Cues to keep R heel on ground  Seated hamstring stretch, sitting on mat, foot propped on aerobic step, 3 x 10 seconds Cues for technique  Seated gastroc stretch, with PT assist to correctly pull belt for gastroc stretch, 3 x 10 sec Standing gastroc stretch, foot propped on 4" step-pt reports this is too much stretch, so educated to try/use 2" block or book at home Cues and assist for technique.  Discussed this and standing option for gastroc stretch and that husband will need to help for optimal technique and safety.  Gait training, wearing R foot up brace:  Trialed RW with RUE platform 40 ft x 2  Trialed gait with HHA on R side, PT supporting R elbow and hand around waist for support 50 ft x 2 Trialed gait with HHA on L side, PT also supporting R elbow, arm around waist, 50 ft x 2 Pt very unsteady and walk on two legs at times, min assist and consistent guidance of RW from PT Improved stability, improved RLE step length and gait pattern   Unsteady gait, decreased consistent RLE step length   Discussed optimal gait pattern and  assistance from husband; pt/husband agree that husband supporting R elbow and arm around waist will allow for safety and best gait pattern    Access Code: [email or web address] URL: [email or web address] Date: 09/05/2021 Prepared by: [facility]  Exercises - Seated Gluteal Sets  - 2-3 x daily - 7 x weekly - 2 sets - 10 reps - 3 sec hold - Sit to stand in stride stance  - 2 x daily - 7 x weekly - 2 sets - 10 reps - Seated Hamstring Stretch  - 2-3 x daily - 7 x weekly - 1 sets - 3 reps - 10-15 sec hold - Standing Gastroc Stretch on Step  - 1 x daily - 7 x weekly - 1 sets  - 3 reps - 10-15 sec hold - Seated Gastroc Stretch with Strap  - 2-3 x daily - 7 x weekly - 1 sets - 3 reps - 10-15 sec hold  GOALS: Goals reviewed with patient? Yes   SHORT TERM GOALS: Target date: 09/25/2021   Pt will be supervision with HEP for improved strength, balance, gait. Baseline: Goal status: IN PROGRESS   2.  Pt will improve 5x sit<>stand to less than or equal to 13 sec to demonstrate improved functional strength and transfer efficiency. Baseline: 16.19 sec Goal status: IN PROGRESS   3.  Pt will demo safe use of appropriate orthotic and assistive device for improved safety with functional mobility and gait. Baseline: Trial of foot-up brace in session; family plans to order Goal status: IN PROGRESS     LONG TERM GOALS: Target date: 10/23/2021   Pt will be supervision with HEP for improved strength, balance, transfers, and gait. Baseline:  Goal status:IN PROGRESS   2.  Pt will improve 5x sit<>stand to less than or equal to 11.5 sec to demonstrate improved functional strength and transfer efficiency. Baseline:  Goal status: IN PROGRESS   3.  Pt will improve TUG score to less than or equal to 13.5 sec for decreased fall risk. Baseline: 14.84 sec Goal status: IN PROGRESS   4.  Pt will improve gait velocity to at least 2.62 ft/sec for improved gait efficiency and safety. Baseline: 2.47 ft/sec Goal status: IN PROGRESS     ASSESSMENT:   CLINICAL IMPRESSION: Skilled PT session focused on review of HEP and additions to address further RLE strength and flexibility.  Also addressed gait safety and gait pattern; she is using foot-up brace that husband ordered, and this is helping with more consistent foot clearance.  Focus with gait training was supporting R elbow, per OT recommendation to allow for optimal RUE positioning and for safety with gait.  She will continue to benefit from skilled PT towards improved functional mobility and decresaed fall risk.     OBJECTIVE  IMPAIRMENTS Abnormal gait, decreased balance, decreased mobility, difficulty walking, decreased strength, decreased safety awareness, impaired flexibility, and impaired tone.    ACTIVITY LIMITATIONS standing, squatting, stairs, transfers, and locomotion level   PARTICIPATION LIMITATIONS: shopping, community activity, and occupation   PERSONAL FACTORS HTN, HLD, seizures, anaplastic astrocytoma are also affecting patient's functional outcome.    REHAB POTENTIAL: Good   CLINICAL DECISION MAKING: Unstable/unpredictable   EVALUATION COMPLEXITY: High   PLAN: PT FREQUENCY: 2x/week   PT DURATION: 8 weeks   PLANNED INTERVENTIONS: Therapeutic exercises, Therapeutic activity, Neuromuscular re-education, Balance training, Gait training, Patient/Family education, Joint mobilization, Orthotic/Fit training, DME instructions, and Manual therapy   PLAN FOR NEXT SESSION: Review updates to HEP; further  work on RLE strengthening, standing balance.  Gait training with foot-up brace.  May want to include supine hip and LLE strengthening, standing balance.  Monitor pt's activity tolerance due to starting radiation on 08/29/21.      Berlyn Saylor W., PT 09/05/2021, 6:00 PM

## 2021-09-10 ENCOUNTER — Ambulatory Visit: Payer: 59 | Admitting: Occupational Therapy

## 2021-09-10 ENCOUNTER — Ambulatory Visit: Payer: 59 | Attending: Adult Health Nurse Practitioner

## 2021-09-10 DIAGNOSIS — R2681 Unsteadiness on feet: Secondary | ICD-10-CM | POA: Insufficient documentation

## 2021-09-10 DIAGNOSIS — I69851 Hemiplegia and hemiparesis following other cerebrovascular disease affecting right dominant side: Secondary | ICD-10-CM | POA: Insufficient documentation

## 2021-09-10 DIAGNOSIS — M6281 Muscle weakness (generalized): Secondary | ICD-10-CM

## 2021-09-10 DIAGNOSIS — R482 Apraxia: Secondary | ICD-10-CM | POA: Insufficient documentation

## 2021-09-10 DIAGNOSIS — R41840 Attention and concentration deficit: Secondary | ICD-10-CM | POA: Insufficient documentation

## 2021-09-10 DIAGNOSIS — R4701 Aphasia: Secondary | ICD-10-CM | POA: Diagnosis not present

## 2021-09-10 DIAGNOSIS — R278 Other lack of coordination: Secondary | ICD-10-CM | POA: Insufficient documentation

## 2021-09-10 DIAGNOSIS — R2689 Other abnormalities of gait and mobility: Secondary | ICD-10-CM | POA: Diagnosis not present

## 2021-09-10 NOTE — Therapy (Signed)
OUTPATIENT PHYSICAL THERAPY TREATMENT NOTE   Patient Name: Kristen Foley MRN: [id number] DOB:Apr 15, 1965, 56 y.o., female Today's Date: 09/10/2021  PCP: Unk Pinto, MD REFERRING PROVIDER: Margarita Grizzle, NP  END OF SESSION:   PT End of Session - 09/10/21 1054     Visit Number 3    Number of Visits 17    Date for PT Re-Evaluation 10/23/21    Authorization Type Aetna    PT Start Time 1100    PT Stop Time 5361    PT Time Calculation (min) 45 min    Activity Tolerance Patient tolerated treatment well    Behavior During Therapy WFL for tasks assessed/performed             Past Medical History:  Diagnosis Date   Allergy    Anemia    Past Surgical History:  Procedure Laterality Date   [place]   Patient Active Problem List   Diagnosis Date Noted   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   Expressive aphasia 11/26/2019   Seizures ([place]) 05/31/2019   Hypoalbuminemia 05/31/2019   Hypomagnesemia    Numbness and tingling of right leg 05/30/2019   Speech disturbance 05/30/2019   Transient alteration of awareness 05/30/2019   Fatigue 03/18/2018   FH: hypertension 03/18/2018   Abnormal glucose 03/18/2018   Hyperlipidemia, mixed 03/18/2018   Elevated BP without diagnosis of hypertension 03/18/2018   Vitamin D deficiency 03/18/2018    REFERRING DIAG:  C71.9 (ICD-10-CM) - Malignant neoplasm of brain, unspecified   THERAPY DIAG:  Muscle weakness (generalized)  Other abnormalities of gait and mobility  Unsteadiness on feet  Hemiplegia and hemiparesis following other cerebrovascular disease affecting right dominant side ([place])  Rationale for Evaluation and Treatment Rehabilitation  PERTINENT HISTORY: HTN, HLD, seizures, anaplastic astrocytoma (x 2 years)  PRECAUTIONS: Fall and Other: to start again radiation, chemo  SUBJECTIVE: "Having bad aphasia today, yesterday had a good day. Working on exercises and had momentary use of RUE with  better word finding.   PAIN:  Are you having pain? No   OBJECTIVE:    TODAY'S TREATMENT: 09/10/2021 Activity Comments  Reviewed HEP:   Sit<>stand x 10 reps Seated glut sets Cues to hold at end range for 3 sec; pt reports sit<>stand is easy  Sit<>Stand with RLE tucked posteriorly, 2 x 10 reps Cues to keep R heel on ground  Standing left foot on step 3x15 sec Blocking right knee for extension  Retro-walking 6x20 ft; sidestepping 6x20 ft Tandem gait: 6x20 ft   Seated hamstring stretch, sitting on mat, foot propped on aerobic step, 3 x 10 seconds Cues for technique   Standing gastroc stretch, foot propped on 4" step-pt reports this is too much stretch, so educated to try/use 2" block or book at home Cues and assist for technique.  Discussed this and standing option for gastroc stretch and that husband will need to help for optimal technique and safety.  Gait training, wearing R foot up brace:  Trialed trekking pole      Standing on airex pad: 3x30 sec, 1x30 sec eyes closed     Access Code: [email or web address] URL: [email or web address] Date: 09/05/2021 Prepared by: [facility]  Exercises - Seated Gluteal Sets  - 2-3 x daily - 7 x weekly - 2 sets - 10 reps - 3 sec hold - Sit to stand in stride stance  - 2 x daily - 7 x weekly - 2 sets - 10 reps -  Seated Hamstring Stretch  - 2-3 x daily - 7 x weekly - 1 sets - 3 reps - 10-15 sec hold - Standing Gastroc Stretch on Step  - 1 x daily - 7 x weekly - 1 sets - 3 reps - 10-15 sec hold - Seated Gastroc Stretch with Strap  - 2-3 x daily - 7 x weekly - 1 sets - 3 reps - 10-15 sec hold - Sidestepping along kitchen counter - Backwards walking along kitchen counter -LLE foot on step for RLE weight bearing GOALS: Goals reviewed with patient? Yes   SHORT TERM GOALS: Target date: 09/25/2021   Pt will be supervision with HEP for improved strength, balance, gait. Baseline: Goal status: IN PROGRESS   2.   Pt will improve 5x sit<>stand to less than or equal to 13 sec to demonstrate improved functional strength and transfer efficiency. Baseline: 16.19 sec Goal status: IN PROGRESS   3.  Pt will demo safe use of appropriate orthotic and assistive device for improved safety with functional mobility and gait. Baseline: Trial of foot-up brace in session; family plans to order Goal status: IN PROGRESS     LONG TERM GOALS: Target date: 10/23/2021   Pt will be supervision with HEP for improved strength, balance, transfers, and gait. Baseline:  Goal status:IN PROGRESS   2.  Pt will improve 5x sit<>stand to less than or equal to 11.5 sec to demonstrate improved functional strength and transfer efficiency. Baseline:  Goal status: IN PROGRESS   3.  Pt will improve TUG score to less than or equal to 13.5 sec for decreased fall risk. Baseline: 14.84 sec Goal status: IN PROGRESS   4.  Pt will improve gait velocity to at least 2.62 ft/sec for improved gait efficiency and safety. Baseline: 2.47 ft/sec Goal status: IN PROGRESS     ASSESSMENT:   CLINICAL IMPRESSION: Gait training with single trekking pole which pt did not care for. Continued with strength/balance activities to improve RLE loading response and enhance forced weight bearing to RLE for improved proprioception and motor control. Proximal weakness causing difficulty with loading response RLE and with limited hip flexion. Continued sessions indicated to progress strength, balance, and motor control to reduce risk for falls.     OBJECTIVE IMPAIRMENTS Abnormal gait, decreased balance, decreased mobility, difficulty walking, decreased strength, decreased safety awareness, impaired flexibility, and impaired tone.    ACTIVITY LIMITATIONS standing, squatting, stairs, transfers, and locomotion level   PARTICIPATION LIMITATIONS: shopping, community activity, and occupation   PERSONAL FACTORS HTN, HLD, seizures, anaplastic astrocytoma are also  affecting patient's functional outcome.    REHAB POTENTIAL: Good   CLINICAL DECISION MAKING: Unstable/unpredictable   EVALUATION COMPLEXITY: High   PLAN: PT FREQUENCY: 2x/week   PT DURATION: 8 weeks   PLANNED INTERVENTIONS: Therapeutic exercises, Therapeutic activity, Neuromuscular re-education, Balance training, Gait training, Patient/Family education, Joint mobilization, Orthotic/Fit training, DME instructions, and Manual therapy   PLAN FOR NEXT SESSION: Review updates to HEP; further work on RLE strengthening, standing balance.  Gait training with foot-up brace.  May want to include supine hip and LLE strengthening, standing balance.  Monitor pt's activity tolerance due to starting radiation on 08/29/21.      Toniann Fail, PT 09/10/2021, 10:54 AM

## 2021-09-10 NOTE — Therapy (Signed)
OUTPATIENT OCCUPATIONAL THERAPY TREATMENT NOTE  Patient Name: Kristen Foley MRN: [id number] DOB:Mar 18, 1966, 56 y.o., female Today's Date: 09/10/2021  PCP: Unk Pinto, MD REFERRING PROVIDER: Holzmacher, [place]ia [place], NP    OT End of Session - 09/10/21 1520     Visit Number 3    Number of Visits 17    Date for OT Re-Evaluation 10/26/21    Authorization Type Aetna    OT Start Time 1152    OT Stop Time 4098    OT Time Calculation (min) 43 min    Activity Tolerance Patient tolerated treatment well    Behavior During Therapy [facility] for tasks assessed/performed   tearful            Past Medical History:  Diagnosis Date   Allergy    Anemia    Past Surgical History:  Procedure Laterality Date   [place]   Patient Active Problem List   Diagnosis Date Noted   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   Expressive aphasia 11/26/2019   Seizures ([place]) 05/31/2019   Hypoalbuminemia 05/31/2019   Hypomagnesemia    Numbness and tingling of right leg 05/30/2019   Speech disturbance 05/30/2019   Transient alteration of awareness 05/30/2019   Fatigue 03/18/2018   FH: hypertension 03/18/2018   Abnormal glucose 03/18/2018   Hyperlipidemia, mixed 03/18/2018   Elevated BP without diagnosis of hypertension 03/18/2018   Vitamin D deficiency 03/18/2018    ONSET DATE: symptoms beginning about 4-6 weeks ago, referral date 08/23/21  REFERRING DIAG: C71.9 (ICD-10-CM) - Malignant neoplasm of brain, unspecified   THERAPY DIAG:  Muscle weakness (generalized)  Hemiplegia and hemiparesis following other cerebrovascular disease affecting right dominant side ([place])  Other lack of coordination  Rationale for Evaluation and Treatment Rehabilitation  SUBJECTIVE:   SUBJECTIVE STATEMENT: Pt's spouse reports she "had a really good day yesterday". Pt accompanied by: significant other  PERTINENT HISTORY: labile HTN, HLD, Prediabetes and Vitamin D Deficiency who was dx'd  with an anaplastic Astrocytoma in Feb 2021 and is followed at [facility] with ongoing bimonthly chemo    PRECAUTIONS: Fall  PAIN:  Are you having pain? No  PATIENT GOALS to be independent   OBJECTIVE:   TODAY'S TREAMENT: Therapist fit pt new shoulder brace to ensure proper fit and support to shoulder.  Therapist utilized mirror to allow for pt to visualize proper fit as well as had pt's husband don and doff brace to ensure understanding of proper placement and fit.  Educated on purpose of shoulder brace due to increased chance of developing subluxation.   Neuromuscular reeducation for attempted activation of R scapula and shoulder ROM. Therapist utilized Geologist, engineering in front of pt for visual feedback during mobility. Pt able to achieve trace scapular elevation inconsistently in front of mirror, attempting both single-arm elevation and BUE elevation with no significant difference in level of contraction. Also able to achieve trace scapular retraction when therapist providing support at shoulder and under RUE. Therapist providing manual facilitation under RUE to facilitate scapular elevation and retraction.  Reiterated self-ROM exercises, pt requiring max hand over hand assist to position appropriately for "rock the baby".  Pt benefiting from multimodal cues, hand placement, and use of mirror - however still demonstrating difficulty with motor planning of exercises.   PATIENT EDUCATION: Provided condition-specific education, particularly regarding positioning and support of RUE for decreasing risk of further injury and increased protection (also discussed hemi support sling options for self-purchase), hypertonicity and spasticity, neuromuscular reeducation, and benefit of SLP  services (to receive evaluation next week) Person educated: Patient and Spouse Education method: Explanation and Handouts Education comprehension: verbalized understanding and needs further education   HOME EXERCISE  PROGRAM: Provided with Self-ROM exercises and handout  Medbridge Code: [id number] - Seated Shoulder Shrugs  - 5-10 reps - Seated Scapular Retraction  - 5-10 reps   GOALS: Goals reviewed with patient? Yes  SHORT TERM GOALS: Target date: 09/28/21  Pt will be independent with HEP to address RUE ROM. Baseline: Goal status: IN PROGRESS  2.  Pt will be able to demonstrate increased awareness of positioning of RUE during functional tasks and ability to compensate for decreased sensation/proprioception. Baseline:  Goal status: IN PROGRESS  3.  Pt will be able to utilize dominant RUE as gross assist during self-care tasks. Baseline:  Goal status: IN PROGRESS  4.  Pt will be able to complete UB dressing with setup assist only. Baseline:  Goal status: IN PROGRESS   LONG TERM GOALS: Target date: 10/26/21  Pt and spouse will verbalize understanding of adapted strategies and utilized DME/AE as needed to maximize safety and I with ADLs/ IADLs . Baseline:  Goal status: IN PROGRESS  2.  Pt will be able to utilize dominant RUE at diminished level during self-care tasks. Baseline:  Goal status: IN PROGRESS  3.  Pt will be able to complete UB dressing Mod I. Baseline:  Goal status: IN PROGRESS  4.  Pt will be able to complete LB dressing with supervision/setup. Baseline:  Goal status: IN PROGRESS  5.  Pt will be able to grasp 1" block with RUE to demonstrate improved functional use of RUE to progress towards engagement in objective assessment of box and blocks. Baseline:  Goal status: IN PROGRESS   ASSESSMENT:  CLINICAL IMPRESSION: Therapist continued to address NMR of dominant RUE with trace and inconsistent activation palpated of scapular elevation, retraction, and shoulder extension. Pt benefits from multimodal cues, demonstration, tactile cues, hand over hand, and use of mirror for visual feedback- however still demonstrating difficulty with motor planning of exercises. Aphasia,  with expressive aphasia seemingly more impacted then receptive aphasia continued to be limiting and clearly frustrating for pt this session.  Pt tearful during session due to frustration with movements and inability to complete mobility as directed.  PERFORMANCE DEFICITS in functional skills including ADLs, IADLs, coordination, dexterity, proprioception, sensation, ROM, strength, pain, FMC, GMC, mobility, balance, body mechanics, endurance, decreased knowledge of precautions, decreased knowledge of use of DME, skin integrity, and UE functional use, cognitive skills including emotional, sequencing, thought, and understand, and psychosocial skills including environmental adaptation.   IMPAIRMENTS are limiting patient from ADLs, IADLs, and social participation.   COMORBIDITIES may have co-morbidities  that affects occupational performance. Patient will benefit from skilled OT to address above impairments and improve overall function.   PLAN: OT FREQUENCY: 2x/week  OT DURATION: 8 weeks  PLANNED INTERVENTIONS: self care/ADL training, therapeutic exercise, therapeutic activity, neuromuscular re-education, manual therapy, passive range of motion, balance training, functional mobility training, splinting, moist heat, cryotherapy, patient/family education, cognitive remediation/compensation, visual/perceptual remediation/compensation, psychosocial skills training, energy conservation, coping strategies training, and DME and/or AE instructions  RECOMMENDED OTHER SERVICES: SLP  CONSULTED AND AGREED WITH PLAN OF CARE: Patient and family member/caregiver  PLAN FOR NEXT SESSION: Review HEP and begin additional PROM/AAROM exercises as appropriate, WB w/ significant shoulder support prn   Ammarie Matsuura, OTR/L 09/10/2021, 3:21 PM

## 2021-09-11 NOTE — Therapy (Signed)
OUTPATIENT PHYSICAL THERAPY TREATMENT NOTE   Patient Name: Kristen Foley MRN: [id number] DOB:[birth year], 56 y.o., female Today's Date: 09/12/2021  PCP: Unk Pinto, MD REFERRING PROVIDER: Margarita Grizzle, NP  END OF SESSION:   PT End of Session - 09/12/21 1145     Visit Number 4    Number of Visits 17    Date for PT Re-Evaluation 10/23/21    Authorization Type Aetna    PT Start Time 1105    PT Stop Time 1144    PT Time Calculation (min) 39 min    Activity Tolerance Patient tolerated treatment well    Behavior During Therapy [facility] for tasks assessed/performed   patient became emotional during session a few times             Past Medical History:  Diagnosis Date   Allergy    Anemia    Past Surgical History:  Procedure Laterality Date   [place]   Patient Active Problem List   Diagnosis Date Noted   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   Expressive aphasia 11/26/2019   Seizures ([place]) 05/31/2019   Hypoalbuminemia 05/31/2019   Hypomagnesemia    Numbness and tingling of right leg 05/30/2019   Speech disturbance 05/30/2019   Transient alteration of awareness 05/30/2019   Fatigue 03/18/2018   FH: hypertension 03/18/2018   Abnormal glucose 03/18/2018   Hyperlipidemia, mixed 03/18/2018   Elevated BP without diagnosis of hypertension 03/18/2018   Vitamin D deficiency 03/18/2018    REFERRING DIAG:  C71.9 (ICD-10-CM) - Malignant neoplasm of brain, unspecified   THERAPY DIAG:  Muscle weakness (generalized)  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)  Other abnormalities of gait and mobility  Unsteadiness on feet  Rationale for Evaluation and Treatment Rehabilitation  PERTINENT HISTORY: HTN, HLD, seizures, anaplastic astrocytoma (x 2 years)  PRECAUTIONS: Fall and Other: to start again radiation, chemo  SUBJECTIVE: Husband reports aphasia has been bad the last couple of  days.  PAIN:  Are you having pain? No   OBJECTIVE:     TODAY'S TREATMENT: 09/12/21 Activity Comments  review of new HEP: STS in stride stance 5x sitting R HS stretch 30" sitting R gastroc stretch with strap 30" standing gastroc stretch on step 30" sidestepping counter (PT supporting R arm and guarding) backwards walking counter (PT supporting R arm and guarding) L foot on step balance 2x30" with occasional UE support, PT min A, and PT pushing R knee into extension    Bridge 10x Good form  R single leg bridge 10x PT preventing R ankle inversion and hip ER  R SAQ with PT assist Limited ROM d/t greater use of glutes than quads  quad set R LE with towel roll 8x3" Trace contraction; heavy use of glutes   Mini squat  10x PT holding R hand in place on counter top and leading hips back; verbal cues to avoid deep squat   PATIENT EDUCATION: Education details: HEP update; review of HEP Person educated: Patient and Spouse Education method: Explanation, Demonstration, Tactile cues, Verbal cues, and Handouts Education comprehension: verbalized understanding and returned demonstration  Access Code: [email or web address] URL: [email or web address] Date: 09/12/2021 Prepared by: [facility]  Exercises - Seated Gluteal Sets  - 2-3 x daily - 7 x weekly - 2 sets - 10 reps - 3 sec hold - Sit to stand in stride stance  - 2 x daily - 7 x weekly - 2 sets -  10 reps - Seated Hamstring Stretch  - 2-3 x daily - 7 x weekly - 1 sets - 3 reps - 10-15 sec hold - Standing Gastroc Stretch on Step  - 1 x daily - 7 x weekly - 1 sets - 3 reps - 10-15 sec hold - Seated Gastroc Stretch with Strap  - 2-3 x daily - 7 x weekly - 1 sets - 3 reps - 10-15 sec hold - Mini Squat with Counter Support  - 1 x daily - 5 x weekly - 2 sets - 10 reps - Single Leg Bridge  - 1 x daily - 5 x weekly - 2 sets - 10 reps - Side Stepping with Counter Support  - 1 x daily - 5 x weekly - 2 sets - 10  reps - Walking with Counter Support  - 1 x daily - 5 x weekly - 2 sets - 10 reps - Single Leg Stance with Support  - 1 x daily - 5 x weekly - 2 sets - 30 sec hold  GOALS: Goals reviewed with patient? Yes   SHORT TERM GOALS: Target date: 09/25/2021   Pt will be supervision with HEP for improved strength, balance, gait. Baseline: Goal status: MET 09/12/21   2.  Pt will improve 5x sit<>stand to less than or equal to 13 sec to demonstrate improved functional strength and transfer efficiency. Baseline: 16.19 sec Goal status: IN PROGRESS   3.  Pt will demo safe use of appropriate orthotic and assistive device for improved safety with functional mobility and gait. Baseline: Trial of foot-up brace in session; family plans to order Goal status: IN PROGRESS     LONG TERM GOALS: Target date: 10/23/2021   Pt will be supervision with HEP for improved strength, balance, transfers, and gait. Baseline:  Goal status:IN PROGRESS   2.  Pt will improve 5x sit<>stand to less than or equal to 11.5 sec to demonstrate improved functional strength and transfer efficiency. Baseline:  Goal status: IN PROGRESS   3.  Pt will improve TUG score to less than or equal to 13.5 sec for decreased fall risk. Baseline: 14.84 sec Goal status: IN PROGRESS   4.  Pt will improve gait velocity to at least 2.62 ft/sec for improved gait efficiency and safety. Baseline: 2.47 ft/sec Goal status: IN PROGRESS     ASSESSMENT:   CLINICAL IMPRESSION: Patient arrived to session with husband who reports that aphasia has been more of an issue for the past couple of days. Reviewed previously administered HEP for max understanding and carryover. Patient required close guarding with standing activities and manual positioning of R hand d/t low tone. Worked on exercises to elicit R quad contraction which was difficult in supine, best success with mini squats. Patient did however demonstrate good glute contraction/use with single leg  bridges. Updated HEP with xerces hat were well-tolerated today. Husband reported understanding and no complaints at end of session.      OBJECTIVE IMPAIRMENTS Abnormal gait, decreased balance, decreased mobility, difficulty walking, decreased strength, decreased safety awareness, impaired flexibility, and impaired tone.    ACTIVITY LIMITATIONS standing, squatting, stairs, transfers, and locomotion level   PARTICIPATION LIMITATIONS: shopping, community activity, and occupation   PERSONAL FACTORS HTN, HLD, seizures, anaplastic astrocytoma are also affecting patient's functional outcome.    REHAB POTENTIAL: Good   CLINICAL DECISION MAKING: Unstable/unpredictable   EVALUATION COMPLEXITY: High   PLAN: PT FREQUENCY: 2x/week   PT DURATION: 8 weeks   PLANNED INTERVENTIONS: Therapeutic exercises, Therapeutic activity,  Neuromuscular re-education, Balance training, Gait training, Patient/Family education, Joint mobilization, Orthotic/Fit training, DME instructions, and Manual therapy   PLAN FOR NEXT SESSION: Review updates to HEP; further work on RLE strengthening, standing balance.  Gait training with foot-up brace.  May want to include supine hip and LLE strengthening, standing balance.  Monitor pt's activity tolerance due to starting radiation on 08/29/21.      Janene Harvey, PT, DPT 09/12/21 11:53 AM  [facility] at [facility] [address], [place], [place] [address] Phone # [phone] Fax # [phone]

## 2021-09-12 ENCOUNTER — Ambulatory Visit: Payer: 59 | Admitting: Physical Therapy

## 2021-09-12 ENCOUNTER — Ambulatory Visit: Payer: 59 | Admitting: Occupational Therapy

## 2021-09-12 ENCOUNTER — Encounter: Payer: Self-pay | Admitting: Physical Therapy

## 2021-09-12 DIAGNOSIS — I69851 Hemiplegia and hemiparesis following other cerebrovascular disease affecting right dominant side: Secondary | ICD-10-CM

## 2021-09-12 DIAGNOSIS — R482 Apraxia: Secondary | ICD-10-CM | POA: Diagnosis not present

## 2021-09-12 DIAGNOSIS — R2681 Unsteadiness on feet: Secondary | ICD-10-CM

## 2021-09-12 DIAGNOSIS — R41840 Attention and concentration deficit: Secondary | ICD-10-CM | POA: Diagnosis not present

## 2021-09-12 DIAGNOSIS — M6281 Muscle weakness (generalized): Secondary | ICD-10-CM

## 2021-09-12 DIAGNOSIS — R4701 Aphasia: Secondary | ICD-10-CM | POA: Diagnosis not present

## 2021-09-12 DIAGNOSIS — R278 Other lack of coordination: Secondary | ICD-10-CM | POA: Diagnosis not present

## 2021-09-12 DIAGNOSIS — R2689 Other abnormalities of gait and mobility: Secondary | ICD-10-CM | POA: Diagnosis not present

## 2021-09-12 NOTE — Therapy (Signed)
OUTPATIENT OCCUPATIONAL THERAPY TREATMENT NOTE  Patient Name: Kristen Foley MRN: [id number] DOB:1966/02/04, 56 y.o., female Today's Date: 09/12/2021  PCP: Unk Pinto, MD REFERRING PROVIDER: Holzmacher, [place], NP    OT End of Session - 09/12/21 1606     Visit Number 4    Number of Visits 17    Date for OT Re-Evaluation 10/26/21    Authorization Type Aetna    OT Start Time 1147    OT Stop Time 1229    OT Time Calculation (min) 42 min    Activity Tolerance Patient tolerated treatment well    Behavior During Therapy [facility] for tasks assessed/performed   tearful             Past Medical History:  Diagnosis Date   Allergy    Anemia    Past Surgical History:  Procedure Laterality Date   [place]   Patient Active Problem List   Diagnosis Date Noted   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   Expressive aphasia 11/26/2019   Seizures ([place]) 05/31/2019   Hypoalbuminemia 05/31/2019   Hypomagnesemia    Numbness and tingling of right leg 05/30/2019   Speech disturbance 05/30/2019   Transient alteration of awareness 05/30/2019   Fatigue 03/18/2018   FH: hypertension 03/18/2018   Abnormal glucose 03/18/2018   Hyperlipidemia, mixed 03/18/2018   Elevated BP without diagnosis of hypertension 03/18/2018   Vitamin D deficiency 03/18/2018    ONSET DATE: symptoms beginning about 4-6 weeks ago, referral date 08/23/21  REFERRING DIAG: C71.9 (ICD-10-CM) - Malignant neoplasm of brain, unspecified   THERAPY DIAG:  Muscle weakness (generalized)  Hemiplegia and hemiparesis following other cerebrovascular disease affecting right dominant side ([place])  Other lack of coordination  Rationale for Evaluation and Treatment Rehabilitation  SUBJECTIVE:   SUBJECTIVE STATEMENT: Pt's spouse reports aphasia has been worse this morning, but seems to be improving as the day goes on. Pt accompanied by: significant other  PERTINENT HISTORY: labile HTN, HLD,  Prediabetes and Vitamin D Deficiency who was dx'd with an anaplastic Astrocytoma in Feb 2021 and is followed at [facility] with ongoing bimonthly chemo    PRECAUTIONS: Fall  PAIN:  Are you having pain? No  PATIENT GOALS to be independent   OBJECTIVE:   TODAY'S TREAMENT: Neuromuscular reeducation in sidelying for attempted activation of R scapula and shoulder ROM. Therapist utilized Geologist, engineering in front of pt for visual feedback during mobility. Pt able to achieve trace scapular elevation inconsistently in front of mirror, benefiting most from target and facilitation under RUE for improved placement and activation.  Attempted to engage in towel glides on table for shoulder flexion/extension and scaption. Therapist providing tapping at biceps and triceps for activation with minimal impact.  Pt tearful and became increasingly agitated during session, most likely due to aphasia and lack of movement as pt stating "I can move it" and "I can hear".   Reiterated self-ROM exercises in sitting.  Reiterated exercises completed today were in similar nature to self-ROM exercises but in sidelying.    PATIENT EDUCATION: Provided condition-specific education, particularly regarding positioning and support of RUE for decreasing risk of further injury and increased protection, hypertonicity and spasticity, neuromuscular reeducation Person educated: Patient and Spouse Education method: Explanation and Handouts Education comprehension: verbalized understanding and needs further education   HOME EXERCISE PROGRAM: Provided with Self-ROM exercises and handout  Medbridge Code: [id number] - Seated Shoulder Shrugs  - 5-10 reps - Seated Scapular Retraction  - 5-10 reps   GOALS:  Goals reviewed with patient? Yes  SHORT TERM GOALS: Target date: 09/28/21  Pt will be independent with HEP to address RUE ROM. Baseline: Goal status: IN PROGRESS  2.  Pt will be able to demonstrate increased awareness of  positioning of RUE during functional tasks and ability to compensate for decreased sensation/proprioception. Baseline:  Goal status: IN PROGRESS  3.  Pt will be able to utilize dominant RUE as gross assist during self-care tasks. Baseline:  Goal status: IN PROGRESS  4.  Pt will be able to complete UB dressing with setup assist only. Baseline:  Goal status: IN PROGRESS   LONG TERM GOALS: Target date: 10/26/21  Pt and spouse will verbalize understanding of adapted strategies and utilized DME/AE as needed to maximize safety and I with ADLs/ IADLs . Baseline:  Goal status: IN PROGRESS  2.  Pt will be able to utilize dominant RUE at diminished level during self-care tasks. Baseline:  Goal status: IN PROGRESS  3.  Pt will be able to complete UB dressing Mod I. Baseline:  Goal status: IN PROGRESS  4.  Pt will be able to complete LB dressing with supervision/setup. Baseline:  Goal status: IN PROGRESS  5.  Pt will be able to grasp 1" block with RUE to demonstrate improved functional use of RUE to progress towards engagement in objective assessment of box and blocks. Baseline:  Goal status: IN PROGRESS   ASSESSMENT:  CLINICAL IMPRESSION: Therapist continued to address NMR of dominant RUE in sidelying this session with focus on scapular elevation and shoulder flexion/extension.  Pt able to demonstrate trace and inconsistent activation of scapular elevation. Pt benefits from multimodal cues, demonstration, tactile cues, hand over hand, and use of mirror for visual feedback- however still demonstrating difficulty with motor planning of exercises. Aphasia, with expressive aphasia seemingly more impacted then receptive aphasia, continues to be limiting and clearly frustrating for pt this session.  Pt tearful during session due to frustration with movements and inability to complete mobility as directed.  PERFORMANCE DEFICITS in functional skills including ADLs, IADLs, coordination,  dexterity, proprioception, sensation, ROM, strength, pain, FMC, GMC, mobility, balance, body mechanics, endurance, decreased knowledge of precautions, decreased knowledge of use of DME, skin integrity, and UE functional use, cognitive skills including emotional, sequencing, thought, and understand, and psychosocial skills including environmental adaptation.   IMPAIRMENTS are limiting patient from ADLs, IADLs, and social participation.   COMORBIDITIES may have co-morbidities  that affects occupational performance. Patient will benefit from skilled OT to address above impairments and improve overall function.   PLAN: OT FREQUENCY: 2x/week  OT DURATION: 8 weeks  PLANNED INTERVENTIONS: self care/ADL training, therapeutic exercise, therapeutic activity, neuromuscular re-education, manual therapy, passive range of motion, balance training, functional mobility training, splinting, moist heat, cryotherapy, patient/family education, cognitive remediation/compensation, visual/perceptual remediation/compensation, psychosocial skills training, energy conservation, coping strategies training, and DME and/or AE instructions  RECOMMENDED OTHER SERVICES: SLP  CONSULTED AND AGREED WITH PLAN OF CARE: Patient and family member/caregiver  PLAN FOR NEXT SESSION: Review HEP and begin additional PROM/AAROM exercises as appropriate, WB w/ significant shoulder support prn   Daijah Scrivens, [place], OTR/L 09/12/2021, 4:07 PM

## 2021-09-14 NOTE — Therapy (Signed)
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  Rationale for Evaluation and Treatment Rehabilitation  PERTINENT HISTORY: HTN, HLD, seizures, anaplastic astrocytoma (x 2 years)  PRECAUTIONS: Fall and Other: to start again radiation, chemo  SUBJECTIVE: Husband reports aphasia has been bad the last couple of days.  PAIN:  Are you having pain? No   OBJECTIVE:     TODAY'S TREATMENT: 09/17/21 Activity Comments                       HEP last updated 09/12/21: Access Code: [email or web address] URL: [email or web address] Date: 09/12/2021 Prepared by: [facility]  Exercises - Seated Gluteal Sets  - 2-3 x daily - 7 x weekly - 2 sets - 10 reps - 3 sec hold - Sit to stand in stride stance  - 2 x daily - 7 x weekly - 2 sets - 10 reps - Seated Hamstring Stretch  - 2-3 x daily - 7 x weekly - 1 sets - 3 reps - 10-15 sec hold - Standing  Gastroc Stretch on Step  - 1 x daily - 7 x weekly - 1 sets - 3 reps - 10-15 sec hold - Seated Gastroc Stretch with Strap  - 2-3 x daily - 7 x weekly - 1 sets - 3 reps - 10-15 sec hold - Mini Squat with Counter Support  - 1 x daily - 5 x weekly - 2 sets - 10 reps - Single Leg Bridge  - 1 x daily - 5 x weekly - 2 sets - 10 reps - Side Stepping with Counter Support  - 1 x daily - 5 x weekly - 2 sets - 10 reps - Walking with Counter Support  - 1 x daily - 5 x weekly - 2 sets - 10 reps - Single Leg Stance with Support  - 1 x daily - 5 x weekly - 2 sets - 30 sec hold  GOALS: Goals reviewed with patient? Yes   SHORT TERM GOALS: Target date: 09/25/2021   Pt will be supervision with HEP for improved strength, balance, gait. Baseline: Goal status: MET 09/12/21   2.  Pt will improve 5x sit<>stand to less than or equal to 13 sec to demonstrate improved functional strength and transfer efficiency. Baseline: 16.19 sec Goal status: IN PROGRESS   3.  Pt will demo safe use of appropriate orthotic and assistive device for improved safety with functional mobility and gait. Baseline: Trial  of foot-up brace in session; family plans to order Goal status: IN PROGRESS     LONG TERM GOALS: Target date: 10/23/2021   Pt will be supervision with HEP for improved strength, balance, transfers, and gait. Baseline:  Goal status:IN PROGRESS   2.  Pt will improve 5x sit<>stand to less than or equal to 11.5 sec to demonstrate improved functional strength and transfer efficiency. Baseline:  Goal status: IN PROGRESS   3.  Pt will improve TUG score to less than or equal to 13.5 sec for decreased fall risk. Baseline: 14.84 sec Goal status: IN PROGRESS   4.  Pt will improve gait velocity to at least 2.62 ft/sec for improved gait efficiency and safety. Baseline: 2.47 ft/sec Goal status: IN PROGRESS     ASSESSMENT:   CLINICAL IMPRESSION: Patient arrived to session with husband who reports that aphasia has been  more of an issue for the past couple of days. Reviewed previously administered HEP for max understanding and carryover. Patient required close guarding with standing activities and manual positioning of R hand d/t low tone. Worked on exercises to elicit R quad contraction which was difficult in supine, best success with mini squats. Patient did however demonstrate good glute contraction/use with single leg bridges. Updated HEP with xerces hat were well-tolerated today. Husband reported understanding and no complaints at end of session.      OBJECTIVE IMPAIRMENTS Abnormal gait, decreased balance, decreased mobility, difficulty walking, decreased strength, decreased safety awareness, impaired flexibility, and impaired tone.    ACTIVITY LIMITATIONS standing, squatting, stairs, transfers, and locomotion level   PARTICIPATION LIMITATIONS: shopping, community activity, and occupation   PERSONAL FACTORS HTN, HLD, seizures, anaplastic astrocytoma are also affecting patient's functional outcome.    REHAB POTENTIAL: Good   CLINICAL DECISION MAKING: Unstable/unpredictable   EVALUATION COMPLEXITY: High   PLAN: PT FREQUENCY: 2x/week   PT DURATION: 8 weeks   PLANNED INTERVENTIONS: Therapeutic exercises, Therapeutic activity, Neuromuscular re-education, Balance training, Gait training, Patient/Family education, Joint mobilization, Orthotic/Fit training, DME instructions, and Manual therapy   PLAN FOR NEXT SESSION: Review updates to HEP; further work on RLE strengthening, standing balance.  Gait training with foot-up brace.  May want to include supine hip and LLE strengthening, standing balance.  Monitor pt's activity tolerance due to starting radiation on 08/29/21.      Janene Harvey, PT, DPT 09/14/21 9:28 AM  [facility] at [facility] [address] [place], [place], [place] [address] Phone # [phone] Fax # [phone]

## 2021-09-17 ENCOUNTER — Ambulatory Visit: Payer: 59 | Admitting: Physical Therapy

## 2021-09-17 ENCOUNTER — Ambulatory Visit: Payer: 59 | Admitting: Occupational Therapy

## 2021-09-17 ENCOUNTER — Encounter: Payer: Self-pay | Admitting: Physical Therapy

## 2021-09-17 DIAGNOSIS — M6281 Muscle weakness (generalized): Secondary | ICD-10-CM

## 2021-09-17 DIAGNOSIS — R41840 Attention and concentration deficit: Secondary | ICD-10-CM | POA: Diagnosis not present

## 2021-09-17 DIAGNOSIS — R4701 Aphasia: Secondary | ICD-10-CM | POA: Diagnosis not present

## 2021-09-17 DIAGNOSIS — I69851 Hemiplegia and hemiparesis following other cerebrovascular disease affecting right dominant side: Secondary | ICD-10-CM | POA: Diagnosis not present

## 2021-09-17 DIAGNOSIS — R482 Apraxia: Secondary | ICD-10-CM | POA: Diagnosis not present

## 2021-09-17 DIAGNOSIS — R278 Other lack of coordination: Secondary | ICD-10-CM | POA: Diagnosis not present

## 2021-09-17 DIAGNOSIS — R2681 Unsteadiness on feet: Secondary | ICD-10-CM | POA: Diagnosis not present

## 2021-09-17 DIAGNOSIS — R2689 Other abnormalities of gait and mobility: Secondary | ICD-10-CM | POA: Diagnosis not present

## 2021-09-17 NOTE — Therapy (Signed)
OUTPATIENT OCCUPATIONAL THERAPY TREATMENT NOTE  Patient Name: KOLBEE STALLMAN MRN: [id number] DOB:1965/09/07, 56 y.o., female Today's Date: 09/17/2021  PCP: Unk Pinto, MD REFERRING PROVIDER: Holzmacher, [place], NP    OT End of Session - 09/17/21 1335     Visit Number 5    Number of Visits 17    Date for OT Re-Evaluation 10/26/21    Authorization Type Aet[employer]    OT Start Time 1108    OT Stop Time 1147    OT Time Calculation (min) 39 min    Activity Tolerance Patient tolerated treatment well    Behavior During Therapy [facility] for tasks assessed/performed   tearful              Past Medical History:  Diagnosis Date   Allergy    Anemia    Past Surgical History:  Procedure Laterality Date   [place]   Patient Active Problem List   Diagnosis Date Noted   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   Expressive aphasia 11/26/2019   Seizures ([place]) 05/31/2019   Hypoalbuminemia 05/31/2019   Hypomagnesemia    Numbness and tingling of right leg 05/30/2019   Speech disturbance 05/30/2019   Transient alteration of awareness 05/30/2019   Fatigue 03/18/2018   FH: hypertension 03/18/2018   Abnormal glucose 03/18/2018   Hyperlipidemia, mixed 03/18/2018   Elevated BP without diagnosis of hypertension 03/18/2018   Vitamin D deficiency 03/18/2018    ONSET DATE: symptoms beginning about 4-6 weeks ago, referral date 08/23/21  REFERRING DIAG: C71.9 (ICD-10-CM) - Malignant neoplasm of brain, unspecified   THERAPY DIAG:  Muscle weakness (generalized)  Hemiplegia and hemiparesis following other cerebrovascular disease affecting right dominant side ([place])  Other lack of coordination  Rationale for Evaluation and Treatment Rehabilitation  SUBJECTIVE:   SUBJECTIVE STATEMENT: Pt's spouse reports the weekend was busy, with working in the barn and helping with horses.   Pt accompanied by: significant other  PERTINENT HISTORY: labile HTN, HLD, Prediabetes  and Vitamin D Deficiency who was dx'd with an anaplastic Astrocytoma in Feb 2021 and is followed at [facility] with ongoing bimonthly chemo    PRECAUTIONS: Fall  PAIN:  Are you having pain? No  PATIENT GOALS to be independent   OBJECTIVE:   TODAY'S TREAMENT: Neuromuscular reeducation in sitting position with use of UE Ranger to attempt increased activation of R scapula and shoulder ROM.  Pt able to elicit inconsistent trace movements during shoulder flexion and horizontal abduction/adduction with UE Ranger and therapist providing support at elbow to decrease effects of gravity.  Pt continues to benefit from external visual cues, such as mirror or use of target.   NMR: with focus on shoulder ROM in all planes with no activation palpated without UE Ranger.  Therapist providing tapping at muscle belly to attempt to further facilitate movements.  ADL: educated on hemi-dressing technique as pt's spouse reports assisting her with UB dressing.  Pt able to doff and don jacket with increased time with good problem solving and sequencing of hemi-technique.  Pt tearful when discussing dressing and use of AE/alternative techniques to continue to promote progress and independence with ADLs.     PATIENT EDUCATION: Provided condition-specific education, particularly regarding positioning and support of RUE for decreasing risk of further injury and increased protection, hypertonicity and spasticity, neuromuscular reeducation Person educated: Patient and Spouse Education method: Explanation and Handouts Education comprehension: verbalized understanding and needs further education   HOME EXERCISE PROGRAM: Provided with Self-ROM exercises and handout  Medbridge Code: [id number] - Seated Shoulder Shrugs  - 5-10 reps - Seated Scapular Retraction  - 5-10 reps   GOALS: Goals reviewed with patient? Yes  SHORT TERM GOALS: Target date: 09/28/21  Pt will be independent with HEP to address RUE  ROM. Baseline: Goal status: IN PROGRESS  2.  Pt will be able to demonstrate increased awareness of positioning of RUE during functional tasks and ability to compensate for decreased sensation/proprioception. Baseline:  Goal status: IN PROGRESS  3.  Pt will be able to utilize dominant RUE as gross assist during self-care tasks. Baseline:  Goal status: IN PROGRESS  4.  Pt will be able to complete UB dressing with setup assist only. Baseline:  Goal status: IN PROGRESS   LONG TERM GOALS: Target date: 10/26/21  Pt and spouse will verbalize understanding of adapted strategies and utilized DME/AE as needed to maximize safety and I with ADLs/ IADLs . Baseline:  Goal status: IN PROGRESS  2.  Pt will be able to utilize dominant RUE at diminished level during self-care tasks. Baseline:  Goal status: IN PROGRESS  3.  Pt will be able to complete UB dressing Mod I. Baseline:  Goal status: IN PROGRESS  4.  Pt will be able to complete LB dressing with supervision/setup. Baseline:  Goal status: IN PROGRESS  5.  Pt will be able to grasp 1" block with RUE to demonstrate improved functional use of RUE to progress towards engagement in objective assessment of box and blocks. Baseline:  Goal status: IN PROGRESS   ASSESSMENT:  CLINICAL IMPRESSION: Therapist continued to address NMR of dominant RUE in sitting this session with focus on shoulder ROM with and without UE Ranger.  Pt able to demonstrate trace and inconsistent activation of shoulder flexion with gravity minimized. Pt benefits from multimodal cues, demonstration, tactile cues, hand over hand, and use of mirror or target for visual feedback- however still demonstrating difficulty with motor planning of exercises. Aphasia, with expressive aphasia seemingly more impacted then receptive aphasia, continues to be limiting and clearly frustrating for pt this session.  Pt tearful during session when discussing ADLs and use of sling for  support/decrease risk of subluxation.  PERFORMANCE DEFICITS in functional skills including ADLs, IADLs, coordination, dexterity, proprioception, sensation, ROM, strength, pain, FMC, GMC, mobility, balance, body mechanics, endurance, decreased knowledge of precautions, decreased knowledge of use of DME, skin integrity, and UE functional use, cognitive skills including emotional, sequencing, thought, and understand, and psychosocial skills including environmental adaptation.   IMPAIRMENTS are limiting patient from ADLs, IADLs, and social participation.   COMORBIDITIES may have co-morbidities  that affects occupational performance. Patient will benefit from skilled OT to address above impairments and improve overall function.   PLAN: OT FREQUENCY: 2x/week  OT DURATION: 8 weeks  PLANNED INTERVENTIONS: self care/ADL training, therapeutic exercise, therapeutic activity, neuromuscular re-education, manual therapy, passive range of motion, balance training, functional mobility training, splinting, moist heat, cryotherapy, patient/family education, cognitive remediation/compensation, visual/perceptual remediation/compensation, psychosocial skills training, energy conservation, coping strategies training, and DME and/or AE instructions  RECOMMENDED OTHER SERVICES: SLP  CONSULTED AND AGREED WITH PLAN OF CARE: Patient and family member/caregiver  PLAN FOR NEXT SESSION: Review HEP and begin additional PROM/AAROM exercises as appropriate, WB w/ significant shoulder support prn   Elvie Maines, OTR/L 09/17/2021, 1:36 PM

## 2021-09-18 NOTE — Therapy (Signed)
OUTPATIENT PHYSICAL THERAPY TREATMENT NOTE   Patient Name: Kristen Foley MRN: [id number] DOB:09-10-1965, 56 y.o., female Today's Date: 09/19/2021  PCP: Unk Pinto, MD REFERRING PROVIDER: Margarita Grizzle, NP  END OF SESSION:   PT End of Session - 09/19/21 1542     Visit Number 6    Number of Visits 17    Date for PT Re-Evaluation 10/23/21    Authorization Type Aetna    PT Start Time 1403    PT Stop Time 1444    PT Time Calculation (min) 41 min    Equipment Utilized During Treatment Gait belt    Activity Tolerance Patient tolerated treatment well    Behavior During Therapy WFL for tasks assessed/performed   patient became emotional during session a few times               Past Medical History:  Diagnosis Date   Allergy    Anemia    Past Surgical History:  Procedure Laterality Date   [place]   Patient Active Problem List   Diagnosis Date Noted   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   Expressive aphasia 11/26/2019   Seizures ([place]) 05/31/2019   Hypoalbuminemia 05/31/2019   Hypomagnesemia    Numbness and tingling of right leg 05/30/2019   Speech disturbance 05/30/2019   Transient alteration of awareness 05/30/2019   Fatigue 03/18/2018   FH: hypertension 03/18/2018   Abnormal glucose 03/18/2018   Hyperlipidemia, mixed 03/18/2018   Elevated BP without diagnosis of hypertension 03/18/2018   Vitamin D deficiency 03/18/2018    REFERRING DIAG:  C71.9 (ICD-10-CM) - Malignant neoplasm of brain, unspecified   THERAPY DIAG:  Muscle weakness (generalized)  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)  Other abnormalities of gait and mobility  Unsteadiness on feet  Rationale for Evaluation and Treatment Rehabilitation  PERTINENT HISTORY: HTN, HLD, seizures, anaplastic astrocytoma (x 2 years)  PRECAUTIONS: Fall and Other: to start again radiation, chemo  SUBJECTIVE:  Not much new. PAIN:  Are you having pain? 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   Recumbent bike L 1.5 x 3 min Good ability without PT assist  STS with R foot back and red medball at chest 10x Cues for decrease speed when descending   Gait training with walking pole in L hand 2x25f Good AD sequencing with verbal and manual cues; intermittently losing gait pattern requiring reset  short step up with R LE 6" Understanding limited by aphasia; foot clearance limited d/t limited R hip flexion   PATIENT EDUCATION: Education details: edu husband on today's session and HEP update Person educated: Patient and Spouse Education method: Explanation, Demonstration, Tactile cues, Verbal cues, and Handouts Education comprehension: verbalized understanding and returned demonstration  HEP as of 09/19/21: Access Code: [email or web address]: [email or web address] Date: 09/19/2021 Prepared by: [facility]  Exercises - Seated Gluteal Sets  - 2-3 x daily - 7 x weekly - 2 sets - 10 reps - 3 sec hold - Sit to stand in stride stance  - 2 x daily -  7 x weekly - 2 sets - 10 reps - Seated Hamstring Stretch  - 2-3 x daily - 7 x weekly - 1 sets - 3 reps - 10-15 sec hold - Standing Gastroc Stretch on Step  - 1 x daily - 7 x weekly - 1 sets - 3 reps - 10-15 sec hold - Seated Gastroc Stretch with Strap  - 2-3 x daily - 7 x weekly - 1 sets - 3 reps - 10-15 sec hold - Mini Squat with Counter Support  - 1 x daily - 5 x weekly - 2 sets - 10 reps - Single Leg Bridge  - 1 x daily - 5 x weekly - 2 sets - 10 reps - Side  Stepping with Counter Support  - 1 x daily - 5 x weekly - 2 sets - 10 reps - Walking with Counter Support  - 1 x daily - 5 x weekly - 2 sets - 10 reps - Single Leg Stance with Support  - 1 x daily - 5 x weekly - 2 sets - 30 sec hold - Forward Step Up  - 1 x daily - 5 x weekly - 2 sets - 10 reps  GOALS: Goals reviewed with patient? Yes   SHORT TERM GOALS: Target date: 09/25/2021   Pt will be supervision with HEP for improved strength, balance, gait. Baseline: Goal status: MET 09/12/21   2.  Pt will improve 5x sit<>stand to less than or equal to 13 sec to demonstrate improved functional strength and transfer efficiency. Baseline: 16.19 sec Goal status: IN PROGRESS   3.  Pt will demo safe use of appropriate orthotic and assistive device for improved safety with functional mobility and gait. Baseline: Trial of foot-up brace in session; family plans to order Goal status: IN PROGRESS     LONG TERM GOALS: Target date: 10/23/2021   Pt will be supervision with HEP for improved strength, balance, transfers, and gait. Baseline:  Goal status:IN PROGRESS   2.  Pt will improve 5x sit<>stand to less than or equal to 11.5 sec to demonstrate improved functional strength and transfer efficiency. Baseline:  Goal status: IN PROGRESS   3.  Pt will improve TUG score to less than or equal to 13.5 sec for decreased fall risk. Baseline: 14.84 sec Goal status: IN PROGRESS   4.  Pt will improve gait velocity to at least 2.62 ft/sec for improved gait efficiency and safety. Baseline: 2.47 ft/sec Goal status: IN PROGRESS     ASSESSMENT:   CLINICAL IMPRESSION: Patient arrived to session with husband without new complaints. Trialed cardio equipment to assess for tolerance and LE control; patient with improved ability on recumbent bike rather than NuStep. Husband reports plans to have patient use their bike at home. Practiced gait training with walking pole which patient performed with fairly good  sequencing after practice and cueing. 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 Updated HEP with step ups with husband's assist. No complaints at end of session.      OBJECTIVE IMPAIRMENTS Abnormal gait, decreased balance, decreased mobility, difficulty walking, decreased strength, decreased safety awareness, impaired flexibility, and impaired tone.    ACTIVITY LIMITATIONS standing, squatting, stairs, transfers, and locomotion level   PARTICIPATION LIMITATIONS: shopping, community activity, and occupation   PERSONAL FACTORS HTN, HLD, seizures, anaplastic astrocytoma are also affecting patient's functional outcome.    REHAB POTENTIAL: Good   CLINICAL  DECISION MAKING: Unstable/unpredictable   EVALUATION COMPLEXITY: High   PLAN: PT FREQUENCY: 2x/week   PT DURATION: 8 weeks   PLANNED INTERVENTIONS: Therapeutic exercises, Therapeutic activity, Neuromuscular re-education, Balance training, Gait training, Patient/Family education, Joint mobilization, Orthotic/Fit training, DME instructions, and Manual therapy   PLAN FOR NEXT SESSION: Review updates to HEP; further work on RLE strengthening, standing balance.  Gait training with foot-up brace.  May want to include supine hip and LLE strengthening, standing balance.  Monitor pt's activity tolerance due to starting radiation on 08/29/21.     Janene Harvey, PT, DPT 09/19/21 3:51 PM  [facility] at [facility] [address] [place], [place], [place] [address] Phone # [phone] Fax # [phone]

## 2021-09-19 ENCOUNTER — Ambulatory Visit: Payer: 59 | Admitting: Speech Pathology

## 2021-09-19 ENCOUNTER — Ambulatory Visit: Payer: 59 | Admitting: Physical Therapy

## 2021-09-19 ENCOUNTER — Ambulatory Visit: Payer: 59 | Admitting: Occupational Therapy

## 2021-09-19 ENCOUNTER — Encounter: Payer: Self-pay | Admitting: Speech Pathology

## 2021-09-19 ENCOUNTER — Encounter: Payer: Self-pay | Admitting: Physical Therapy

## 2021-09-19 DIAGNOSIS — I69851 Hemiplegia and hemiparesis following other cerebrovascular disease affecting right dominant side: Secondary | ICD-10-CM

## 2021-09-19 DIAGNOSIS — R2681 Unsteadiness on feet: Secondary | ICD-10-CM | POA: Diagnosis not present

## 2021-09-19 DIAGNOSIS — M6281 Muscle weakness (generalized): Secondary | ICD-10-CM

## 2021-09-19 DIAGNOSIS — R2689 Other abnormalities of gait and mobility: Secondary | ICD-10-CM | POA: Diagnosis not present

## 2021-09-19 DIAGNOSIS — R482 Apraxia: Secondary | ICD-10-CM | POA: Diagnosis not present

## 2021-09-19 DIAGNOSIS — R4701 Aphasia: Secondary | ICD-10-CM

## 2021-09-19 DIAGNOSIS — R278 Other lack of coordination: Secondary | ICD-10-CM

## 2021-09-19 DIAGNOSIS — R41840 Attention and concentration deficit: Secondary | ICD-10-CM | POA: Diagnosis not present

## 2021-09-19 NOTE — Therapy (Signed)
OUTPATIENT SPEECH LANGUAGE PATHOLOGY APHASIA EVALUATION   Patient Name: Kristen Foley MRN: [id number] DOB:May 12, 1965, 56 y.o., female Today's Date: 09/19/2021  PCP: Unk Pinto MD REFERRING PROVIDER: Holzmacher, Mia [place], NP   End of Session - 09/19/21 1230     Visit Number 1    Number of Visits 17    Date for SLP Re-Evaluation 11/19/21    SLP Start Time 1228    SLP Stop Time  1310    SLP Time Calculation (min) 42 min    Activity Tolerance Patient tolerated treatment well;Patient limited by fatigue             Past Medical History:  Diagnosis Date   Allergy    Anemia    Past Surgical History:  Procedure Laterality Date   [place]   Patient Active Problem List   Diagnosis Date Noted   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   Expressive aphasia 11/26/2019   Seizures ([place]) 05/31/2019   Hypoalbuminemia 05/31/2019   Hypomagnesemia    Numbness and tingling of right leg 05/30/2019   Speech disturbance 05/30/2019   Transient alteration of awareness 05/30/2019   Fatigue 03/18/2018   FH: hypertension 03/18/2018   Abnormal glucose 03/18/2018   Hyperlipidemia, mixed 03/18/2018   Elevated BP without diagnosis of hypertension 03/18/2018   Vitamin D deficiency 03/18/2018    ONSET DATE: symptoms beginning about 4-6 weeks ago, referral date 08/23/21   REFERRING DIAG: 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 No  FALLS: Has patient fallen in last 6 months?  Number of falls: 3  LIVING ENVIRONMENT: Lives with: lives with their family Lives in: House/apartment  PLOF:  Level of assistance: Independent with ADLs, Needed assistance with IADLS Employment: Full-time employment   PATIENT GOALS: Increased communication of needs and wants, communication strategies for pt and communication partners  OBJECTIVE:   DIAGNOSTIC FINDINGS: See imaging.   COGNITION: Overall cognitive status: Difficulty to assess due to language impairment   AUDITORY COMPREHENSION: Overall auditory comprehension: Impaired: simple and moderately complex YES/NO questions: Impaired: moderately complex -Biographical: 50% acc given visual cue and aid for apraxia -Simple: 100% acc given visual cue -Complex: 0% acc Following directions: Impaired: simple - 2/5 simple Conversation: Simple Interfering components: motor planning Effective technique: extra processing time, pausing, repetition/stressing words, written cues, and visual/gestural cues  Comments: Trialed yes/no whiteboard written cue (with check mark and X visuals). Pt required tactile cueing to move  R limb to make selection (SLP suspects verbal and limb apraxia). Using hand-over-hand, pt made selections and was able to continue to make selections for yes/no.   **PATIENT WAS ABLE TO IDENTIFY PICTURES FROM F02 - SLP REC LOW/HIGH TECH AAC to facilitate communication**  READING COMPREHENSION: Impaired: word *further assessment warranted   EXPRESSION: verbal  VERBAL EXPRESSION: Level of generative/spontaneous verbalization: phrase Automatic speech: counting: intact - requires initial start, day of week: impaired - able to produce 4/7 with initial start (modA), and singing: intact - some verbal  errors noted (Happy Birthday, Joycelyn Das) Repetition: Impaired: sentence; intact @ word and phrase levels - Mono +Polysyllabic: 100% acc -Phrases: 100% acc -  Sentences: 40% acc  Naming: Confrontation: 0-25%; with phonemic and sentence completion cues increased to 70% (body parts only) Pragmatics: Appears intact; tearful  Interfering components:  NA Effective technique: sentence completion and phonemic cues Non-verbal means of communication: N/A  Comments:  -Sentence completion - 90% accuracy     WRITTEN EXPRESSION: Dominant hand: right; impaired UE movement  Written expression: Not tested  MOTOR SPEECH: Overall motor speech: impaired Level of impairment: Word and Phrase Respiration: diaphragmatic/abdominal breathing Phonation: normal Resonance: WFL Articulation: Appears intact Intelligibility: Intelligible Motor planning: Impaired: aware, groping for words, and inconsistent Motor speech errors: aware, groping for words, and inconsistent Interfering components:  aphasia Effective technique:  visual articulation cues from therapist   ORAL MOTOR EXAMINATION WFL  STANDARDIZED ASSESSMENTS: Informal speech/language assessment. See above for details. Subtests are taken from the following standardized aphasia assessments: MTDDDA, BDAE, ADP, RCBA   PATIENT REPORTED OUTCOME MEASURES (PROM): Did not complete due to severity of aphasia.      PATIENT EDUCATION: Education details: Aphasia; expressive vs. Receptive language Person educated: Patient and Spouse Education method: Explanation, Demonstration, and Handouts Education comprehension: verbalized understanding, verbal cues required, and needs further education *Provided pt with aphasia communication handout  GOALS: Goals reviewed with patient? Yes  SHORT TERM GOALS: Target date: 10/18/2021    Pt and husband will demonstrate use of supported communication strategies in 5 min conversation to assist in decrease of communication breakdowns.  Baseline: Goal status: INITIAL  2.  Pt will follow 1-step directions with modA verbal cues.  Baseline:  Goal status: INITIAL  3.  Pt  and husband will complete salient vocabulary list for HEP practice.  Baseline:  Goal status: INITIAL  4.  Pt will identify pictures with 90% accuracy from F06+ AAC board to increase ability to communicate wants/needs with communication partner.  Baseline:  Goal status: INITIAL   LONG TERM GOALS: Target date: 11/15/2021    Pt/husband will provide 1-2 examples of successful use of supported communication strategies at home.  Baseline:  Goal status: INITIAL  2.  Pt will follow 1-step directions given minA verbal cues. Baseline:  Goal status: INITIAL  3.  Pt/husband will provide 1-2 examples of successful use of low/high tech AAC at home. Baseline:  Goal status: INITIAL  4.  Pt will answer simple yes/no questions with 80% acc given visual/orthographic cue.  Baseline:  Goal status: INITIAL   ASSESSMENT:  CLINICAL IMPRESSION: Pt is a 56 yo female who presents to [place] for evaluation w/ hx significant for neoplasm. Pt husband was primary informant this session due to the nature of pt's aphasia. Pt was tearful throughout today's session, pt responded well to encouragement. If tasks became too difficulty, pt would become tearful again. SLP explained the importance of knowing what she can and cannot do with and without help. Pt was agreeable to complete assessment. SLP suspects pt would be a great candidate for low tech and/or high tech AAC due to her object ID ability. Pt was assessed using informal speech/language measures gathered from subtests from standardized assessments.Marland Kitchen SLP rec skilled ST services to address strategies for both pt and main communication partner as well as, identify possible means for alternative and/or augmentative communication.    OBJECTIVE IMPAIRMENTS include aphasia and apraxia. These impairments are limiting patient from ADLs/IADLs and effectively communicating at home and in community. Factors affecting  potential to achieve goals and functional outcome are  medical prognosis. Patient will benefit from skilled SLP services to address above impairments and improve overall function.  REHAB POTENTIAL: Fair due to medical prognosis  PLAN: SLP FREQUENCY: 2x/week  SLP DURATION: 8 weeks  PLANNED INTERVENTIONS: Language facilitation, Environmental controls, Cueing hierachy, Internal/external aids, Functional tasks, Multimodal communication approach, SLP instruction and feedback, Compensatory strategies, and Patient/family education    [place], [place] 09/19/2021, 12:32 PM

## 2021-09-19 NOTE — Therapy (Signed)
OUTPATIENT OCCUPATIONAL THERAPY TREATMENT NOTE  Patient Name: Kristen Foley MRN: [id number] DOB:05-22-1965, 56 y.o., female Today's Date: 09/19/2021  PCP: Unk Pinto, MD REFERRING PROVIDER: Holzmacher, Mia [place], NP    OT End of Session - 09/19/21 1414     Visit Number 6    Number of Visits 17    Date for OT Re-Evaluation 10/26/21    Authorization Type Aetna    OT Start Time 1320    OT Stop Time 1401    OT Time Calculation (min) 41 min    Activity Tolerance Patient tolerated treatment well    Behavior During Therapy [facility] for tasks assessed/performed   tearful               Past Medical History:  Diagnosis Date   Allergy    Anemia    Past Surgical History:  Procedure Laterality Date   [place]   Patient Active Problem List   Diagnosis Date Noted   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   Expressive aphasia 11/26/2019   Seizures ([place]) 05/31/2019   Hypoalbuminemia 05/31/2019   Hypomagnesemia    Numbness and tingling of right leg 05/30/2019   Speech disturbance 05/30/2019   Transient alteration of awareness 05/30/2019   Fatigue 03/18/2018   FH: hypertension 03/18/2018   Abnormal glucose 03/18/2018   Hyperlipidemia, mixed 03/18/2018   Elevated BP without diagnosis of hypertension 03/18/2018   Vitamin D deficiency 03/18/2018    ONSET DATE: symptoms beginning about 4-6 weeks ago, referral date 08/23/21  REFERRING DIAG: C71.9 (ICD-10-CM) - Malignant neoplasm of brain, unspecified   THERAPY DIAG:  Muscle weakness (generalized)  Hemiplegia and hemiparesis following other cerebrovascular disease affecting right dominant side ([place])  Other lack of coordination  Rationale for Evaluation and Treatment Rehabilitation  SUBJECTIVE:   SUBJECTIVE STATEMENT: Pt's spouse states that mornings are worse than the afternoons, in regards to the aphasia. 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 No  PATIENT GOALS to be independent   OBJECTIVE:   TODAY'S TREAMENT: NMR in sidelying with focus on education of pt and spouse in regards to hand placement to facilitate PROM and AAROM.  Therapist demonstrating hand placement and had husband position to assist during ROM as well.  Pt's husband demonstrating proper hand positioning and allowing sufficient time for pt facilitation.  Pt continues to demonstrate little to no activation of movement in RUE.  Pt demonstrating intermittent trace scapular elevation in sidelying, but unable to replicate repeatedly.  Engaged in Self-ROM in sitting with therapist providing demonstration, verbal, and tactile cues for improved technique.  Pt demonstrating increased understanding of positioning and hand placement to complete horizontal abduction/adduction with "Rock the baby".      PATIENT EDUCATION: Provided condition-specific education, particularly regarding positioning and support of RUE for decreasing risk of further injury and increased protection, hypertonicity and spasticity, neuromuscular reeducation Person educated: Patient and Spouse Education method: Explanation and Handouts Education comprehension: verbalized understanding and needs further education   HOME EXERCISE PROGRAM: Provided with Self-ROM exercises and handout  Medbridge Code: [place]    GOALS: Goals reviewed with patient? Yes  SHORT TERM GOALS: Target date: 09/28/21  Pt will be independent with HEP to address RUE ROM. Baseline: Goal status: IN PROGRESS  2.  Pt will be able to demonstrate increased awareness of positioning of RUE during functional tasks and ability to compensate for decreased sensation/proprioception. Baseline:  Goal status: IN PROGRESS  3.  Pt will be  able to utilize dominant RUE as gross assist during self-care tasks. Baseline:  Goal status: IN PROGRESS  4.  Pt will be able to complete UB dressing with setup assist only. Baseline:  Goal status: IN PROGRESS   LONG TERM GOALS: Target date: 10/26/21  Pt and spouse will verbalize understanding of adapted strategies and utilized DME/AE as needed to maximize safety and I with ADLs/ IADLs . Baseline:  Goal status: IN PROGRESS  2.  Pt will be able to utilize dominant RUE at diminished level during self-care tasks. Baseline:  Goal status: IN PROGRESS  3.  Pt will be able to complete UB dressing Mod I. Baseline:  Goal status: IN PROGRESS  4.  Pt will be able to complete LB dressing with supervision/setup. Baseline:  Goal status: IN PROGRESS  5.  Pt will be able to grasp 1" block with RUE to demonstrate improved functional use of RUE to progress towards engagement in objective assessment of box and blocks. Baseline:  Goal status: IN PROGRESS   ASSESSMENT:  CLINICAL IMPRESSION: Therapist continued to address NMR of dominant RUE in sidelying and sitting this session with focus on shoulder PROM and AAROM.  Pt able to demonstrate trace and inconsistent activation of shoulder elevation with gravity minimized. Pt's spouse demonstrating good understanding of hand placement and allowing time for pt to initiate movement.  Educated on motor homunculus and presentation in pt's current situation.  Pt benefits from multimodal cues, demonstration, tactile cues, hand over hand, and use of mirror or target for visual feedback- however still demonstrating difficulty with motor planning of exercises. Aphasia, with expressive aphasia seemingly more impacted then receptive aphasia, continues to be limiting and clearly frustrating for pt this session.    PERFORMANCE DEFICITS in functional skills including ADLs, IADLs, coordination, dexterity, proprioception, sensation, ROM, strength, pain, FMC, GMC,  mobility, balance, body mechanics, endurance, decreased knowledge of precautions, decreased knowledge of use of DME, skin integrity, and UE functional use, cognitive skills including emotional, sequencing, thought, and understand, and psychosocial skills including environmental adaptation.   IMPAIRMENTS are limiting patient from ADLs, IADLs, and social participation.   COMORBIDITIES may have co-morbidities  that affects occupational performance. Patient will benefit from skilled OT to address above impairments and improve overall function.   PLAN: OT FREQUENCY: 2x/week  OT DURATION: 8 weeks  PLANNED INTERVENTIONS: self care/ADL training, therapeutic exercise, therapeutic activity, neuromuscular re-education, manual therapy, passive range of motion, balance training, functional mobility training, splinting, moist heat, cryotherapy, patient/family education, cognitive remediation/compensation, visual/perceptual remediation/compensation, psychosocial skills training, energy conservation, coping strategies training, and DME and/or AE instructions  RECOMMENDED OTHER SERVICES: SLP  CONSULTED AND AGREED WITH PLAN OF CARE: Patient and family member/caregiver  PLAN FOR NEXT SESSION:  Review HEP and begin additional PROM/AAROM exercises as appropriate, WB w/ significant shoulder support prn   Tabbitha Janvrin, OTR/L 09/19/2021, 2:14 PM

## 2021-09-20 DIAGNOSIS — C719 Malignant neoplasm of brain, unspecified: Secondary | ICD-10-CM | POA: Diagnosis not present

## 2021-09-20 DIAGNOSIS — C718 Malignant neoplasm of overlapping sites of brain: Secondary | ICD-10-CM | POA: Diagnosis not present

## 2021-09-21 NOTE — Therapy (Signed)
OUTPATIENT PHYSICAL THERAPY TREATMENT NOTE   Patient Name: Kristen Foley MRN: [id number] DOB:Feb 15, 1966, 56 y.o., female Today's Date: 09/24/2021  PCP: Unk Pinto, MD REFERRING PROVIDER: Margarita Grizzle, NP  END OF SESSION:   PT End of Session - 09/24/21 1239     Visit Number 7    Number of Visits 17    Date for PT Re-Evaluation 10/23/21    Authorization Type Aetna    PT Start Time 1148    PT Stop Time 1231    PT Time Calculation (min) 43 min    Equipment Utilized During Treatment Gait belt    Activity Tolerance Patient tolerated treatment well    Behavior During Therapy WFL for tasks assessed/performed                 Past Medical History:  Diagnosis Date   Allergy    Anemia    Past Surgical History:  Procedure Laterality Date   [place]   Patient Active Problem List   Diagnosis Date Noted   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   Expressive aphasia 11/26/2019   Seizures ([place]) 05/31/2019   Hypoalbuminemia 05/31/2019   Hypomagnesemia    Numbness and tingling of right leg 05/30/2019   Speech disturbance 05/30/2019   Transient alteration of awareness 05/30/2019   Fatigue 03/18/2018   FH: hypertension 03/18/2018   Abnormal glucose 03/18/2018   Hyperlipidemia, mixed 03/18/2018   Elevated BP without diagnosis of hypertension 03/18/2018   Vitamin D deficiency 03/18/2018    REFERRING DIAG:  C71.9 (ICD-10-CM) - Malignant neoplasm of brain, unspecified   THERAPY DIAG:  Muscle weakness (generalized)  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)  Other abnormalities of gait and mobility  Unsteadiness on feet  Rationale for Evaluation and Treatment Rehabilitation  PERTINENT HISTORY: HTN, HLD, seizures, anaplastic astrocytoma (x 2 years)  PRECAUTIONS: Fall and Other: to start again radiation, chemo  SUBJECTIVE: Husband reports that the patient fell on Saturday  when getting up from the table. Reports a couple bruises but reports that she was not in any pain. Bruise and scrape visible on posterior R arm and above R iliac crest.  PAIN:  Are you having pain? No   OBJECTIVE:   TODAY'S TREATMENT: 09/24/21 Activity Comments  Bike L1 x 5 min Performing independently at good pace   5xSTS 14.06 sec with L hand on knee; cueing and practicing STS before testing to prevent retropulsion   STS 2x5 Cues to scoot feet back  mini squat 10x Holding with L UE  TKE yellow TB R LE with feet staggered 10x each Verbal cues for "bend, straighten"  4"step up with R LE 10x CGA; catching toes on step d/t hip flexor weakness  Marching with L UE support  Trialed with 2# ankle wts, then 1# on R LE d/t difficulty; requiring manual cueing for increased R hip flexion   fwd/back stepping   side step over 1/2 foam roll 10x Cues for "big step" particularly towards R and needing occasional physical assist  Sidestepping, anterior monster walks with yellow loop above knees  Good ability to perform large side steps, trouble performing monster walk without assist     HEP as of 09/19/21: Access Code: [email or web address] URL: [email or web address] Date: 09/19/2021 Prepared by: [facility]  Exercises - Seated Gluteal Sets  - 2-3 x daily - 7 x weekly - 2 sets - 10 reps - 3 sec hold -  Sit to stand in stride stance  - 2 x daily - 7 x weekly - 2 sets - 10 reps - Seated Hamstring Stretch  - 2-3 x daily - 7 x weekly - 1 sets - 3 reps - 10-15 sec hold - Standing Gastroc Stretch on Step  - 1 x daily - 7 x weekly - 1 sets - 3 reps - 10-15 sec hold - Seated Gastroc Stretch with Strap  - 2-3 x daily - 7 x weekly - 1 sets - 3 reps - 10-15 sec hold - Mini Squat with Counter Support  - 1 x daily - 5 x weekly - 2 sets - 10 reps - Single Leg Bridge  - 1 x daily - 5 x weekly - 2 sets - 10 reps - Side Stepping with Counter Support  - 1 x daily - 5 x weekly - 2  sets - 10 reps - Walking with Counter Support  - 1 x daily - 5 x weekly - 2 sets - 10 reps - Single Leg Stance with Support  - 1 x daily - 5 x weekly - 2 sets - 30 sec hold - Forward Step Up  - 1 x daily - 5 x weekly - 2 sets - 10 reps  GOALS: Goals reviewed with patient? Yes   SHORT TERM GOALS: Target date: 09/25/2021   Pt will be supervision with HEP for improved strength, balance, gait. Baseline: Goal status: MET 09/12/21   2.  Pt will improve 5x sit<>stand to less than or equal to 13 sec to demonstrate improved functional strength and transfer efficiency. Baseline: 16.19 sec Goal status: IN PROGRESS; 14.06 sec with L hand on knee 09/24/21   3.  Pt will demo safe use of appropriate orthotic and assistive device for improved safety with functional mobility and gait. Baseline: Trial of foot-up brace in session; family plans to order Goal status: MET 09/24/21; using foot up brace consistently      LONG TERM GOALS: Target date: 10/23/2021   Pt will be supervision with HEP for improved strength, balance, transfers, and gait. Baseline:  Goal status:IN PROGRESS   2.  Pt will improve 5x sit<>stand to less than or equal to 11.5 sec to demonstrate improved functional strength and transfer efficiency. Baseline:  Goal status: IN PROGRESS; 14.06 sec with L hand on knee 09/24/21   3.  Pt will improve TUG score to less than or equal to 13.5 sec for decreased fall risk. Baseline: 14.84 sec Goal status: IN PROGRESS   4.  Pt will improve gait velocity to at least 2.62 ft/sec for improved gait efficiency and safety. Baseline: 2.47 ft/sec Goal status: IN PROGRESS     ASSESSMENT:   CLINICAL IMPRESSION: Patient without new complaints at start of session. Husband reports that the patient fell on Saturday but denies injury besides a few bruises, which were visible today. 5xSTS today was completed about 2 sec quicker than initial eval. Review and practice of proper form with STS required before  testing d/t tendency for retropulsion. Standing strengthening activities required intermittent rest breaks d/t R LE quick to fatigue. Most limitation today was evident in R hip flexor, making clearing steps/thresholds difficult. Patient tolerated session well and without complaints upon leaving.    OBJECTIVE IMPAIRMENTS Abnormal gait, decreased balance, decreased mobility, difficulty walking, decreased strength, decreased safety awareness, impaired flexibility, and impaired tone.    ACTIVITY LIMITATIONS standing, squatting, stairs, transfers, and locomotion level   PARTICIPATION LIMITATIONS: shopping, community activity, and occupation  PERSONAL FACTORS HTN, HLD, seizures, anaplastic astrocytoma are also affecting patient's functional outcome.    REHAB POTENTIAL: Good   CLINICAL DECISION MAKING: Unstable/unpredictable   EVALUATION COMPLEXITY: High   PLAN: PT FREQUENCY: 2x/week   PT DURATION: 8 weeks   PLANNED INTERVENTIONS: Therapeutic exercises, Therapeutic activity, Neuromuscular re-education, Balance training, Gait training, Patient/Family education, Joint mobilization, Orthotic/Fit training, DME instructions, and Manual therapy   PLAN FOR NEXT SESSION: Review updates to HEP; further work on RLE strengthening, standing balance.  Gait training with foot-up brace.  May want to include supine hip and LLE strengthening, standing balance.  Monitor pt's activity tolerance due to starting radiation on 08/29/21.     Janene Harvey, PT, DPT 09/24/21 12:42 PM  [facility] at [facility] [address] [place], [place], [place] [address] Phone # [phone] Fax # [phone]

## 2021-09-24 ENCOUNTER — Ambulatory Visit: Payer: 59 | Admitting: Occupational Therapy

## 2021-09-24 ENCOUNTER — Ambulatory Visit: Payer: 59 | Admitting: Physical Therapy

## 2021-09-24 ENCOUNTER — Encounter: Payer: Self-pay | Admitting: Occupational Therapy

## 2021-09-24 ENCOUNTER — Encounter: Payer: Self-pay | Admitting: Physical Therapy

## 2021-09-24 DIAGNOSIS — R482 Apraxia: Secondary | ICD-10-CM | POA: Diagnosis not present

## 2021-09-24 DIAGNOSIS — R278 Other lack of coordination: Secondary | ICD-10-CM | POA: Diagnosis not present

## 2021-09-24 DIAGNOSIS — M6281 Muscle weakness (generalized): Secondary | ICD-10-CM

## 2021-09-24 DIAGNOSIS — R2681 Unsteadiness on feet: Secondary | ICD-10-CM | POA: Diagnosis not present

## 2021-09-24 DIAGNOSIS — R4701 Aphasia: Secondary | ICD-10-CM | POA: Diagnosis not present

## 2021-09-24 DIAGNOSIS — R41840 Attention and concentration deficit: Secondary | ICD-10-CM | POA: Diagnosis not present

## 2021-09-24 DIAGNOSIS — I69851 Hemiplegia and hemiparesis following other cerebrovascular disease affecting right dominant side: Secondary | ICD-10-CM

## 2021-09-24 DIAGNOSIS — R2689 Other abnormalities of gait and mobility: Secondary | ICD-10-CM

## 2021-09-24 NOTE — Therapy (Signed)
OUTPATIENT OCCUPATIONAL THERAPY TREATMENT NOTE  Patient Name: Kristen Foley MRN: [id number] DOB:March 21, 1966, 56 y.o., female Today's Date: 09/24/2021  PCP: Unk Pinto, MD REFERRING PROVIDER: Holzmacher, [place]ia [place], NP    OT End of Session - 09/24/21 1236     Visit Number 7    Number of Visits 17    Date for OT Re-Evaluation 10/26/21    Authorization Type Aetna    OT Start Time 1105    OT Stop Time 1145    OT Time Calculation (min) 40 min    Activity Tolerance Patient tolerated treatment well    Behavior During Therapy [facility] for tasks assessed/performed                 Past Medical History:  Diagnosis Date   Allergy    Anemia    Past Surgical History:  Procedure Laterality Date   [place]   Patient Active Problem List   Diagnosis Date Noted   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   Expressive aphasia 11/26/2019   Seizures ([place]) 05/31/2019   Hypoalbuminemia 05/31/2019   Hypomagnesemia    Numbness and tingling of right leg 05/30/2019   Speech disturbance 05/30/2019   Transient alteration of awareness 05/30/2019   Fatigue 03/18/2018   FH: hypertension 03/18/2018   Abnormal glucose 03/18/2018   Hyperlipidemia, mixed 03/18/2018   Elevated BP without diagnosis of hypertension 03/18/2018   Vitamin D deficiency 03/18/2018    ONSET DATE: symptoms beginning about 4-6 weeks ago, referral date 08/23/21  REFERRING DIAG: C71.9 (ICD-10-CM) - Malignant neoplasm of brain, unspecified   THERAPY DIAG:  Muscle weakness (generalized)  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 No  PATIENT GOALS to be independent   OBJECTIVE:   TODAY'S TREAMENT: Neuromuscular reeducation with emphasis on postural alignment to foster active movement in RUE.  Patient with impaired sensation in RUE, and decreased motor planning, however, when weight biased to midline or even slightly to right, and RUE placed outside of base of support, able to slide right hand on surface toward body activating shoulder adduction, then slight flexion and extension. Difficult for patient to reproduce movement multiple times, but active movement and muscle tension felt with increased demand.   Worked on unstable moveable surface to increase task demand to give external feedback to movement in arm.   Did not feel muscle tension change with attempts for long arm weight bearing or even forearm weight     bearing but consistently felt increased tension and movement with weight biased toward right hip in sitting. Patient also with less restriction to glenohumeral motion.   Patient's husband indicates that he understands the exercises he has been shown to provide range of motion and prevent joint tightness, but he admits to not doing these exercises regularly as there is so much to do.  He did indicate that patient dressed herself  today minus socks and shoes.   Patient aphasic but responded well with directional cues and facial expressions.        PATIENT EDUCATION: Provided condition-specific education, particularly regarding positioning and support of RUE for decreasing risk of further injury and increased protection, hypertonicity and spasticity, neuromuscular reeducation Person educated: Patient and  Spouse Education method: Explanation and Handouts Education comprehension: verbalized understanding and needs further education   HOME EXERCISE PROGRAM: Provided with Self-ROM exercises and handout  Medbridge Code: [place]    GOALS: Goals reviewed with patient? Yes  SHORT TERM GOALS: Target date: 09/28/21  Pt will be independent with HEP to address RUE ROM. Baseline: Goal status: IN PROGRESS  2.  Pt will be able to demonstrate increased awareness of positioning of RUE during functional tasks and ability to compensate for decreased sensation/proprioception. Baseline:  Goal status: IN PROGRESS  3.  Pt will be able to utilize dominant RUE as gross assist during self-care tasks. Baseline:  Goal status: IN PROGRESS  4.  Pt will be able to complete UB dressing with setup assist only. Baseline:  Goal status: IN PROGRESS   LONG TERM GOALS: Target date: 10/26/21  Pt and spouse will verbalize understanding of adapted strategies and utilized DME/AE as needed to maximize safety and I with ADLs/ IADLs . Baseline:  Goal status: IN PROGRESS  2.  Pt will be able to utilize dominant RUE at diminished level during self-care tasks. Baseline:  Goal status: IN PROGRESS  3.  Pt will be able to complete UB dressing Mod I. Baseline:  Goal status: IN PROGRESS  4.  Pt will be able to complete LB dressing with supervision/setup. Baseline:  Goal status: IN PROGRESS  5.  Pt will be able to grasp 1" block with RUE to demonstrate improved functional use of RUE to progress towards engagement in objective assessment of box and blocks. Baseline:  Goal status: IN PROGRESS   ASSESSMENT:  CLINICAL IMPRESSION: Patient with slight increase in muscle activation in right shoulder and elbow this session.  Did well on an unstable surface (ball) where she had to add tension to keep hand still, and even had some movement following directional cue - movement in more pattern of flexor synergy.    PERFORMANCE DEFICITS in functional skills including ADLs, IADLs, coordination, dexterity, proprioception, sensation, ROM, strength, pain, FMC, GMC, mobility, balance, body mechanics, endurance, decreased knowledge of precautions, decreased knowledge of use of DME, skin integrity, and UE functional use, cognitive skills including emotional, sequencing, thought, and understand, and psychosocial skills including environmental adaptation.   IMPAIRMENTS are limiting patient from ADLs, IADLs, and social participation.   COMORBIDITIES may have co-morbidities  that affects occupational performance. Patient will benefit from skilled OT to address above impairments and improve overall function.   PLAN: OT FREQUENCY: 2x/week  OT DURATION: 8 weeks  PLANNED INTERVENTIONS: self care/ADL training, therapeutic exercise, therapeutic activity, neuromuscular re-education, manual therapy, passive range of motion, balance training, functional mobility training, splinting, moist heat, cryotherapy, patient/family education, cognitive remediation/compensation, visual/perceptual remediation/compensation, psychosocial skills training, energy conservation, coping strategies training, and DME and/or AE instructions  RECOMMENDED OTHER SERVICES: SLP  CONSULTED AND AGREED WITH PLAN OF CARE: Patient and family member/caregiver  PLAN FOR NEXT SESSION:  Review HEP and begin additional PROM/AAROM exercises as appropriate, WB w/ significant shoulder support prn   Mariah Milling, OTR/L 09/24/2021, 12:51 PM

## 2021-09-25 NOTE — Therapy (Signed)
OUTPATIENT PHYSICAL THERAPY TREATMENT NOTE   Patient Name: Kristen Foley MRN: [id number] DOB:09-Feb-1966, 56 y.o., female Today's Date: 09/26/2021  PCP: Unk Pinto, MD REFERRING PROVIDER: Margarita Grizzle, NP  END OF SESSION:   PT End of Session - 09/26/21 1402     Visit Number 8    Number of Visits 17    Date for PT Re-Evaluation 10/23/21    Authorization Type Aetna    PT Start Time 1316    PT Stop Time 1359    PT Time Calculation (min) 43 min    Equipment Utilized During Treatment Gait belt    Activity Tolerance Patient tolerated treatment well    Behavior During Therapy WFL for tasks assessed/performed                  Past Medical History:  Diagnosis Date   Allergy    Anemia    Past Surgical History:  Procedure Laterality Date   [place]   Patient Active Problem List   Diagnosis Date Noted   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   Expressive aphasia 11/26/2019   Seizures ([place]) 05/31/2019   Hypoalbuminemia 05/31/2019   Hypomagnesemia    Numbness and tingling of right leg 05/30/2019   Speech disturbance 05/30/2019   Transient alteration of awareness 05/30/2019   Fatigue 03/18/2018   FH: hypertension 03/18/2018   Abnormal glucose 03/18/2018   Hyperlipidemia, mixed 03/18/2018   Elevated BP without diagnosis of hypertension 03/18/2018   Vitamin D deficiency 03/18/2018    REFERRING DIAG:  C71.9 (ICD-10-CM) - Malignant neoplasm of brain, unspecified   THERAPY DIAG:  Muscle weakness (generalized)  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  PAIN:  Are you having pain?  No   OBJECTIVE:     TODAY'S TREATMENT: 09/26/21 Activity Comments  Bike L1 x 1.5 min, L2x 1.5 min Tolerating increased resistance well  R SLR 2x5 Cues for quad set before lift with AAROM SLR; good ability to correct according to cues  quad set R LE 10x2" Best contraction when co-contracting opposite side  bridge with yellow TB 10x Good form  clam with yellow TB 2x10 Cues to avoid lifting bottom; heavy demonstration and manual/verbal cueing to encourage proper movement  SL hip ABD 10x each Manual assist for proper alignment; AAROM on R LE  Alt prone hip ex 2x5 each AAROM R LE  Alt prone HS curl 2x5 each AAROM R LE  Hooklying hip adduction into ball 10x2" Cueing to utilize R LE    PATIENT EDUCATION: Education details: discussion on HEP compliance- husband reports that HEP is not getting done d/t limited time as patient goes to work with him Person educated: Spouse Education method: Consulting civil engineer, Media planner, Corporate treasurer cues, Verbal cues, and Handouts Education comprehension: verbalized understanding and returned demonstration   HEP as of 09/19/21: Access Code: [email or web address] URL: [email or web address] Date: 09/19/2021 Prepared by: [facility]  Exercises - Seated Gluteal Sets  - 2-3 x daily - 7 x weekly - 2 sets - 10 reps - 3 sec hold - Sit to stand in stride stance  - 2 x daily - 7 x weekly - 2 sets - 10 reps - Seated  Hamstring Stretch  - 2-3 x daily - 7 x weekly - 1 sets - 3 reps - 10-15 sec hold - Standing Gastroc Stretch on Step  - 1 x daily - 7 x weekly - 1 sets - 3 reps - 10-15 sec hold - Seated Gastroc Stretch with Strap  - 2-3 x daily - 7 x weekly - 1 sets - 3 reps - 10-15 sec hold - Mini Squat with Counter Support  - 1 x daily - 5 x weekly - 2 sets - 10 reps - Single Leg Bridge  - 1 x daily - 5 x weekly - 2 sets - 10 reps - Side Stepping with Counter Support  - 1 x daily - 5 x weekly - 2 sets - 10 reps - Walking with Counter  Support  - 1 x daily - 5 x weekly - 2 sets - 10 reps - Single Leg Stance with Support  - 1 x daily - 5 x weekly - 2 sets - 30 sec hold - Forward Step Up  - 1 x daily - 5 x weekly - 2 sets - 10 reps  GOALS: Goals reviewed with patient? Yes   SHORT TERM GOALS: Target date: 09/25/2021   Pt will be supervision with HEP for improved strength, balance, gait. Baseline: Goal status: MET 09/12/21   2.  Pt will improve 5x sit<>stand to less than or equal to 13 sec to demonstrate improved functional strength and transfer efficiency. Baseline: 16.19 sec Goal status: IN PROGRESS; 14.06 sec with L hand on knee 09/24/21   3.  Pt will demo safe use of appropriate orthotic and assistive device for improved safety with functional mobility and gait. Baseline: Trial of foot-up brace in session; family plans to order Goal status: MET 09/24/21; using foot up brace consistently      LONG TERM GOALS: Target date: 10/23/2021   Pt will be supervision with HEP for improved strength, balance, transfers, and gait. Baseline:  Goal status:IN PROGRESS   2.  Pt will improve 5x sit<>stand to less than or equal to 11.5 sec to demonstrate improved functional strength and transfer efficiency. Baseline:  Goal status: IN PROGRESS; 14.06 sec with L hand on knee 09/24/21   3.  Pt will improve TUG score to less than or equal to 13.5 sec for decreased fall risk. Baseline: 14.84 sec Goal status: IN PROGRESS   4.  Pt will improve gait velocity to at least 2.62 ft/sec for improved gait efficiency and safety. Baseline: 2.47 ft/sec Goal status: IN PROGRESS     ASSESSMENT:   CLINICAL IMPRESSION: Patient arrived to session without new complaints. Worked on mat LE strengthening to assist in eliciting stronger muscle contraction in the R LE. Patient demonstrated good improvement in form with assisted SLRs after verbal cueing. Demonstrated good ability to perform hip abduction on hemiparetic side despite difficulty.  More  difficulty using HS and glutes in prone today. Patient tolerated session without complaints today.    OBJECTIVE IMPAIRMENTS Abnormal gait, decreased balance, decreased mobility, difficulty walking, decreased strength, decreased safety awareness, impaired flexibility, and impaired tone.    ACTIVITY LIMITATIONS standing, squatting, stairs, transfers, and locomotion level   PARTICIPATION LIMITATIONS: shopping, community activity, and occupation   PERSONAL FACTORS HTN, HLD, seizures, anaplastic astrocytoma are also affecting patient's functional outcome.    REHAB POTENTIAL: Good   CLINICAL DECISION MAKING: Unstable/unpredictable   EVALUATION COMPLEXITY: High   PLAN: PT FREQUENCY: 2x/week   PT DURATION: 8 weeks   PLANNED INTERVENTIONS:  Therapeutic exercises, Therapeutic activity, Neuromuscular re-education, Balance training, Gait training, Patient/Family education, Joint mobilization, Orthotic/Fit training, DME instructions, and Manual therapy   PLAN FOR NEXT SESSION: Review updates to HEP; further work on RLE strengthening, standing balance.  Gait training with foot-up brace.  May want to include supine hip and LLE strengthening, standing balance.  Monitor pt's activity tolerance due to starting radiation on 08/29/21.     Janene Harvey, PT, DPT 09/26/21 2:03 PM  [facility] [address] [place], [place], [place] [address] Phone # [phone] Fax # [phone]

## 2021-09-26 ENCOUNTER — Ambulatory Visit: Payer: 59 | Admitting: Physical Therapy

## 2021-09-26 ENCOUNTER — Encounter: Payer: Self-pay | Admitting: Physical Therapy

## 2021-09-26 ENCOUNTER — Ambulatory Visit: Payer: 59 | Admitting: Occupational Therapy

## 2021-09-26 ENCOUNTER — Encounter: Payer: Self-pay | Admitting: Occupational Therapy

## 2021-09-26 DIAGNOSIS — R2689 Other abnormalities of gait and mobility: Secondary | ICD-10-CM | POA: Diagnosis not present

## 2021-09-26 DIAGNOSIS — M6281 Muscle weakness (generalized): Secondary | ICD-10-CM | POA: Diagnosis not present

## 2021-09-26 DIAGNOSIS — R2681 Unsteadiness on feet: Secondary | ICD-10-CM | POA: Diagnosis not present

## 2021-09-26 DIAGNOSIS — R4701 Aphasia: Secondary | ICD-10-CM | POA: Diagnosis not present

## 2021-09-26 DIAGNOSIS — I69851 Hemiplegia and hemiparesis following other cerebrovascular disease affecting right dominant side: Secondary | ICD-10-CM

## 2021-09-26 DIAGNOSIS — R41840 Attention and concentration deficit: Secondary | ICD-10-CM | POA: Diagnosis not present

## 2021-09-26 DIAGNOSIS — R482 Apraxia: Secondary | ICD-10-CM | POA: Diagnosis not present

## 2021-09-26 DIAGNOSIS — R278 Other lack of coordination: Secondary | ICD-10-CM | POA: Diagnosis not present

## 2021-09-26 NOTE — Therapy (Signed)
OUTPATIENT OCCUPATIONAL THERAPY TREATMENT NOTE  Patient Name: Kristen Foley MRN: [id number] DOB:09-26-1965, 56 y.o., female Today's Date: 09/26/2021  PCP: Lucky Cowboy, MD REFERRING PROVIDER: Holzmacher, Alain [place], NP    OT End of Session - 09/26/21 1409     Visit Number 8    Number of Visits 17    Date for OT Re-Evaluation 10/26/21    Authorization Type Aetna    OT Start Time 1404    OT Stop Time 1445    OT Time Calculation (min) 41 min    Activity Tolerance Patient tolerated treatment well    Behavior During Therapy [facility] for tasks assessed/performed            Past Medical History:  Diagnosis Date   Allergy    Anemia    Past Surgical History:  Procedure Laterality Date   MANDIBLE RECONSTRUCTION  1986   Patient Active Problem List   Diagnosis Date Noted   Anaplastic astrocytoma (HCC) 12/31/2019   Expressive aphasia 11/26/2019   Seizures (HCC) 05/31/2019   Hypoalbuminemia 05/31/2019   Hypomagnesemia    Numbness and tingling of right leg 05/30/2019   Speech disturbance 05/30/2019   Transient alteration of awareness 05/30/2019   Fatigue 03/18/2018   FH: hypertension 03/18/2018   Abnormal glucose 03/18/2018   Hyperlipidemia, mixed 03/18/2018   Elevated BP without diagnosis of hypertension 03/18/2018   Vitamin D deficiency 03/18/2018    ONSET DATE: symptoms beginning around mid-April 2023, referral date 08/23/21  REFERRING DIAG: C71.9 (ICD-10-CM) - Malignant neoplasm of brain, unspecified   THERAPY DIAG:  Muscle weakness (generalized)  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)  Other lack of coordination  Attention and concentration deficit  Other abnormalities of gait and mobility  Rationale for Evaluation and Treatment Rehabilitation  SUBJECTIVE:   SUBJECTIVE STATEMENT: Pt's spouse reports they are having difficulty managing everything and may look into some type of care assistance  at home Pt accompanied by: significant other (husband, Cristal Deer)  PERTINENT HISTORY: labile HTN, HLD, Prediabetes and Vitamin D Deficiency who was 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 No  PATIENT GOALS to be independent   OBJECTIVE:   TODAY'S TREAMENT: Neuromuscular reeducation focusing on active shoulder movement in RUE. Patient with impaired sensation in RUE and additional limitations due to aphasia and decreased motor planning, however, when weight biased to midline or even slightly to right, and RUE placed in gravity minimized positioning on small 10" ball, pt was able to activate shoulder adduction, internal rotation, and slight flexion and extension; no abduction or external rotation noted. Limited consistency w/ each movement, but ultimately able to achieve activation of internal rotation/adduction slightly against gravity. Worked on unstable moveable surface to increase task demand to give external feedback to movement in arm. OT also attempted isometric contraction to maintain RUE position at 90 degrees of elbow flexion while in supine position w/ towel roll under UE for scapular alignment. Unable to achieve enough activation for maintaining of positioning and difficult to determine if tightness palpated anteriorly in axillary region was due to attempted activation or potential increased hypertonicity.    PATIENT EDUCATION: Ongoing condition-specific education Person educated: Patient and Spouse Education method: Explanation Education comprehension: verbalized understanding and needs further education   HOME EXERCISE PROGRAM: Provided with Self-ROM exercises and handout  Medbridge Code: [email or web address]  GOALS: Goals reviewed with patient? Yes  SHORT TERM GOALS: Target date: 09/28/21  Pt will be independent with HEP to address RUE  ROM. Baseline: Goal status: IN PROGRESS  2.  Pt will be able to demonstrate increased awareness of positioning of RUE during functional tasks and ability to compensate for decreased sensation/proprioception. Baseline:  Goal status: IN PROGRESS  3.  Pt will be able to utilize dominant RUE as gross assist during self-care tasks. Baseline:  Goal status: IN PROGRESS  4.  Pt will be able to complete UB dressing with setup assist only. Baseline:  Goal status: IN PROGRESS   LONG TERM GOALS: Target date: 10/26/21  Pt and spouse will verbalize understanding of adapted strategies and utilized DME/AE as needed to maximize safety and I with ADLs/ IADLs . Baseline:  Goal status: IN PROGRESS  2.  Pt will be able to utilize dominant RUE at diminished level during self-care tasks. Baseline:  Goal status: IN PROGRESS  3.  Pt will be able to complete UB dressing Mod I. Baseline:  Goal status: IN PROGRESS  4.  Pt will be able to complete LB dressing with supervision/setup. Baseline:  Goal status: IN PROGRESS  5.  Pt will be able to grasp 1" block with RUE to demonstrate improved functional use of RUE to progress towards engagement in objective assessment of box and blocks. Baseline:  Goal status: IN PROGRESS   ASSESSMENT:  CLINICAL IMPRESSION: Pt with continued success on unstable surface (ball) for activation of R shoulder this session. OT incorporated a variety of direction/cues to facilitate success, adjusting positioning or support of RUE prn for increased muscle activation. Most success w/ IR and adduction and positive results w/ increased demand and repetition. Some resistance to passive movement of shoulder external rotation palpated, but unclear if this is more increased tone or difficulty w/ motor planning. Patient aphasic but responded well with repetition and gestural and directional cues.  PERFORMANCE DEFICITS in functional skills including ADLs, IADLs, coordination, dexterity,  proprioception, sensation, ROM, strength, pain, FMC, GMC, mobility, balance, body mechanics, endurance, decreased knowledge of precautions, decreased knowledge of use of DME, skin integrity, and UE functional use, cognitive skills including emotional, sequencing, thought, and understand, and psychosocial skills including environmental adaptation.   IMPAIRMENTS are limiting patient from ADLs, IADLs, and social participation.   COMORBIDITIES may have co-morbidities  that affects occupational performance. Patient will benefit from skilled OT to address above impairments and improve overall function.   PLAN: OT FREQUENCY: 2x/week  OT DURATION: 8 weeks  PLANNED INTERVENTIONS: self care/ADL training, therapeutic exercise, therapeutic activity, neuromuscular re-education, manual therapy, passive range of motion, balance training, functional mobility training, splinting, moist heat, cryotherapy, patient/family education, cognitive remediation/compensation, visual/perceptual remediation/compensation, psychosocial skills training, energy conservation, coping strategies training, and DME and/or AE instructions  RECOMMENDED OTHER SERVICES: SLP  CONSULTED AND AGREED WITH PLAN OF CARE: Patient and family member/caregiver  PLAN FOR NEXT SESSION:  Review HEP and begin additional PROM/AAROM exercises as appropriate, WB w/ significant shoulder support prn   Rosie Fate, OTR/L 09/26/2021, 3:11 PM

## 2021-10-01 ENCOUNTER — Ambulatory Visit: Payer: 59 | Admitting: Physical Therapy

## 2021-10-01 ENCOUNTER — Encounter: Payer: Self-pay | Admitting: Physical Therapy

## 2021-10-01 ENCOUNTER — Ambulatory Visit: Payer: 59 | Admitting: Occupational Therapy

## 2021-10-01 DIAGNOSIS — R278 Other lack of coordination: Secondary | ICD-10-CM | POA: Diagnosis not present

## 2021-10-01 DIAGNOSIS — R482 Apraxia: Secondary | ICD-10-CM | POA: Diagnosis not present

## 2021-10-01 DIAGNOSIS — I69851 Hemiplegia and hemiparesis following other cerebrovascular disease affecting right dominant side: Secondary | ICD-10-CM | POA: Diagnosis not present

## 2021-10-01 DIAGNOSIS — R41840 Attention and concentration deficit: Secondary | ICD-10-CM | POA: Diagnosis not present

## 2021-10-01 DIAGNOSIS — R2681 Unsteadiness on feet: Secondary | ICD-10-CM

## 2021-10-01 DIAGNOSIS — R2689 Other abnormalities of gait and mobility: Secondary | ICD-10-CM

## 2021-10-01 DIAGNOSIS — M6281 Muscle weakness (generalized): Secondary | ICD-10-CM

## 2021-10-01 DIAGNOSIS — R4701 Aphasia: Secondary | ICD-10-CM | POA: Diagnosis not present

## 2021-10-01 NOTE — Therapy (Signed)
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   Expressive aphasia 11/26/2019   Seizures (HCC) 05/31/2019   Hypoalbuminemia 05/31/2019   Hypomagnesemia    Numbness and tingling of right leg 05/30/2019   Speech disturbance 05/30/2019   Transient alteration of awareness 05/30/2019   Fatigue 03/18/2018   FH: hypertension 03/18/2018   Abnormal glucose 03/18/2018   Hyperlipidemia, mixed 03/18/2018   Elevated BP without diagnosis of hypertension 03/18/2018   Vitamin D deficiency 03/18/2018    ONSET DATE: symptoms beginning around mid-April 2023, referral date 08/23/21  REFERRING DIAG: C71.9 (ICD-10-CM) - Malignant neoplasm of brain, unspecified   THERAPY DIAG:  No diagnosis found.  Rationale for Evaluation and Treatment Rehabilitation  SUBJECTIVE:   SUBJECTIVE STATEMENT: Pt's spouse reports that her speech is really frustrating her. Pt accompanied by: significant other (husband, Cristal Deer)  PERTINENT HISTORY: labile HTN, HLD, Prediabetes and Vitamin D Deficiency who was dx'd with an anaplastic astrocytoma in Feb 2021 and is followed at [facility] with ongoing bimonthly chemo    PRECAUTIONS: Fall  PAIN:  Are you having pain? No  PATIENT GOALS to be independent   OBJECTIVE:   TODAY'S TREAMENT: NMR with focus on RUE active shoulder movement.  Pt demonstrating internal rotation, external rotation, and adduction when rolling small 10" ball on leg.  Pt continues to utilize trunk movements to compensate for decreased activation.  Therapist  providing intermittent cues at trunk to minimize compensatory movements while still allowing moderate movements to facilitate increased activation.  Pt demonstrating ability to "stop" movement when moving in abduction to control ball and not allow if to fall off leg.   NMR with large therapy ball in sitting with focus on shoulder flexion, extension, and abduction/adduction.  Utilizing hand over hand to facilitate activation and AAROM.  Pt able to elicit trace activation on large therapy ball with repetition but unable to replicate consistently. Engaged in WB through forearm and elbow, placed on therapist's thigh, to modify positioning and allow for grading of WB.  Pt requiring increased facilitation and tactile cues at trunk for weight shifting and WB through RUE during reaching activity.  Pt able to reach for rings across midline with LUE to further facilitate WB through RUE and then sit up to place on L side.  Therapist providing cues, demonstration, and tactile cues for facilitation of task.     PATIENT EDUCATION: Ongoing condition-specific education Person educated: Patient and Spouse Education method: Explanation Education comprehension: verbalized understanding and needs further education   HOME EXERCISE PROGRAM: Provided with Self-ROM exercises and handout  Medbridge Code: [id number]    GOALS: Goals reviewed with patient? Yes  SHORT TERM GOALS: Target date: 09/28/21  Pt will be independent with HEP to address RUE ROM. Baseline: Goal status: MET  2.  Pt will be able to demonstrate increased awareness of positioning of RUE during functional tasks and ability to compensate for decreased sensation/proprioception. Baseline:  Goal status: MET  3.  Pt will be able to utilize dominant RUE as gross assist during self-care tasks. Baseline:  Goal status: IN PROGRESS  4.  Pt will be able to complete UB dressing with setup assist only. Baseline:  Goal status: PARTIALLY MET - Pt is able  to don pull over shirt without assistance, does still require assistance with donning bra   LONG TERM GOALS: Target date: 10/26/21  Pt and spouse will verbalize understanding of adapted strategies and utilized DME/AE as needed to maximize safety and I with ADLs/ IADLs . Baseline:  Goal status: IN PROGRESS  2.  Pt will be able to utilize dominant RUE at diminished level during self-care tasks. Baseline:  Goal status: IN PROGRESS  3.  Pt will be able to complete UB dressing Mod I. Baseline:  Goal status: IN PROGRESS  4.  Pt will be able to complete LB dressing with supervision/setup. Baseline:  Goal status: IN PROGRESS  5.  Pt will be able to grasp 1" block with RUE to demonstrate improved functional use of RUE to progress towards engagement in objective assessment of box and blocks. Baseline:  Goal status: IN PROGRESS   ASSESSMENT:  CLINICAL IMPRESSION: Pt continues to demonstrate success on unstable surface (ball on leg and large therapy ball) for activation of R shoulder.  Therapist incorporated a variety of cues, multimodal and demonstration being most effective to increase participation and success.  Pt demonstrating good awareness of RLE with setup for tasks, however would require physical assistance to adjust RUE intermittently for increased muscle activation.  Pt tolerated WB through forearm and elbow while positioned on therapist's leg to allow for modification of positioning and WB.  Patient aphasic but responded well with repetition and gestural and directional cues.  PERFORMANCE DEFICITS in functional skills including ADLs, IADLs, coordination, dexterity, proprioception, sensation, ROM, strength, pain, FMC, GMC, mobility, balance, body mechanics, endurance, decreased knowledge of precautions, decreased knowledge of use of DME, skin integrity, and UE functional use, cognitive skills including emotional, sequencing, thought, and understand, and psychosocial skills including  environmental adaptation.   IMPAIRMENTS are limiting patient from ADLs, IADLs, and social participation.   COMORBIDITIES may have co-morbidities  that affects occupational performance. Patient will benefit from skilled OT to address above impairments and improve overall function.   PLAN: OT FREQUENCY: 2x/week  OT DURATION: 8 weeks  PLANNED INTERVENTIONS: self care/ADL training, therapeutic exercise, therapeutic activity, neuromuscular re-education, manual therapy, passive range of motion, balance training, functional mobility training, splinting, moist heat, cryotherapy, patient/family education, cognitive remediation/compensation, visual/perceptual remediation/compensation, psychosocial skills training, energy conservation, coping strategies training, and DME and/or AE instructions  RECOMMENDED OTHER SERVICES: SLP  CONSULTED AND AGREED WITH PLAN OF CARE: Patient and family member/caregiver  PLAN FOR NEXT SESSION:   Review HEP and begin additional PROM/AAROM exercises as appropriate, WB w/ significant shoulder support prn   Nida Manfredi, OTR/L 10/01/2021, 3:11 PM

## 2021-10-02 ENCOUNTER — Ambulatory Visit: Payer: 59

## 2021-10-02 DIAGNOSIS — I69851 Hemiplegia and hemiparesis following other cerebrovascular disease affecting right dominant side: Secondary | ICD-10-CM | POA: Diagnosis not present

## 2021-10-02 DIAGNOSIS — R2681 Unsteadiness on feet: Secondary | ICD-10-CM | POA: Diagnosis not present

## 2021-10-02 DIAGNOSIS — M6281 Muscle weakness (generalized): Secondary | ICD-10-CM | POA: Diagnosis not present

## 2021-10-02 DIAGNOSIS — R41840 Attention and concentration deficit: Secondary | ICD-10-CM | POA: Diagnosis not present

## 2021-10-02 DIAGNOSIS — R2689 Other abnormalities of gait and mobility: Secondary | ICD-10-CM | POA: Diagnosis not present

## 2021-10-02 DIAGNOSIS — R278 Other lack of coordination: Secondary | ICD-10-CM | POA: Diagnosis not present

## 2021-10-02 DIAGNOSIS — R4701 Aphasia: Secondary | ICD-10-CM | POA: Diagnosis not present

## 2021-10-02 DIAGNOSIS — R482 Apraxia: Secondary | ICD-10-CM

## 2021-10-02 NOTE — Therapy (Signed)
OUTPATIENT SPEECH LANGUAGE PATHOLOGY APHASIA EVALUATION   Patient Name: Kristen Foley MRN: [id number] DOB:02/02/1966, 56 y.o., female Today's Date: 10/02/2021  PCP: Unk Pinto MD REFERRING PROVIDER: Holzmacher, Mia [place], NP   End of Session - 10/02/21 1156     Visit Number 2    Number of Visits 17    Date for SLP Re-Evaluation 11/19/21    SLP Start Time 1152    SLP Stop Time  1232    SLP Time Calculation (min) 40 min    Activity Tolerance Patient tolerated treatment well;Patient limited by fatigue             Past Medical History:  Diagnosis Date   Allergy    Anemia    Past Surgical History:  Procedure Laterality Date   [place]   Patient Active Problem List   Diagnosis Date Noted   Anaplastic astrocytoma ([place]) 12/31/2019   Expressive aphasia 11/26/2019   Seizures ([place]) 05/31/2019   Hypoalbuminemia 05/31/2019   Hypomagnesemia    Numbness and tingling of right leg 05/30/2019   Speech disturbance 05/30/2019   Transient alteration of awareness 05/30/2019   Fatigue 03/18/2018   FH: hypertension 03/18/2018   Abnormal glucose 03/18/2018   Hyperlipidemia, mixed 03/18/2018   Elevated BP without diagnosis of hypertension 03/18/2018   Vitamin D deficiency 03/18/2018    ONSET DATE: symptoms beginning about 4-6 weeks ago, referral date 08/23/21   REFERRING DIAG: 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 I do it."  (Pt)  [name] states one of pt's perseverative words is "do".   Pt accompanied by: significant other, Chris  PERTINENT HISTORY: labile HTN, HLD, Prediabetes  and Vitamin D Deficiency who was dx'd with an anaplastic Astrocytoma in Feb 2021 and is followed at [facility] with ongoing bimonthly chemo     PAIN:  Are you having pain? No  FALLS: Has patient  fallen in last 6 months?  Number of falls: 3  LIVING ENVIRONMENT: Lives with: lives with their family Lives in: House/apartment  PLOF:  Level of assistance: Independent with ADLs, Needed assistance with IADLS Employment: Full-time employment   PATIENT GOALS: Increased communication of needs and wants, communication strategies for pt and communication partners  OBJECTIVE:   Today's Treatment: 10/02/21: SLP trialed pt with low-tech AAC given discussion [name] and SLP had about situation that can be frustrating for both Kristen Foley and [name] - driving and suddenly Kristen Foley points excitedly/frantically for [place] to go somewhere. SLP assisted pt/husband provide choices for words for this AAC page and [name] to add more options to this page and return next session with it. He will also note more of these kind of situations for SLP to assist in construction of more AAC pages. READING COMPREHENSION: Impaired: word - pt took extra time to read single word responses in low-tech AAC "[place], [place], [facility], [place]" when presented with question (e.g., "you go here to get money"). [name] also showed SLP app mother is using with pt which constructs spoken sentences using text to speech and predictive text. SLP believes pt's word recognition is too slow at this time to use this app. Symbols on this app are abstract and not intuitive. Pt's speech/language today was perseverative in nature and without the correct context.   PATIENT REPORTED OUTCOME MEASURES (PROM): Did not complete due to severity of aphasia.  PATIENT EDUCATION: Education details: See today's treatment Person educated: Patient and Spouse Education method: Explanation, Demonstration, and Handouts Education comprehension: verbalized understanding, verbal cues required, and needs further education   GOALS: Goals reviewed with patient? Yes  SHORT TERM GOALS: Target date: 10/18/2021    Pt and husband will demonstrate use of supported communication  strategies in 5 min conversation to assist in decrease of communication breakdowns.  Baseline: Goal status: Ongoing  2.  Pt will follow 1-step directions with modA verbal cues.  Baseline:  Goal status: Ongoing  3.  Pt and husband will complete salient vocabulary list for HEP practice.  Baseline:  Goal status:Ongoing  4.  Pt will identify pictures with 90% accuracy from F06+ AAC board to increase ability to communicate wants/needs with communication partner.  Baseline:  Goal status: Ongoing   LONG TERM GOALS: Target date: 11/15/2021    Pt/husband will provide 1-2 examples of successful use of supported communication strategies at home.  Baseline:  Goal status: Ongoing  2.  Pt will follow 1-step directions given minA verbal cues. Baseline:  Goal status: Ongoing  3.  Pt/husband will provide 1-2 examples of successful use of low/high tech AAC at home. Baseline:  Goal status: Ongoing  4.  Pt will answer simple yes/no questions with 80% acc given visual/orthographic cue.  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 Pt husband was primary informant this session due to the nature of pt's aphasia/apraxia. SLP assisted husband and pt develop a low tech AAC board for situation when pt/husband are driving and pt wants to communiate place/s that have to be visited. SLP rec cont'd skilled ST services to address strategies for both pt and main communication partner as well as, identify possible means for alternative and/or augmentative communication.    OBJECTIVE IMPAIRMENTS include aphasia and apraxia. These impairments are limiting patient from ADLs/IADLs and effectively communicating at home and in community. Factors affecting potential to achieve goals and functional outcome are medical prognosis.  Patient will benefit from skilled SLP services to address above impairments and improve overall function.  REHAB POTENTIAL: Fair due to medical prognosis  PLAN: SLP FREQUENCY: 2x/week  SLP DURATION: 8 weeks  PLANNED INTERVENTIONS: Language facilitation, Environmental controls, Cueing hierachy, Internal/external aids, Functional tasks, Multimodal communication approach, SLP instruction and feedback, Compensatory strategies, and Patient/family education    [facility], CCC-SLP 10/02/2021, 11:57 AM

## 2021-10-03 ENCOUNTER — Telehealth: Payer: Self-pay | Admitting: *Deleted

## 2021-10-03 ENCOUNTER — Encounter: Payer: 59 | Admitting: Occupational Therapy

## 2021-10-03 ENCOUNTER — Ambulatory Visit: Payer: 59

## 2021-10-03 DIAGNOSIS — C719 Malignant neoplasm of brain, unspecified: Secondary | ICD-10-CM | POA: Diagnosis not present

## 2021-10-03 NOTE — Telephone Encounter (Signed)
   Telephone encounter was:  Successful.  10/03/2021 Name: EMINE LOPATA MRN: [id number] DOB: 23-Feb-1966  FREDA JAQUITH is a 56 y.o. year old female who is a primary care patient of Unk Pinto, MD . The community resource team was consulted for assistance with Caregiver Stress  Care guide performed the following interventions: Patient provided with information about care guide support team and interviewed to confirm resource needs Discussed resources to assist with caregiving .  Talked about resources with husband provided Caregiver resources and additional information , provided contact information if he has additional needs Follow Up Plan:  No further follow up planned at this time. The patient has been provided with needed resources.  [place], Care Management  [phone] [address] , [address] [address] Email : [email or web address]

## 2021-10-04 ENCOUNTER — Ambulatory Visit: Payer: 59

## 2021-10-04 ENCOUNTER — Ambulatory Visit: Payer: 59 | Admitting: Occupational Therapy

## 2021-10-04 DIAGNOSIS — R278 Other lack of coordination: Secondary | ICD-10-CM | POA: Diagnosis not present

## 2021-10-04 DIAGNOSIS — R4701 Aphasia: Secondary | ICD-10-CM | POA: Diagnosis not present

## 2021-10-04 DIAGNOSIS — R41840 Attention and concentration deficit: Secondary | ICD-10-CM | POA: Diagnosis not present

## 2021-10-04 DIAGNOSIS — R2681 Unsteadiness on feet: Secondary | ICD-10-CM | POA: Diagnosis not present

## 2021-10-04 DIAGNOSIS — R482 Apraxia: Secondary | ICD-10-CM

## 2021-10-04 DIAGNOSIS — I69851 Hemiplegia and hemiparesis following other cerebrovascular disease affecting right dominant side: Secondary | ICD-10-CM

## 2021-10-04 DIAGNOSIS — M6281 Muscle weakness (generalized): Secondary | ICD-10-CM | POA: Diagnosis not present

## 2021-10-04 DIAGNOSIS — R2689 Other abnormalities of gait and mobility: Secondary | ICD-10-CM | POA: Diagnosis not present

## 2021-10-04 NOTE — Therapy (Signed)
OUTPATIENT OCCUPATIONAL THERAPY TREATMENT NOTE  Patient Name: Kristen Foley MRN: [id number] DOB:04-Jun-1965, 55 y.o., female Today's Date: 10/04/2021  PCP: Unk Pinto, MD REFERRING PROVIDER: Holzmacher, Mia [place], NP    OT End of Session - 10/04/21 1430     Visit Number 10    Number of Visits 17    Date for OT Re-Evaluation 10/26/21    Authorization Type [employer]    OT Start Time 1151    OT Stop Time 1233    OT Time Calculation (min) 42 min    Activity Tolerance Patient tolerated treatment well    Behavior During Therapy [facility] for tasks assessed/performed             Past Medical History:  Diagnosis Date   Allergy    Anemia    Past Surgical History:  Procedure Laterality Date   [place]   Patient Active Problem List   Diagnosis Date Noted   Anaplastic astrocytoma ([place]) 12/31/2019   Expressive aphasia 11/26/2019   Seizures ([place]) 05/31/2019   Hypoalbuminemia 05/31/2019   Hypomagnesemia    Numbness and tingling of right leg 05/30/2019   Speech disturbance 05/30/2019   Transient alteration of awareness 05/30/2019   Fatigue 03/18/2018   FH: hypertension 03/18/2018   Abnormal glucose 03/18/2018   Hyperlipidemia, mixed 03/18/2018   Elevated BP without diagnosis of hypertension 03/18/2018   Vitamin D deficiency 03/18/2018    ONSET DATE: symptoms beginning around mid-April 2023, referral date 08/23/21  REFERRING DIAG: 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 No  PATIENT GOALS to be independent   OBJECTIVE:   TODAY'S TREAMENT: NMR with large therapy ball in sitting with focus on shoulder flexion, extension, and abduction/adduction.  Utilizing hand over hand to facilitate activation and AAROM.  Pt with no activation this session, therefore transitioned to large therapy ball placed on step to increase height of ball to more functional position to increase positioning and facilitate movement.  Pt visibly frustrated with attempts this session.   Transitioned to 10" ball on leg (as previous sessions) to regain engagement and motivation.  Pt demonstrating decreased motor control when compared to previous session, frequently dropping the ball and hand sliding off ball. Pt continues to utilize trunk movements to compensate for decreased activation. Engaged in WB through forearm and elbow, placed on therapist's thigh, to modify positioning and allow for grading of WB.  Pt requiring increased facilitation and tactile cues at shoulder girdle and through elbow to maintain WB through RUE during reaching activity.  Pt able to reach for rings across midline with LUE to further facilitate WB through RUE and then sit up to place on L side.  Therapist providing cues, demonstration, and tactile cues for facilitation of task.  Transitioned to WB through LUE on therapy mat with therapist positioned in front to further facilitate increased WB through  LUE and bicep activation to push self back into sitting at midline.  Therapist providing facilitation for positioning to ensure proper WB and then tactile and verbal cues to promote push up through arm vs trunk.  Pt continues to require use of trunk to push back up into  sitting due to decreased activation in RUE.     PATIENT EDUCATION: Ongoing condition-specific education Person educated: Patient and Spouse Education method: Explanation Education comprehension: verbalized understanding and needs further education   HOME EXERCISE PROGRAM: Provided with Self-ROM exercises and handout  Medbridge Code: [id number]    GOALS: Goals reviewed with patient? Yes  SHORT TERM GOALS: Target date: 09/28/21  Pt will be independent with HEP to address RUE ROM. Baseline: Goal status: MET  2.  Pt will be able to demonstrate increased awareness of positioning of RUE during functional tasks and ability to compensate for decreased sensation/proprioception. Baseline:  Goal status: MET  3.  Pt will be able to utilize dominant RUE as gross assist during self-care tasks. Baseline:  Goal status: IN PROGRESS  4.  Pt will be able to complete UB dressing with setup assist only. Baseline:  Goal status: PARTIALLY MET - Pt is able to don pull over shirt without assistance, does still require assistance with donning bra   LONG TERM GOALS: Target date: 10/26/21  Pt and spouse will verbalize understanding of adapted strategies and utilized DME/AE as needed to maximize safety and I with ADLs/ IADLs . Baseline:  Goal status: IN PROGRESS  2.  Pt will be able to utilize dominant RUE at diminished level during self-care tasks. Baseline:  Goal status: IN PROGRESS  3.  Pt will be able to complete UB dressing Mod I. Baseline:  Goal status: IN PROGRESS  4.  Pt will be able to complete LB dressing with supervision/setup. Baseline:  Goal status: IN PROGRESS  5.  Pt will be able to grasp 1" block with RUE to demonstrate improved functional use of RUE to progress towards engagement in objective assessment of box and blocks. Baseline:  Goal status: IN PROGRESS   ASSESSMENT:  CLINICAL IMPRESSION: Session with focus on NMR through RUE with WB and attempts at activation of  R shoulder through unstable surface (ball on leg and large therapy ball).  Therapist incorporated a variety of cues, multimodal and demonstration being most effective to increase participation and success.  Pt tolerated WB through forearm and elbow while positioned on therapist's leg progressing to WB through elbow fully on mat to increase WB.  Patient aphasic but responded well with repetition and gestural and directional cues.  Pt requiring increased time and gestures to communicate with therapist.  Therapist able to explain importance of WB and PROM exercises to continue to maintain mobility and facilitate and NMR recovery.  Educated on use of sling for support to minimize subluxation while also reiterating importance of PROM exercises to attempt to ward off onset of tone.  PERFORMANCE DEFICITS in functional skills including ADLs, IADLs, coordination, dexterity, proprioception, sensation, ROM, strength, pain, FMC, GMC, mobility, balance, body mechanics, endurance, decreased knowledge of precautions, decreased knowledge of use of DME, skin integrity, and UE functional use, cognitive skills including emotional, sequencing, thought, and understand, and psychosocial skills including environmental adaptation.   IMPAIRMENTS are limiting patient from ADLs, IADLs, and social participation.   COMORBIDITIES may have co-morbidities  that affects occupational performance. Patient will benefit from skilled OT to address above impairments and improve overall function.   PLAN: OT FREQUENCY: 2x/week  OT  DURATION: 8 weeks  PLANNED INTERVENTIONS: self care/ADL training, therapeutic exercise, therapeutic activity, neuromuscular re-education, manual therapy, passive range of motion, balance training, functional mobility training, splinting, moist heat, cryotherapy, patient/family education, cognitive remediation/compensation, visual/perceptual remediation/compensation, psychosocial skills training, energy conservation,  coping strategies training, and DME and/or AE instructions  RECOMMENDED OTHER SERVICES: SLP  CONSULTED AND AGREED WITH PLAN OF CARE: Patient and family member/caregiver  PLAN FOR NEXT SESSION:   Review HEP and begin additional PROM/AAROM exercises as appropriate, WB w/ significant shoulder support prn.  Discuss decreasing frequency vs d/c due to interdisciplinary visit limit.   [name], OTR/L 10/04/2021, 3:11 PM

## 2021-10-04 NOTE — Therapy (Signed)
OUTPATIENT SPEECH LANGUAGE PATHOLOGY APHASIA TREATMENT   Patient Name: Kristen Foley MRN: [id number] DOB:11/25/1965, 56 y.o., female Today's Date: 10/04/2021  PCP: Unk Pinto MD REFERRING PROVIDER: Holzmacher, Mia [place], NP   End of Session - 10/04/21 1547     Visit Number 3    Number of Visits 17    Date for SLP Re-Evaluation 11/19/21    SLP Start Time 4081    SLP Stop Time  4481    SLP Time Calculation (min) 40 min    Activity Tolerance Patient tolerated treatment well;Patient limited by fatigue              Past Medical History:  Diagnosis Date   Allergy    Anemia    Past Surgical History:  Procedure Laterality Date   [place]   Patient Active Problem List   Diagnosis Date Noted   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   Expressive aphasia 11/26/2019   Seizures ([place]) 05/31/2019   Hypoalbuminemia 05/31/2019   Hypomagnesemia    Numbness and tingling of right leg 05/30/2019   Speech disturbance 05/30/2019   Transient alteration of awareness 05/30/2019   Fatigue 03/18/2018   FH: hypertension 03/18/2018   Abnormal glucose 03/18/2018   Hyperlipidemia, mixed 03/18/2018   Elevated BP without diagnosis of hypertension 03/18/2018   Vitamin D deficiency 03/18/2018    ONSET DATE: symptoms beginning about 4-6 weeks ago, referral date 08/23/21   REFERRING DIAG: 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   Pt accompanied by: significant other, Chris  PERTINENT HISTORY: labile HTN, HLD, Prediabetes  and Vitamin D Deficiency who was dx'd with an anaplastic Astrocytoma in Feb 2021 and is followed at [facility] with ongoing bimonthly chemo     PAIN:  Are you having pain? No  FALLS: Has  patient fallen in last 6 months?  Number of falls: 3  LIVING ENVIRONMENT: Lives with: lives with their family Lives in: House/apartment  PLOF:  Level of assistance: Independent with ADLs, Needed assistance with IADLS Employment: Full-time employment   PATIENT GOALS: Increased communication of needs and wants, communication strategies for pt and communication partners  OBJECTIVE:   Today's Treatment: 10/04/21: [name] brought in two AAC sheets he had made with pictures and words, some kitchen appliances and bowl, spoon, fork. Second sheet was buildings generated Tuesday, and added feed, feed store, truck, car. SLP suggested [name] make a page for places, and a page for the 2-4 horses [name] has most contact with at the barn. Pt very successful with naming these relevant items when provided with initial phoneme cue and visual cue of photo and word. Without word, pt success still present with initial phoneme or two phonemes and initial written letter cue. Pt tearful during session, fleeting, x4-5. "This is hard!" Heard once. SLP educated [name] throughout session by highlighting cueing technique and timing of cueing, as (after pt's "s" statement) his first cue was asking pt "Is it 14?", instead of phoneme cue. By end of session he was providing initial phoneme cue for pt spontaneously.  Pt req'd max cues usually for pointing to correct picture given a sentence scenario. SLP attempted to make language very simple but limited pictures to f:4 for most questions.  10/02/21: SLP trialed pt with low-tech AAC  given discussion [name] and SLP had about situation that can be frustrating for both [name] and [name] - driving and suddenly [name] points excitedly/frantically for [name] to go somewhere. SLP assisted pt/husband provide choices for words for this AAC page and [name] to add more options to this page and return next session with it. He will also note more of these kind of situations for SLP to assist in construction  of more AAC pages. READING COMPREHENSION: Impaired: word - pt took extra time to read single word responses in low-tech AAC "[place], [place], [employer], [place]" when presented with question (e.g., "you go here to get money"). [name] also showed SLP app mother is using with pt which constructs spoken sentences using text to speech and predictive text. SLP believes pt's word recognition is too slow at this time to use this app. Symbols on this app are abstract and not intuitive. Pt's speech/language today was perseverative in nature and without the correct context.   PATIENT REPORTED OUTCOME MEASURES (PROM): Did not complete due to severity of aphasia.   PATIENT EDUCATION: Education details: See today's treatment Person educated: Patient and Spouse Education method: Explanation and Demonstration Education comprehension: verbalized understanding, verbal cues required, and needs further education   GOALS: Goals reviewed with patient? Yes  SHORT TERM GOALS: Target date: 10/18/2021    Pt and husband will demonstrate use of supported communication strategies in 5 min conversation to assist in decrease of communication breakdowns.  Baseline: Goal status: Ongoing  2.  Pt will follow 1-step directions with modA verbal cues.  Baseline:  Goal status: Ongoing  3.  Pt and husband will complete salient vocabulary list for HEP practice.  Baseline:  Goal status:Ongoing  4.  Pt will identify pictures with 90% accuracy from F06+ AAC board to increase ability to communicate wants/needs with communication partner.  Baseline:  Goal status: Ongoing   LONG TERM GOALS: Target date: 11/15/2021    Pt/husband will provide 1-2 examples of successful use of supported communication strategies at home.  Baseline:  Goal status: Ongoing  2.  Pt will follow 1-step directions given minA verbal cues. Baseline:  Goal status: Ongoing  3.  Pt/husband will provide 1-2 examples of successful use of low/high tech AAC  at home. Baseline:  Goal status: Ongoing  4.  Pt will answer simple yes/no questions with 80% acc given visual/orthographic cue.  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 Pt husband was primary informant this session due to the nature of pt's aphasia/apraxia. SLP continued to assist husband in education for proper cueing and more instruction to develop a low tech AAC board for situations when pt/husband require more efficient communiation. SLP rec cont'd skilled ST services to address strategies for both pt and main communication partner as well as, identify possible means for alternative and/or augmentative communication.    OBJECTIVE IMPAIRMENTS include aphasia and apraxia. These impairments are limiting patient from ADLs/IADLs and effectively communicating at home and in community. Factors affecting potential to achieve goals and functional outcome are medical prognosis. Patient will benefit from skilled SLP services to address above impairments and improve overall function.  REHAB POTENTIAL: Fair due to medical prognosis  PLAN: SLP FREQUENCY: 2x/week  SLP DURATION: 8 weeks  PLANNED INTERVENTIONS: Language facilitation, Environmental controls, Cueing hierachy, Internal/external aids, Functional tasks, Multimodal communication approach, SLP  instruction and feedback, Compensatory strategies, and Patient/family education    [facility], [place] 10/04/2021, 3:47 PM

## 2021-10-05 ENCOUNTER — Ambulatory Visit: Payer: 59

## 2021-10-05 DIAGNOSIS — R278 Other lack of coordination: Secondary | ICD-10-CM | POA: Diagnosis not present

## 2021-10-05 DIAGNOSIS — M6281 Muscle weakness (generalized): Secondary | ICD-10-CM | POA: Diagnosis not present

## 2021-10-05 DIAGNOSIS — R2689 Other abnormalities of gait and mobility: Secondary | ICD-10-CM | POA: Diagnosis not present

## 2021-10-05 DIAGNOSIS — R482 Apraxia: Secondary | ICD-10-CM | POA: Diagnosis not present

## 2021-10-05 DIAGNOSIS — R2681 Unsteadiness on feet: Secondary | ICD-10-CM | POA: Diagnosis not present

## 2021-10-05 DIAGNOSIS — I69851 Hemiplegia and hemiparesis following other cerebrovascular disease affecting right dominant side: Secondary | ICD-10-CM

## 2021-10-05 DIAGNOSIS — R4701 Aphasia: Secondary | ICD-10-CM | POA: Diagnosis not present

## 2021-10-05 DIAGNOSIS — R41840 Attention and concentration deficit: Secondary | ICD-10-CM | POA: Diagnosis not present

## 2021-10-05 NOTE — Therapy (Signed)
OUTPATIENT PHYSICAL THERAPY TREATMENT NOTE, Progress Note, Recertification   Patient Name: Kristen Foley MRN: [id number] DOB:1965/12/15, 56 y.o., female Today's Date: 10/05/2021  PCP: Unk Pinto, MD REFERRING PROVIDER: Margarita Grizzle, NP  Progress Note Reporting Period 08/28/21 to 10/05/21  See note below for Objective Data and Assessment of Progress/Goals.     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   Expressive aphasia 11/26/2019   Seizures ([place]) 05/31/2019   Hypoalbuminemia 05/31/2019   Hypomagnesemia    Numbness and tingling of right leg 05/30/2019   Speech disturbance 05/30/2019   Transient alteration of awareness 05/30/2019   Fatigue 03/18/2018   FH: hypertension 03/18/2018   Abnormal glucose 03/18/2018   Hyperlipidemia, mixed 03/18/2018   Elevated BP without diagnosis of hypertension 03/18/2018   Vitamin D deficiency 03/18/2018    REFERRING DIAG:  C71.9 (ICD-10-CM) - Malignant neoplasm of brain, unspecified   THERAPY DIAG:  Muscle weakness (generalized)  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 No   OBJECTIVE:   TODAY'S TREATMENT: 10/05/21 Activity Comments  TUG test 14 sec  5xSTS 11.25 sec  10 meter walk: 9.63 sec 3.4 ft/sec  Foot taps 3x10 On Bosu, assist to RLE  Foot on top of Bosu, static hold x15 sec 3x EO/EC  Sidestepping with CGA-min A Right LOB w/ loading response  Education regarding Giv Mohr sling   Floor to stand transfer Mod-max A      PATIENT EDUCATION: Education details: spoke to husband about this PT reaching out to [facility] care management with his consent to allow for assistance in the home while he is working  Person educated: Spouse Education method: Explanation Education comprehension: verbalized understanding   HEP as of 09/19/21: Access Code: [email or web address] URL: [email or web address] Date: 09/19/2021 Prepared by: [facility]  Exercises - Seated Gluteal Sets  - 2-3 x daily - 7 x weekly - 2 sets - 10 reps - 3 sec hold - Sit to stand in stride stance  - 2 x daily - 7 x weekly - 2 sets - 10 reps - Seated Hamstring Stretch  - 2-3 x daily - 7 x weekly - 1 sets - 3 reps - 10-15 sec hold - Standing Gastroc Stretch  on Step  - 1 x daily - 7 x weekly - 1 sets - 3 reps - 10-15 sec hold - Seated Gastroc Stretch with Strap  - 2-3 x daily - 7 x weekly - 1 sets - 3 reps - 10-15 sec hold - Mini Squat with Counter Support  - 1 x daily - 5 x weekly - 2 sets - 10 reps - Single Leg Bridge  - 1 x daily - 5 x weekly - 2 sets - 10 reps - Side Stepping with Counter Support  - 1 x daily - 5 x weekly - 2 sets - 10 reps - Walking with Counter Support  - 1 x daily - 5 x weekly - 2 sets - 10 reps - Single Leg Stance with Support  - 1 x daily - 5 x weekly - 2 sets - 30 sec hold - Forward Step Up  - 1 x daily - 5 x  weekly - 2 sets - 10 reps  GOALS: Goals reviewed with patient? Yes   SHORT TERM GOALS: Target date: 10/23/21   Pt will be supervision with HEP for improved strength, balance, gait. Baseline: Goal status: MET 09/12/21   2.  Pt will improve 5x sit<>stand to less than or equal to 13 sec to demonstrate improved functional strength and transfer efficiency. Baseline: 16.19 sec; 11.25 sec Goal status: MET; 14.06 sec with L hand on knee 09/24/21; 11.25 sec 10/05/21   3.  Pt will demo safe use of appropriate orthotic and assistive device for improved safety with functional mobility and gait. Baseline: Trial of foot-up brace in session; family plans to order Goal status: MET 09/24/21; using foot up brace consistently        4.  Pt will demo supervision floor to stand transfer to improve functional independence and participation in ADL at home.   Baseline: mod-max A   Goal status: INITIAL  LONG TERM GOALS: Target date: 11/16/21   Pt will be supervision with HEP for improved strength, balance, transfers, and gait. Baseline:  Goal status:IN PROGRESS   2.  Pt will improve 5x sit<>stand to less than or equal to 11.5 sec to demonstrate improved functional strength and transfer efficiency. Baseline:  Goal status: MET 14.06 sec with L hand on knee 09/24/21; 11.25 sec on 10/05/21   3.  Pt will improve TUG score to less than or equal to 13.5 sec for decreased fall risk. Baseline: 14.84 sec; 14 sec on 10/05/21 Goal status: IN PROGRESS   4.  Pt will improve gait velocity to at least 2.62 ft/sec for improved gait efficiency and safety. Baseline: 2.47 ft/sec; 3.4 ft/sec on 10/05/21 Goal status: MET     ASSESSMENT:   CLINICAL IMPRESSION: Performed STG/LTG for progress note/recertification with pt meeting 3/3 STG with addition of new STG and met 2/4 LTG.  Demonstrates increased gait speed and improved foot clearance RLE with use of foot-up brace.  Demonstrates 3/5 right quad and 2+ to 3-/5 right  hamstring strength with obvious muscular deficits with forced weightbearing RLE as the knee assumes gradual flexion in stance phase or LOB with lateral loading.  Demonstrated/discussed benefits of Give Mohr sling to aid in RUE posture/position that could aid in balance and reciprocal arm swing during gait.  Spouse/caregiver notes limited PT-specific activities due to safety risk involved with standing strength/balance activities and that he is often unable to provide CGA during the day due to work duties.  Explained/demonstrated forced RLE WB at sink during functional activities and  discussion of functional mobility practice such as floor to stand recovery to maintain skillset. Continued sessions 1-2x/wk for additional refinement of activities and progress to self-directed activity       OBJECTIVE IMPAIRMENTS Abnormal gait, decreased balance, decreased mobility, difficulty walking, decreased strength, decreased safety awareness, impaired flexibility, and impaired tone.    ACTIVITY LIMITATIONS standing, squatting, stairs, transfers, and locomotion level   PARTICIPATION LIMITATIONS: shopping, community activity, and occupation   PERSONAL FACTORS HTN, HLD, seizures, anaplastic astrocytoma are also affecting patient's functional outcome.    REHAB POTENTIAL: Good   CLINICAL DECISION MAKING: Unstable/unpredictable   EVALUATION COMPLEXITY: High   PLAN: PT FREQUENCY: 1-2x/week   PT DURATION: 6 weeks   PLANNED INTERVENTIONS: Therapeutic exercises, Therapeutic activity, Neuromuscular re-education, Balance training, Gait training, Patient/Family education, Joint mobilization, Orthotic/Fit training, DME instructions, and Manual therapy   PLAN FOR NEXT SESSION: Review updates to HEP; further work on RLE strengthening, standing balance.  Gait training with foot-up brace.  May want to include supine hip and LLE strengthening, standing balance.  Monitor pt's activity tolerance due to starting radiation on  08/29/21.     12:07 PM, 10/05/21 M. Sherlyn Lees, PT, DPT Physical Therapist- [place] Office Number: [phone]   [place] [facility] [address] [place], [place] [address] Phone # [phone] Fax # [phone]

## 2021-10-10 ENCOUNTER — Ambulatory Visit: Payer: 59 | Attending: Adult Health Nurse Practitioner | Admitting: Occupational Therapy

## 2021-10-10 DIAGNOSIS — R2689 Other abnormalities of gait and mobility: Secondary | ICD-10-CM | POA: Insufficient documentation

## 2021-10-10 DIAGNOSIS — R2681 Unsteadiness on feet: Secondary | ICD-10-CM | POA: Insufficient documentation

## 2021-10-10 DIAGNOSIS — I69851 Hemiplegia and hemiparesis following other cerebrovascular disease affecting right dominant side: Secondary | ICD-10-CM | POA: Diagnosis not present

## 2021-10-10 DIAGNOSIS — R41840 Attention and concentration deficit: Secondary | ICD-10-CM | POA: Insufficient documentation

## 2021-10-10 DIAGNOSIS — R278 Other lack of coordination: Secondary | ICD-10-CM | POA: Insufficient documentation

## 2021-10-10 DIAGNOSIS — M6281 Muscle weakness (generalized): Secondary | ICD-10-CM | POA: Insufficient documentation

## 2021-10-10 NOTE — Therapy (Signed)
OUTPATIENT OCCUPATIONAL THERAPY TREATMENT NOTE  Patient Name: GENNETT ANSARI MRN: [id number] DOB:Jan 01, 1966, 56 y.o., female Today's Date: 10/11/2021  PCP: Lucky Cowboy, MD REFERRING PROVIDER: Holzmacher, Alain [place], NP    OT End of Session - 10/11/21 1612     Visit Number 11    Number of Visits 17    Date for OT Re-Evaluation 10/26/21    Authorization Type Aet[employer]    OT Start Time 1405    OT Stop Time 1450    OT Time Calculation (min) 45 min    Activity Tolerance Patient tolerated treatment well    Behavior During Therapy [facility] for tasks assessed/performed              Past Medical History:  Diagnosis Date   Allergy    Anemia    Past Surgical History:  Procedure Laterality Date   MANDIBLE RECONSTRUCTION  1986   Patient Active Problem List   Diagnosis Date Noted   Anaplastic astrocytoma (HCC) 12/31/2019   Expressive aphasia 11/26/2019   Seizures (HCC) 05/31/2019   Hypoalbuminemia 05/31/2019   Hypomagnesemia    Numbness and tingling of right leg 05/30/2019   Speech disturbance 05/30/2019   Transient alteration of awareness 05/30/2019   Fatigue 03/18/2018   FH: hypertension 03/18/2018   Abnormal glucose 03/18/2018   Hyperlipidemia, mixed 03/18/2018   Elevated BP without diagnosis of hypertension 03/18/2018   Vitamin D deficiency 03/18/2018    ONSET DATE: symptoms beginning around mid-April 2023, referral date 08/23/21  REFERRING DIAG: C71.9 (ICD-10-CM) - Malignant neoplasm of brain, unspecified   THERAPY DIAG:  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 No  PATIENT GOALS to be independent   OBJECTIVE:   TODAY'S TREAMENT: Engaged in lengthy discussion regarding current functional level with pt's spouse stating that pt is able to don shirt however does require assistance with pulling up straps of bra and fastening bra in back.  Educated on adaptive techniques to compensate for impaired RUE with donning pull over sports bra or fastening bra in front on lap with use of RUE as stabilizer and then pulling bra on overhead like pullover shirt.  Pt's spouse reporting understanding and willing to attempt and/or trial sports bra.   Utilized saebo glide with focus on grasp/release, shoulder flexion/extension, abduction/adduction in horizontal pattern to eliminate gravity.  Therapist providing support for positioning of hand.  Pt able to squeeze glide piece on saebo glide this session! Pt continues to demonstrate decreased shoulder flexion, however able to achieve extension with min-mod cues and facilitation.  Pt continues to incorporate trunk for mobility.  Pt completed adduction to move R hand from therapist's leg to her leg with increased effort and therapist providing multimodal cues to decrease compensatory movements this session.  Pt unable to complete abduction at this time. Reviewed technique for self-ROM.  Pt demonstrating decreased recall and carryover of shoulder flexion, requiring demonstration, hand over hand faded to  supervision, and use of target to increase carryover.  Encouraged use of demonstration, simple cues, and target to improve technique and accuracy.   PATIENT EDUCATION: Ongoing condition-specific education Person educated: Patient and Spouse Education method:  Explanation Education comprehension: verbalized understanding and needs further education   HOME EXERCISE PROGRAM: Provided with Self-ROM exercises and handout  Medbridge Code: [email or web address]    GOALS: Goals reviewed with patient? Yes  SHORT TERM GOALS: Target date: 09/28/21  Pt will be independent with HEP to address RUE ROM. Baseline: Goal status: MET  2.  Pt will be able to demonstrate increased awareness of positioning of RUE during functional tasks and ability to compensate for decreased sensation/proprioception. Baseline:  Goal status: MET  3.  Pt will be able to utilize dominant RUE as gross assist during self-care tasks. Baseline:  Goal status: IN PROGRESS  4.  Pt will be able to complete UB dressing with setup assist only. Baseline:  Goal status: PARTIALLY MET - Pt is able to don pull over shirt without assistance, does still require assistance with donning bra   LONG TERM GOALS: Target date: 10/26/21  Pt and spouse will verbalize understanding of adapted strategies and utilized DME/AE as needed to maximize safety and I with ADLs/ IADLs . Baseline:  Goal status: IN PROGRESS  2.  Pt will be able to utilize dominant RUE at diminished level during self-care tasks. Baseline:  Goal status: IN PROGRESS  3.  Pt will be able to complete UB dressing Mod I. Baseline:  Goal status: IN PROGRESS  4.  Pt will be able to complete LB dressing with supervision/setup. Baseline:  Goal status: IN PROGRESS  5.  Pt will be able to grasp 1" block with RUE to demonstrate improved functional use of RUE to progress towards engagement in objective assessment of box and blocks. Baseline:  Goal status: IN PROGRESS   ASSESSMENT:  CLINICAL IMPRESSION: Session with focus on review of goals, discussion of progress and current plan in regards to chemo treatment and prognosis.  Pt's spouse states MRI 7/22 and visit with MD on 7/24 to then discuss results and whether to have another course of  chemo.  Therapist continues to incorporate a variety of cues, multimodal and demonstration being most effective to increase participation and success.  Pt demonstrating improved grasp this session and ability to activate shoulder extension and adduction in gravity minimized position. Patient aphasic but responded well with repetition and gestural and directional cues.  Pt requiring increased time and gestures to communicate with therapist.  Educated on compensatory strategies for UB dressing and hemi-technique with dressing.  Reviewed self-ROM and encouraged use of multimodal cues and guidance during exercises.  Engaged in discussion about limitations in recovery due to current status and decreased activation of RUE in functional tasks and exercises to this date, frequently using trunk to compensate.  Pt also limited by impaired motor planning requiring mod-max verbal, demonstration, and tactile cues for tasks.  Pt also with limited carryover from session to session. Plan to continue 1x/ week due to pt and spouse desire to focus on UE NMR until meeting with MD 7/24 and then reassess remainder of visits and focus based on results of MRI and MD recommendations.  PERFORMANCE DEFICITS in functional skills including ADLs, IADLs, coordination, dexterity, proprioception, sensation, ROM, strength, pain, FMC, GMC, mobility, balance, body mechanics, endurance, decreased knowledge of precautions, decreased knowledge of use of DME, skin integrity, and UE functional use, cognitive skills including emotional, sequencing, thought, and understand, and  psychosocial skills including environmental adaptation.   IMPAIRMENTS are limiting patient from ADLs, IADLs, and social participation.   COMORBIDITIES may have co-morbidities  that affects occupational performance. Patient will benefit from skilled OT to address above impairments and improve overall function.   PLAN: OT FREQUENCY: 2x/week  OT DURATION: 8 weeks  PLANNED  INTERVENTIONS: self care/ADL training, therapeutic exercise, therapeutic activity, neuromuscular re-education, manual therapy, passive range of motion, balance training, functional mobility training, splinting, moist heat, cryotherapy, patient/family education, cognitive remediation/compensation, visual/perceptual remediation/compensation, psychosocial skills training, energy conservation, coping strategies training, and DME and/or AE instructions  RECOMMENDED OTHER SERVICES: SLP  CONSULTED AND AGREED WITH PLAN OF CARE: Patient and family member/caregiver  PLAN FOR NEXT SESSION:   Review HEP and begin additional PROM/AAROM exercises as appropriate, WB w/ significant shoulder support prn.    Rosalio Loud, OTR/L 10/11/2021, 3:11 PM

## 2021-10-11 ENCOUNTER — Ambulatory Visit: Payer: 59

## 2021-10-16 DIAGNOSIS — C719 Malignant neoplasm of brain, unspecified: Secondary | ICD-10-CM | POA: Diagnosis not present

## 2021-10-16 DIAGNOSIS — C711 Malignant neoplasm of frontal lobe: Secondary | ICD-10-CM | POA: Diagnosis not present

## 2021-10-17 ENCOUNTER — Ambulatory Visit: Payer: 59 | Admitting: Occupational Therapy

## 2021-10-17 ENCOUNTER — Encounter: Payer: 59 | Admitting: Speech Pathology

## 2021-10-17 DIAGNOSIS — M6281 Muscle weakness (generalized): Secondary | ICD-10-CM

## 2021-10-17 DIAGNOSIS — R2681 Unsteadiness on feet: Secondary | ICD-10-CM | POA: Diagnosis not present

## 2021-10-17 DIAGNOSIS — I69851 Hemiplegia and hemiparesis following other cerebrovascular disease affecting right dominant side: Secondary | ICD-10-CM | POA: Diagnosis not present

## 2021-10-17 DIAGNOSIS — R278 Other lack of coordination: Secondary | ICD-10-CM | POA: Diagnosis not present

## 2021-10-17 DIAGNOSIS — R41840 Attention and concentration deficit: Secondary | ICD-10-CM | POA: Diagnosis not present

## 2021-10-17 DIAGNOSIS — R2689 Other abnormalities of gait and mobility: Secondary | ICD-10-CM | POA: Diagnosis not present

## 2021-10-17 NOTE — Therapy (Signed)
OUTPATIENT OCCUPATIONAL THERAPY TREATMENT NOTE  Patient Name: Kristen Foley MRN: [id number] DOB:10/25/1965, 56 y.o., female Today's Date: 10/17/2021  PCP: Unk Pinto, MD REFERRING PROVIDER: Holzmacher, Mia [place], NP    OT End of Session - 10/17/21 1018     Visit Number 12    Number of Visits 17    Date for OT Re-Evaluation 10/26/21    Authorization Type [employer]et[employer]    OT Start Time 1022    OT Stop Time 1102    OT Time Calculation (min) 40 min    Activity Tolerance Patient tolerated treatment well    Behavior During Therapy WFL for tasks assessed/performed               Past Medical History:  Diagnosis Date   Allergy    Anemia    Past Surgical History:  Procedure Laterality Date   [place]   Patient Active Problem List   Diagnosis Date Noted   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   Expressive aphasia 11/26/2019   Seizures ([place]) 05/31/2019   Hypoalbuminemia 05/31/2019   Hypomagnesemia    Numbness and tingling of right leg 05/30/2019   Speech disturbance 05/30/2019   Transient alteration of awareness 05/30/2019   Fatigue 03/18/2018   FH: hypertension 03/18/2018   Abnormal glucose 03/18/2018   Hyperlipidemia, mixed 03/18/2018   Elevated BP without diagnosis of hypertension 03/18/2018   Vitamin D deficiency 03/18/2018    ONSET DATE: symptoms beginning around mid-April 2023, referral date 08/23/21  REFERRING DIAG: C71.9 (ICD-10-CM) - Malignant neoplasm of brain, unspecified   THERAPY DIAG:  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 No  PATIENT GOALS to be independent   OBJECTIVE:   TODAY'S TREAMENT: Engaged in functional grasp and release on Saebo glide as pt demonstrating improvements in grasp and release this session.  Pt demonstrating improved grasp, but continues to have difficulty with releasing grasp.  Engaged in shoulder and elbow flexion/extension with use of saebo glide in horizontal position to decrease effects of gravity on movement. Pt demonstrating increased sustained grasp, however continues to demonstrate difficulty with flexion/extension.  Therapist providing tactile cues at elbow to further facilitate ROM. NMR in sitting with use of ball in BUE: engaged in shoulder flexion/extension, elbow flexion/extension on ball with focus on symmetrical movements. Pt demonstrating improved placement of R hand on ball and ability to maintain contact during shoulder flexion/extension and progressing to elbow flexion/extension.  Once pt in shoulder flexion, she has difficulty extending.  Therapist providing tactile cues at elbow to promote extension.  Therapist providing visual, demonstration, and tactile cues for sequencing of task.  Pt completing shoulder flexion/extension, chest press with focus on elbow flexion/extension, and then tapping target to R and L to further facilitate motor control and internal/external rotation. Attempted wall pushups in standing to increase WB and facilitation of elbow extension. Pt with no activation in this position, therefore transitioned to completing partial sit > stand while WB through B hands  on mat table.  Educated on importance of WB for motor recovery.   PATIENT EDUCATION: Ongoing condition-specific education Person educated: Patient and Spouse Education  method: Explanation Education comprehension: verbalized understanding and needs further education   HOME EXERCISE PROGRAM: Provided with Self-ROM exercises and handout  Medbridge Code: [id number]    GOALS: Goals reviewed with patient? Yes  SHORT TERM GOALS: Target date: 09/28/21  Pt will be independent with HEP to address RUE ROM. Baseline: Goal status: MET  2.  Pt will be able to demonstrate increased awareness of positioning of RUE during functional tasks and ability to compensate for decreased sensation/proprioception. Baseline:  Goal status: MET  3.  Pt will be able to utilize dominant RUE as gross assist during self-care tasks. Baseline:  Goal status: IN PROGRESS  4.  Pt will be able to complete UB dressing with setup assist only. Baseline:  Goal status: PARTIALLY MET - Pt is able to don pull over shirt without assistance, does still require assistance with donning bra   LONG TERM GOALS: Target date: 10/26/21  Pt and spouse will verbalize understanding of adapted strategies and utilized DME/AE as needed to maximize safety and I with ADLs/ IADLs . Baseline:  Goal status: IN PROGRESS  2.  Pt will be able to utilize dominant RUE at diminished level during self-care tasks. Baseline:  Goal status: IN PROGRESS  3.  Pt will be able to complete UB dressing Mod I. Baseline:  Goal status: IN PROGRESS  4.  Pt will be able to complete LB dressing with supervision/setup. Baseline:  Goal status: IN PROGRESS  5.  Pt will be able to grasp 1" block with RUE to demonstrate improved functional use of RUE to progress towards engagement in objective assessment of box and blocks. Baseline:  Goal status: IN PROGRESS   ASSESSMENT:  CLINICAL IMPRESSION: Session with focus on AAROM of shoulder flexion/extension and elbow flexion/extension.  Pt demonstrating improved grasp and release this session and elbow flexion, however demonstrating decreased functional use and activation during  structured tasks. Pt demonstrating good engagement during self-ROM exercises with ball due to increased functional task and perceived progress towards goals.  Therapist continues to incorporate a variety of cues, multimodal and demonstration being most effective to increase participation and success.  Therapist reiterating importance of WB for NMR and engaging in self-ROM with use of target and BUE use to compensate for decreased AROM in RUE. Patient aphasic but responded well with repetition and gestural and directional cues.  Pt requiring increased time and gestures to communicate with therapist.   PERFORMANCE DEFICITS in functional skills including ADLs, IADLs, coordination, dexterity, proprioception, sensation, ROM, strength, pain, FMC, GMC, mobility, balance, body mechanics, endurance, decreased knowledge of precautions, decreased knowledge of use of DME, skin integrity, and UE functional use, cognitive skills including emotional, sequencing, thought, and understand, and psychosocial skills including environmental adaptation.   IMPAIRMENTS are limiting patient from ADLs, IADLs, and social participation.   COMORBIDITIES may have co-morbidities  that affects occupational performance. Patient will benefit from skilled OT to address above impairments and improve overall function.   PLAN: OT FREQUENCY: 2x/week  OT DURATION: 8 weeks  PLANNED INTERVENTIONS: self care/ADL training, therapeutic exercise, therapeutic activity, neuromuscular re-education, manual therapy, passive range of motion, balance training, functional mobility training, splinting, moist heat, cryotherapy, patient/family education, cognitive remediation/compensation, visual/perceptual remediation/compensation, psychosocial skills training, energy conservation, coping strategies training, and DME and/or AE instructions  RECOMMENDED OTHER SERVICES: SLP  CONSULTED AND AGREED WITH PLAN OF CARE: Patient  and family  member/caregiver  PLAN FOR NEXT SESSION:   Review HEP and begin additional PROM/AAROM exercises as appropriate, WB w/ significant shoulder support prn.    [name], OTR/L 10/17/2021, 3:11 PM

## 2021-10-19 ENCOUNTER — Encounter: Payer: 59 | Admitting: Occupational Therapy

## 2021-10-22 ENCOUNTER — Ambulatory Visit: Payer: 59 | Admitting: Physical Therapy

## 2021-10-22 ENCOUNTER — Other Ambulatory Visit: Payer: 59

## 2021-10-22 ENCOUNTER — Encounter: Payer: 59 | Admitting: Occupational Therapy

## 2021-10-22 DIAGNOSIS — Z79899 Other long term (current) drug therapy: Secondary | ICD-10-CM | POA: Diagnosis not present

## 2021-10-22 DIAGNOSIS — Z5111 Encounter for antineoplastic chemotherapy: Secondary | ICD-10-CM

## 2021-10-22 DIAGNOSIS — D701 Agranulocytosis secondary to cancer chemotherapy: Secondary | ICD-10-CM | POA: Diagnosis not present

## 2021-10-22 DIAGNOSIS — C719 Malignant neoplasm of brain, unspecified: Secondary | ICD-10-CM | POA: Diagnosis not present

## 2021-10-22 DIAGNOSIS — T451X5A Adverse effect of antineoplastic and immunosuppressive drugs, initial encounter: Secondary | ICD-10-CM

## 2021-10-23 LAB — CBC WITH DIFFERENTIAL/PLATELET
Absolute Monocytes: 169 cells/uL — ABNORMAL LOW (ref 200–950)
Basophils Absolute: 11 cells/uL (ref 0–200)
Basophils Relative: 0.3 %
Eosinophils Absolute: 0 cells/uL — ABNORMAL LOW (ref 15–500)
Eosinophils Relative: 0 %
HCT: 38.4 % (ref 35.0–45.0)
Hemoglobin: 13.2 g/dL (ref 11.7–15.5)
Lymphs Abs: 248 cells/uL — ABNORMAL LOW (ref 850–3900)
MCH: 35.6 pg — ABNORMAL HIGH (ref 27.0–33.0)
MCHC: 34.4 g/dL (ref 32.0–36.0)
MCV: 103.5 fL — ABNORMAL HIGH (ref 80.0–100.0)
MPV: 10.2 fL (ref 7.5–12.5)
Monocytes Relative: 4.7 %
Neutro Abs: 3172 cells/uL (ref 1500–7800)
Neutrophils Relative %: 88.1 %
Platelets: 70 10*3/uL — ABNORMAL LOW (ref 140–400)
RBC: 3.71 10*6/uL — ABNORMAL LOW (ref 3.80–5.10)
RDW: 14.1 % (ref 11.0–15.0)
Total Lymphocyte: 6.9 %
WBC: 3.6 10*3/uL — ABNORMAL LOW (ref 3.8–10.8)

## 2021-10-23 LAB — COMPLETE METABOLIC PANEL WITH GFR
AG Ratio: 1.9 (calc) (ref 1.0–2.5)
ALT: 26 U/L (ref 6–29)
AST: 22 U/L (ref 10–35)
Albumin: 4 g/dL (ref 3.6–5.1)
Alkaline phosphatase (APISO): 58 U/L (ref 37–153)
BUN: 23 mg/dL (ref 7–25)
CO2: 29 mmol/L (ref 20–32)
Calcium: 9.3 mg/dL (ref 8.6–10.4)
Chloride: 107 mmol/L (ref 98–110)
Creat: 0.78 mg/dL (ref 0.50–1.03)
Globulin: 2.1 g/dL (calc) (ref 1.9–3.7)
Glucose, Bld: 113 mg/dL — ABNORMAL HIGH (ref 65–99)
Potassium: 5.2 mmol/L (ref 3.5–5.3)
Sodium: 145 mmol/L (ref 135–146)
Total Bilirubin: 0.5 mg/dL (ref 0.2–1.2)
Total Protein: 6.1 g/dL (ref 6.1–8.1)
eGFR: 91 mL/min/{1.73_m2} (ref >=60)

## 2021-10-23 NOTE — Progress Notes (Signed)
<><><><><><><><><><><><><><><><><><><><><><><><><><><><><><><><><> <><><><><><><><><><><><><><><><><><><><><><><><><><><><><><><><><>  -   Labs  - OK - Fax to [facility]   <><><><><><><><><><><><><><><><><><><><><><><><><><><><><><><><><> <><><><><><><><><><><><><><><><><><><><><><><><><><><><><><><><><>

## 2021-10-24 ENCOUNTER — Encounter: Payer: Self-pay | Admitting: Occupational Therapy

## 2021-10-24 ENCOUNTER — Ambulatory Visit: Payer: 59 | Admitting: Occupational Therapy

## 2021-10-24 DIAGNOSIS — R41840 Attention and concentration deficit: Secondary | ICD-10-CM | POA: Diagnosis not present

## 2021-10-24 DIAGNOSIS — M6281 Muscle weakness (generalized): Secondary | ICD-10-CM

## 2021-10-24 DIAGNOSIS — I69851 Hemiplegia and hemiparesis following other cerebrovascular disease affecting right dominant side: Secondary | ICD-10-CM

## 2021-10-24 DIAGNOSIS — R2689 Other abnormalities of gait and mobility: Secondary | ICD-10-CM

## 2021-10-24 DIAGNOSIS — R2681 Unsteadiness on feet: Secondary | ICD-10-CM | POA: Diagnosis not present

## 2021-10-24 DIAGNOSIS — R278 Other lack of coordination: Secondary | ICD-10-CM

## 2021-10-24 NOTE — Therapy (Signed)
OUTPATIENT OCCUPATIONAL THERAPY TREATMENT NOTE  Patient Name: Kristen Foley MRN: [id number] DOB:1965/08/05, 56 y.o., female Today's Date: 10/24/2021  PCP: Unk Pinto, MD REFERRING PROVIDER: Holzmacher, Mia [place], NP    OT End of Session - 10/24/21 1624     Visit Number 13    Number of Visits 17    Date for OT Re-Evaluation 10/26/21    Authorization Type Aetna    Authorization - Visit Number 25    Authorization - Number of Visits 35   combined OT/PT/ST   OT Start Time 3790    OT Stop Time 1706    OT Time Calculation (min) 46 min    Activity Tolerance Patient tolerated treatment well    Behavior During Therapy WFL for tasks assessed/performed            Past Medical History:  Diagnosis Date   Allergy    Anemia    Past Surgical History:  Procedure Laterality Date   [place]   Patient Active Problem List   Diagnosis Date Noted   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   Expressive aphasia 11/26/2019   Seizures ([place]) 05/31/2019   Hypoalbuminemia 05/31/2019   Hypomagnesemia    Numbness and tingling of right leg 05/30/2019   Speech disturbance 05/30/2019   Transient alteration of awareness 05/30/2019   Fatigue 03/18/2018   FH: hypertension 03/18/2018   Abnormal glucose 03/18/2018   Hyperlipidemia, mixed 03/18/2018   Elevated BP without diagnosis of hypertension 03/18/2018   Vitamin D deficiency 03/18/2018    ONSET DATE: symptoms beginning around mid-April 2023, referral date 08/23/21  REFERRING DIAG: C71.9 (ICD-10-CM) - Malignant neoplasm of brain, unspecified   THERAPY DIAG:  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 No  PATIENT GOALS to be independent   OBJECTIVE:   TODAY'S TREAMENT:  Neuromuscular Reeducation RUE shoulder flex/ext w/ elbow extended completed using Saebo glide stabilized at approx 45 degree angle to decrease effects of gravity on movement. Pt able to achieve good shoulder flexion w/ OT providing min verbal/tactile cues to decrease compensatory trunk and shoulder movements and occ verbal cues for breath during exercse  Closed-chain AAROM of shoulder flex holding small unweighted ball while sitting EOM; elbows flexed at fixed position throughout to encourage anterior deltoid activation vs compensatory movements. Decreased arc of motion w/ increased repetition.  Pt repositioned to supine for increased scapular stability/support to complete closed-chain AAROM of shoulder flex holding small unweighted ball. Much improved ROM w/ pt able to achieve shoulder flex WFL. OT w/ visual/tactile cue for ROM before compensatory back arching.  Engaged in [place] shoulder ER, elbow flex/ext, and forearm and wrist AROM in supine w/ scapula supported and towel roll under RUE for alignment. Increased success w/ increased repetitions though sequencing limited by aphasia. Also attempted place and hold exercises for equal activation w/ fair success/notable compensatory shoulder IR.  BUE rows w/ emphasis on scapular retraction x10 completed holding  1 lb dumbbells while sitting EOM; consistent tactile cues for scapular retract/dep each rep w/ pt maintaining good hold on dumbbell throughout  Isolated finger AROM, attempting thumb to finger opposition w/ all digits, isolating each  finger to count to 5, and finger abd/adduction. Slightly increased success w/ finger flex vs ext. Limitations w/ sequencing and motor planning impacted success w/ attempting repetitions    PATIENT EDUCATION: Ongoing condition-specific education, particularly related to neuro reed and motor learning, as well as benefit of incorporating RUE as much as possible during functional activities (safely) Person educated: Patient and Spouse Education method: Explanation Education comprehension: verbalized understanding and needs further education   HOME EXERCISE PROGRAM: Provided with Self-ROM exercises and handout  Medbridge Code: [id number] - Seated Shoulder Shrugs  - 5-10 reps - Seated Scapular Retraction  - 5-10 reps - Sidelying Scapular Retraction  - 1-2 sets - 10 reps - Sidelying Shoulder Flexion  - 1-2 sets - 10 reps - Sidelying Shoulder External Rotation  - 1-2 sets - 10 reps - Sidelying Shoulder Abduction  - 1-2 sets - 10 reps - Towel Slides in Various Directions at Counter  - 1-2 sets - 10 reps - Hand AROM Composite Flexion  - 20 reps - Thumb Opposition  - 1 sets - 10 reps - Seated Finger MP Extension AROM with Blocking  - 1 sets - 10 reps - Finger Spreading  - 15 reps  GOALS: Goals reviewed with patient? Yes  SHORT TERM GOALS: Target date: 09/28/21  Pt will be independent with HEP to address RUE ROM. Baseline: Goal status: MET  2.  Pt will be able to demonstrate increased awareness of positioning of RUE during functional tasks and ability to compensate for decreased sensation/proprioception. Baseline:  Goal status: MET  3.  Pt will be able to utilize dominant RUE as gross assist during self-care tasks. Baseline:  Goal status: MET  4.  Pt will be able to complete UB dressing with setup assist only. Baseline:  Goal status: PARTIALLY MET - Pt is able to don pull over shirt without assistance, does still require assistance with donning bra   LONG TERM GOALS: Target date:  10/26/21  Pt and spouse will verbalize understanding of adapted strategies and utilized DME/AE as needed to maximize safety and I with ADLs/ IADLs . Baseline:  Goal status: IN PROGRESS  2.  Pt will be able to utilize dominant RUE at diminished level during self-care tasks. Baseline:  Goal status: IN PROGRESS  3.  Pt will be able to complete UB dressing Mod I. Baseline:  Goal status: IN PROGRESS  4.  Pt will be able to complete LB dressing with supervision/setup. Baseline:  Goal status: IN PROGRESS  5.  Pt will be able to grasp 1" block with RUE to demonstrate improved functional use of RUE to progress towards engagement in objective assessment of box and blocks. Baseline:  Goal status: IN PROGRESS   ASSESSMENT:  CLINICAL IMPRESSION: Pt demonstrates great advances in [place] of RUE this session and is making much improved progress toward functional goals. Treatment today continued to focus on AAROM for NMR of RUE, with incorporation of various methods and positioning to facilitate success w/ activation and control. OT also introduced FMC/isolated hand and finger exercises due to improvements observed in grasp/release. Pt continues to be extremely motivated to improve w/ increased success during exercises due to multimodal cues, extended time, repetition, and OT demonstration/modeling. Patient aphasic but responded well with repetition and gestural and directional cues.  Pt requiring increased time and gestures to communicate with therapist.   PERFORMANCE DEFICITS in functional skills including ADLs, IADLs, coordination, dexterity, proprioception, sensation, ROM, strength, pain, FMC, GMC, mobility, balance, body mechanics, endurance, decreased knowledge of precautions, decreased knowledge of use of DME, skin integrity, and UE functional use, cognitive skills including emotional, sequencing, thought, and understand, and psychosocial skills including environmental adaptation.   IMPAIRMENTS are  limiting patient from ADLs, IADLs, and social participation.   COMORBIDITIES may have co-morbidities  that affects occupational performance. Patient will benefit from skilled OT to address above impairments and improve overall function.   PLAN: OT FREQUENCY: 2x/week  OT DURATION: 8 weeks  PLANNED INTERVENTIONS: self care/ADL training, therapeutic exercise, therapeutic activity, neuromuscular re-education, manual therapy, passive range of motion, balance training, functional mobility training, splinting, moist heat, cryotherapy, patient/family education, cognitive remediation/compensation, visual/perceptual remediation/compensation, psychosocial skills training, energy conservation, coping strategies training, and DME and/or AE instructions  RECOMMENDED OTHER SERVICES: SLP  CONSULTED AND AGREED WITH PLAN OF CARE: Patient and family member/caregiver  PLAN FOR NEXT SESSION:  Complete recertification; review and update HEP for improved muscle activation; WB w/ significant shoulder support prn   Kathrine Cords, OTR/L 10/24/2021, 5:29 PM

## 2021-10-27 DIAGNOSIS — Z9221 Personal history of antineoplastic chemotherapy: Secondary | ICD-10-CM | POA: Diagnosis not present

## 2021-10-27 DIAGNOSIS — C719 Malignant neoplasm of brain, unspecified: Secondary | ICD-10-CM | POA: Diagnosis not present

## 2021-10-27 DIAGNOSIS — C711 Malignant neoplasm of frontal lobe: Secondary | ICD-10-CM | POA: Diagnosis not present

## 2021-10-27 DIAGNOSIS — Z923 Personal history of irradiation: Secondary | ICD-10-CM | POA: Diagnosis not present

## 2021-10-29 DIAGNOSIS — C719 Malignant neoplasm of brain, unspecified: Secondary | ICD-10-CM | POA: Diagnosis not present

## 2021-10-29 DIAGNOSIS — F10129 Alcohol abuse with intoxication, unspecified: Secondary | ICD-10-CM | POA: Diagnosis not present

## 2021-10-29 DIAGNOSIS — C711 Malignant neoplasm of frontal lobe: Secondary | ICD-10-CM | POA: Diagnosis not present

## 2021-10-31 ENCOUNTER — Ambulatory Visit: Payer: 59 | Admitting: Occupational Therapy

## 2021-10-31 DIAGNOSIS — I69851 Hemiplegia and hemiparesis following other cerebrovascular disease affecting right dominant side: Secondary | ICD-10-CM

## 2021-10-31 DIAGNOSIS — R278 Other lack of coordination: Secondary | ICD-10-CM | POA: Diagnosis not present

## 2021-10-31 DIAGNOSIS — R2689 Other abnormalities of gait and mobility: Secondary | ICD-10-CM | POA: Diagnosis not present

## 2021-10-31 DIAGNOSIS — R41840 Attention and concentration deficit: Secondary | ICD-10-CM

## 2021-10-31 DIAGNOSIS — M6281 Muscle weakness (generalized): Secondary | ICD-10-CM

## 2021-10-31 DIAGNOSIS — R2681 Unsteadiness on feet: Secondary | ICD-10-CM | POA: Diagnosis not present

## 2021-10-31 NOTE — Therapy (Signed)
OUTPATIENT OCCUPATIONAL THERAPY TREATMENT NOTE  Patient Name: Kristen Foley MRN: [id number] DOB:14-Dec-1965, 56 y.o., female Today's Date: 11/01/2021  PCP: Unk Pinto, MD REFERRING PROVIDER: Holzmacher, [place]ia [place], NP    OT End of Session - 10/31/21 1552     Visit Number 14    Number of Visits 25    Date for OT Re-Evaluation 10/26/21    Authorization Type [employer]    Authorization - Visit Number 26    Authorization - Number of Visits 35   combined OT/PT/ST   OT Start Time 1450    OT Stop Time 3903    OT Time Calculation (min) 45 min    Activity Tolerance Patient tolerated treatment well    Behavior During Therapy WFL for tasks assessed/performed             Past Medical History:  Diagnosis Date   Allergy    Anemia    Past Surgical History:  Procedure Laterality Date   [place]   Patient Active Problem List   Diagnosis Date Noted   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   Expressive aphasia 11/26/2019   Seizures ([place]) 05/31/2019   Hypoalbuminemia 05/31/2019   Hypomagnesemia    Numbness and tingling of right leg 05/30/2019   Speech disturbance 05/30/2019   Transient alteration of awareness 05/30/2019   Fatigue 03/18/2018   FH: hypertension 03/18/2018   Abnormal glucose 03/18/2018   Hyperlipidemia, mixed 03/18/2018   Elevated BP without diagnosis of hypertension 03/18/2018   Vitamin D deficiency 03/18/2018    ONSET DATE: symptoms beginning around mid-April 2023, referral date 08/23/21  REFERRING DIAG: C71.9 (ICD-10-CM) - Malignant neoplasm of brain, unspecified   THERAPY DIAG:  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 No  PATIENT GOALS to be independent   OBJECTIVE:   UPPER EXTREMITY ROM      Active ROM Right eval Left eval 10/31/21  Shoulder flexion trace WNL 108  Shoulder abduction     45  Shoulder adduction       Shoulder extension       Shoulder internal rotation   WNL   Shoulder external rotation   WNL   Elbow flexion   WNL 85  Elbow extension   WNL -10  Wrist flexion       Wrist extension       Wrist ulnar deviation       Wrist radial deviation       Wrist pronation       Wrist supination       (Blank rows = not tested)   COORDINATION: Eval: Unable to assess due to no active movement in dominant RUE 10/31/21 Box and Blocks: Right: 4 blocks over box ledge and not over barrier    TODAY'S TREAMENT:  Pt arrives demonstrating increased active shoulder ROM and bringing hand to mouth.  Pt and spouse report that pt was  able to feed herself her breakfast bar with R hand this morning! Engaged in coordination and ROM assessments this session due to increased functional use of RUE.  See above for measurements and coordination. Engaged in AROM with picking up hand from table top and moving it on and off target to facilitate increased purposeful activation and to progress towards more purposeful engagement in table top tasks.  Pt demonstrating overflow movements with extension of RLE when attempting to increase use of RUE.  Educated on various bilateral UE tasks to continue to facilitate functional carryover.  Discussed folding towels with BUE, however pt demonstrating decreased motor planning and ability to complete.   Pt was  able to pick cup up from table top in R hand, stack in on top of other cup, and rotate from upside down to right side up with increased effort and time.  Pt able to pick up and bring small cup of water to mouth with increased effort and min cues to ensure full hand placement/grasp on cup.  Educated on modifications of tasks and equipment to increase success.  Discussed attempting to pick up various self-care items with R hand, even if still needing to use L hand to complete task (such as picking up toothpaste with R hand but then brushing teeth with L hand) and use of small, lightweight cup with lid and straw to increase functional use of RUE while decreasing risk of spilling.     PATIENT EDUCATION: Ongoing condition-specific education, particularly related to neuro reed and motor learning, as well as benefit of incorporating RUE as much as possible during functional activities (safely).  Discussed modifications of tasks and/or equipment to increase success. Person educated: Patient and Spouse Education method: Explanation Education comprehension: verbalized understanding and needs further education   HOME EXERCISE PROGRAM: Provided with Self-ROM exercises and handout  Medbridge Code: [id number] - Seated Shoulder Shrugs  - 5-10 reps - Seated Scapular Retraction  - 5-10 reps - Sidelying Scapular Retraction  - 1-2 sets - 10 reps - Sidelying Shoulder Flexion  - 1-2 sets - 10 reps - Sidelying Shoulder External Rotation  - 1-2 sets - 10 reps - Sidelying Shoulder Abduction  - 1-2 sets - 10 reps - Towel Slides in Various Directions at Counter  - 1-2 sets - 10 reps - Hand AROM Composite Flexion  - 20 reps - Thumb Opposition  - 1 sets - 10 reps - Seated Finger MP Extension AROM with Blocking  - 1 sets - 10 reps - Finger Spreading  - 15 reps  GOALS: Goals reviewed with patient? Yes  SHORT TERM GOALS: Target date: 09/28/21  Pt will be independent with HEP to address RUE ROM. Baseline: Goal  status: MET  2.  Pt will be able to demonstrate increased awareness of positioning of RUE during functional tasks and ability to compensate for decreased sensation/proprioception. Baseline:  Goal status: MET  3.  Pt will be able to utilize dominant RUE as gross assist during self-care tasks. Baseline:  Goal status: MET  4.  Pt will be able to complete UB dressing with setup assist only. Baseline:  Goal status: PARTIALLY MET - Pt is able to don pull over shirt without assistance, does still require assistance with donning bra   LONG TERM GOALS: Target date: 10/26/21  Pt and spouse will verbalize understanding of adapted strategies and utilized DME/AE as needed to maximize safety and I with ADLs/ IADLs . Baseline:  Goal status: IN PROGRESS  2.  Pt will  be able to utilize dominant RUE at diminished level during self-care tasks. Baseline:  Goal status: IN PROGRESS - Spouse reports utilizing RUE as gross assist ~10% of tasks, now utilizing it to pull up pants and just today started feeding herself finger foods with R hand.  3.  Pt will be able to complete UB dressing Mod I. Baseline:  Goal status: IN PROGRESS - continues to require assist to fasten bra, otherwise Mod I with UB dressing.  Have discussed sports bra but not using  4.  Pt will be able to complete LB dressing with supervision/setup. Baseline:  Goal status: IN PROGRESS - spouse only assisting with tying shoes.  5.  Pt will be able to grasp 1" block with RUE to demonstrate improved functional use of RUE to progress towards engagement in objective assessment of box and blocks. Baseline:  Goal status: MET - Pt able to move 4 blocks across edge of box in 60 seconds, was unable to lift RUE high enough and then release grasp to transfer blocks across barrier  Long Term goals: 11/30/21    Status:  1 Pt will be able to utilize RUE at diminished level during self-feeding. Baseline:  Initial  2 Pt will be able to utilize dominant  RUE at diminished level during self-care tasks. Baseline:  Initial  3 Pt will be able to pick up and bring a small cup to mouth without spilling 3 of 5 times. Baseline:  Initial  4 Pt will demonstrate improved ROm and strength to be able to place light weight object into cabinet at mid range. Baseline:  Initial  5 Pt will be able to utilize RUE at diminished level to fasten shoe laces with use of AE as needed. Baseline:  Initial      ASSESSMENT:  CLINICAL IMPRESSION: Pt demonstrates great advances in [place] of RUE this session and is making much improved progress toward functional goals. Pt has progress from trace activation in RUE to improved shoulder flexion and even gross grasp to hold on to cup and bring to mouth.  Pt continues to require assistance with [facility] such as fastening bra and tying shoes as well as increased incorporation of RUE into self-care tasks.  Treatment today continued to focus on AAROM for NMR of RUE, with incorporation of various methods and positioning to facilitate success w/ activation and control. OT also reiterated FMC/isolated hand and finger exercises due to improvements observed in grasp/release. Pt continues to be extremely motivated to improve w/ increased success during exercises due to multimodal cues, extended time, repetition, and OT demonstration/modeling. Patient aphasic but responded well with repetition and gestural and directional cues.  Pt requiring increased time and gestures to communicate with therapist.   PERFORMANCE DEFICITS in functional skills including ADLs, IADLs, coordination, dexterity, proprioception, sensation, ROM, strength, pain, FMC, GMC, mobility, balance, body mechanics, endurance, decreased knowledge of precautions, decreased knowledge of use of DME, skin integrity, and UE functional use, cognitive skills including emotional, sequencing, thought, and understand, and psychosocial skills including environmental adaptation.   IMPAIRMENTS are  limiting patient from ADLs, IADLs, and social participation.   COMORBIDITIES may have co-morbidities  that affects occupational performance. Patient will benefit from skilled OT to address above impairments and improve overall function.   PLAN: OT FREQUENCY: 1-2x/week  OT DURATION: 4 weeks  PLANNED INTERVENTIONS: self care/ADL training, therapeutic exercise, therapeutic activity, neuromuscular re-education, manual therapy, passive range of motion, balance training, functional mobility training, splinting, moist heat, cryotherapy, patient/family education, cognitive remediation/compensation, visual/perceptual  remediation/compensation, psychosocial skills training, energy conservation, coping strategies training, and DME and/or AE instructions  RECOMMENDED OTHER SERVICES: SLP  CONSULTED AND AGREED WITH PLAN OF CARE: Patient and family member/caregiver  PLAN FOR NEXT SESSION:  Engage in functional tasks such as grasp/release and functional reach as able, review and update HEP for improved muscle activation; Continue WB w/ significant shoulder support prn   Dreonna Hussein, OTR/L 11/01/2021, 5:29 PM

## 2021-11-06 ENCOUNTER — Other Ambulatory Visit: Payer: Self-pay

## 2021-11-06 ENCOUNTER — Other Ambulatory Visit: Payer: 59

## 2021-11-06 DIAGNOSIS — Z79899 Other long term (current) drug therapy: Secondary | ICD-10-CM

## 2021-11-07 ENCOUNTER — Ambulatory Visit: Payer: 59 | Attending: Adult Health Nurse Practitioner | Admitting: Occupational Therapy

## 2021-11-07 DIAGNOSIS — I69851 Hemiplegia and hemiparesis following other cerebrovascular disease affecting right dominant side: Secondary | ICD-10-CM | POA: Insufficient documentation

## 2021-11-07 DIAGNOSIS — R41840 Attention and concentration deficit: Secondary | ICD-10-CM | POA: Diagnosis not present

## 2021-11-07 DIAGNOSIS — R278 Other lack of coordination: Secondary | ICD-10-CM | POA: Insufficient documentation

## 2021-11-07 DIAGNOSIS — R2681 Unsteadiness on feet: Secondary | ICD-10-CM | POA: Insufficient documentation

## 2021-11-07 DIAGNOSIS — M6281 Muscle weakness (generalized): Secondary | ICD-10-CM | POA: Diagnosis not present

## 2021-11-07 LAB — CBC WITH DIFFERENTIAL/PLATELET
Absolute Monocytes: 595 cells/uL (ref 200–950)
Basophils Absolute: 13 cells/uL (ref 0–200)
Basophils Relative: 0.2 %
Eosinophils Absolute: 0 cells/uL — ABNORMAL LOW (ref 15–500)
Eosinophils Relative: 0 %
HCT: 39.1 % (ref 35.0–45.0)
Hemoglobin: 13.4 g/dL (ref 11.7–15.5)
Lymphs Abs: 198 cells/uL — ABNORMAL LOW (ref 850–3900)
MCH: 36.2 pg — ABNORMAL HIGH (ref 27.0–33.0)
MCHC: 34.3 g/dL (ref 32.0–36.0)
MCV: 105.7 fL — ABNORMAL HIGH (ref 80.0–100.0)
MPV: 10 fL (ref 7.5–12.5)
Monocytes Relative: 9.3 %
Neutro Abs: 5594 cells/uL (ref 1500–7800)
Neutrophils Relative %: 87.4 %
Platelets: 153 10*3/uL (ref 140–400)
RBC: 3.7 10*6/uL — ABNORMAL LOW (ref 3.80–5.10)
RDW: 15.1 % — ABNORMAL HIGH (ref 11.0–15.0)
Total Lymphocyte: 3.1 %
WBC: 6.4 10*3/uL (ref 3.8–10.8)

## 2021-11-07 LAB — COMPLETE METABOLIC PANEL WITH GFR
AG Ratio: 2 (calc) (ref 1.0–2.5)
ALT: 15 U/L (ref 6–29)
AST: 17 U/L (ref 10–35)
Albumin: 3.9 g/dL (ref 3.6–5.1)
Alkaline phosphatase (APISO): 64 U/L (ref 37–153)
BUN: 25 mg/dL (ref 7–25)
CO2: 28 mmol/L (ref 20–32)
Calcium: 9.4 mg/dL (ref 8.6–10.4)
Chloride: 103 mmol/L (ref 98–110)
Creat: 0.9 mg/dL (ref 0.50–1.03)
Globulin: 2 g/dL (calc) (ref 1.9–3.7)
Glucose, Bld: 84 mg/dL (ref 65–99)
Potassium: 5.2 mmol/L (ref 3.5–5.3)
Sodium: 141 mmol/L (ref 135–146)
Total Bilirubin: 0.7 mg/dL (ref 0.2–1.2)
Total Protein: 5.9 g/dL — ABNORMAL LOW (ref 6.1–8.1)
eGFR: 76 mL/min/{1.73_m2} (ref >=60)

## 2021-11-07 NOTE — Therapy (Signed)
OUTPATIENT OCCUPATIONAL THERAPY TREATMENT NOTE  Patient Name: Kristen Foley MRN: [id number] DOB:12/06/1965, 56 y.o., female Today's Date: 11/07/2021  PCP: Unk Pinto, MD REFERRING PROVIDER: Holzmacher, [place]ia [place], NP    OT End of Session - 11/07/21 1455     Visit Number 15    Number of Visits 25    Date for OT Re-Evaluation 10/26/21    Authorization Type [employer]et[employer]    Authorization - Visit Number 27    Authorization - Number of Visits 35   combined OT/PT/ST   OT Start Time 8182    OT Stop Time 1535    OT Time Calculation (min) 44 min    Activity Tolerance Patient tolerated treatment well    Behavior During Therapy [facility] for tasks assessed/performed              Past Medical History:  Diagnosis Date   Allergy    Anemia    Past Surgical History:  Procedure Laterality Date   [place]   Patient Active Problem List   Diagnosis Date Noted   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   Expressive aphasia 11/26/2019   Seizures ([place]) 05/31/2019   Hypoalbuminemia 05/31/2019   Hypomagnesemia    Numbness and tingling of right leg 05/30/2019   Speech disturbance 05/30/2019   Transient alteration of awareness 05/30/2019   Fatigue 03/18/2018   FH: hypertension 03/18/2018   Abnormal glucose 03/18/2018   Hyperlipidemia, mixed 03/18/2018   Elevated BP without diagnosis of hypertension 03/18/2018   Vitamin D deficiency 03/18/2018    ONSET DATE: symptoms beginning around mid-April 2023, referral date 08/23/21  REFERRING DIAG: C71.9 (ICD-10-CM) - Malignant neoplasm of brain, unspecified   THERAPY DIAG:  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 No  PATIENT GOALS to be independent   OBJECTIVE:   UPPER EXTREMITY ROM      Active ROM Right eval Left eval 10/31/21  Shoulder flexion trace WNL 108  Shoulder abduction     45  Shoulder adduction       Shoulder extension       Shoulder internal rotation   WNL   Shoulder external rotation   WNL   Elbow flexion   WNL 85  Elbow extension   WNL -10  Wrist flexion       Wrist extension       Wrist ulnar deviation       Wrist radial deviation       Wrist pronation       Wrist supination       (Blank rows = not tested)   COORDINATION: Eval: Unable to assess due to no active movement in dominant RUE 10/31/21 Box and Blocks: Right: 4 blocks over box ledge and not over barrier    TODAY'S TREAMENT:  Peg board pattern replication with RUE.  Pt requiring increased time to pick up and then  even more time and effort when attempting to push large grip pegs into resistive peg board peg holes. Pt with difficulty pushing pegs fully into peg hole.  Therapist increased challenge to having pt follow printed pattern.  Pt initially requires max verbal, demonstration, and item by item cues to facilitate engagement in task.  Therapist fading cues for pattern replication with pt able to recognize item by item and complete remainder of pattern (~50%) without any cues.  Pt demonstrating moderate grasp and good release of pegs. Engaged in functional reach of RUE in all directions in sitting progressing to standing.  Pt demonstrates full elbow extension but shoulder flexion to ~85* when reaching across midline and outside  BOS.  Therapist increased challenge to having pt attempt to toss bean bags to corresponding colored dots on floor.  Pt able to reach in all directions with min lean to obtain item.  However pt with difficulty with tossing bags.  Pt able to reach towards colored dots but would have to "release" bean bag instead of "toss".  With increased repetition, pt demonstrating increased difficulty with release at all of bean bags.   Therapist attempted to direct pt in shoulder shrugs and rolls to relax and "reset", however pt unable to complete due to decreased motor planning of movement.  Educated on stopping a task and allowing time to reset if pt demonstrates decreased ability to complete task, as she fatigues. Engaged in BUE shoulder flexion, chest press, and horizontal abduction/adduction with dowel for increased symmetry.  Pt initially requiring hand over hand fading to supervision to complete tasks as directed.  Pt demonstrating shoulder flexion ~115* with use of BUE on dowel.  Educated on completing these exercises in sitting or supine.     PATIENT EDUCATION: Ongoing condition-specific education, particularly related to neuro reed and motor learning, as well as benefit of incorporating RUE as much as possible during functional activities (safely).  Discussed modifications of tasks and/or equipment to increase success. Person educated: Patient and Spouse Education method: Explanation Education comprehension: verbalized understanding and needs further education   HOME EXERCISE PROGRAM: Provided with Self-ROM exercises and handout  Medbridge Code: [id number] - Seated Shoulder Shrugs  - 5-10 reps - Seated Scapular Retraction  - 5-10 reps - Sidelying Scapular Retraction  - 1-2 sets - 10 reps - Sidelying Shoulder Flexion  - 1-2 sets - 10 reps - Sidelying Shoulder External Rotation  - 1-2 sets - 10 reps - Sidelying Shoulder Abduction  - 1-2 sets - 10 reps - Towel Slides in Various Directions at Counter  -  1-2 sets - 10 reps - Hand AROM Composite Flexion  - 20 reps - Thumb Opposition  - 1 sets - 10 reps - Seated Finger MP Extension AROM with Blocking  - 1 sets - 10 reps - Finger Spreading  - 15 reps  GOALS: Goals reviewed with patient? Yes  SHORT TERM GOALS: Target date: 09/28/21  Pt will be independent with HEP to address RUE ROM. Baseline: Goal status: MET  2.  Pt will be able to demonstrate increased awareness of positioning of RUE during functional tasks and ability to compensate for decreased sensation/proprioception. Baseline:  Goal status: MET  3.  Pt will be able to utilize dominant RUE as gross assist during self-care tasks. Baseline:  Goal status: MET  4.  Pt will be able to complete UB dressing with setup assist only. Baseline:  Goal status: PARTIALLY MET - Pt is able to don pull over shirt without assistance,  does still require assistance with donning bra   LONG TERM GOALS: Target date: 11/30/21    Status:  1 Pt will be able to utilize RUE at diminished level during self-feeding. Baseline:  Initial  2 Pt will be able to utilize dominant RUE at diminished level during self-care tasks. Baseline:  Initial  3 Pt will be able to pick up and bring a small cup to mouth without spilling 3 of 5 times. Baseline:  Initial  4 Pt will demonstrate improved ROm and strength to be able to place light weight object into cabinet at mid range. Baseline:  Initial  5 Pt will be able to utilize RUE at diminished level to fasten shoe laces with use of AE as needed. Baseline:  Initial      ASSESSMENT:  CLINICAL IMPRESSION: Pt demonstrates great advances in [place] [facility] of RUE this session. Treatment today continued to focus on AAROM and AROM for NMR of RUE, with incorporation of various methods and positioning to facilitate success w/ activation and control. OT also reiterated FMC/isolated hand and finger activity with improved grasp, manipulation, and coordination to place and remove  large grip pegs from peg board.  Pt demonstrating increased active shoulder ROM, however demonstrating decreased ability to "toss" when releasing bean bags and as she fatigued demonstrating increased difficulty with release of grasp from items. Pt continues to be extremely motivated to improve w/ increased success during exercises due to multimodal cues, extended time, repetition, and OT demonstration/modeling. Patient aphasic but responded well with repetition and gestural and directional cues.  Pt requiring increased time and gestures to communicate with therapist.   PERFORMANCE DEFICITS in functional skills including ADLs, IADLs, coordination, dexterity, proprioception, sensation, ROM, strength, pain, FMC, GMC, mobility, balance, body mechanics, endurance, decreased knowledge of precautions, decreased knowledge of use of DME, skin integrity, and UE functional use, cognitive skills including emotional, sequencing, thought, and understand, and psychosocial skills including environmental adaptation.   IMPAIRMENTS are limiting patient from ADLs, IADLs, and social participation.   COMORBIDITIES may have co-morbidities  that affects occupational performance. Patient will benefit from skilled OT to address above impairments and improve overall function.   PLAN: OT FREQUENCY: 1-2x/week  OT DURATION: 4 weeks  PLANNED INTERVENTIONS: self care/ADL training, therapeutic exercise, therapeutic activity, neuromuscular re-education, manual therapy, passive range of motion, balance training, functional mobility training, splinting, moist heat, cryotherapy, patient/family education, cognitive remediation/compensation, visual/perceptual remediation/compensation, psychosocial skills training, energy conservation, coping strategies training, and DME and/or AE instructions  RECOMMENDED OTHER SERVICES: SLP  CONSULTED AND AGREED WITH PLAN OF CARE: Patient and family member/caregiver  PLAN FOR NEXT SESSION:  Engage  in functional tasks such as grasp/release and functional reach as able, review and update HEP for improved muscle activation; Continue WB w/ significant shoulder support prn.  May attempt tall kneeling vs quadruped to pt tolerance.   [name], OTR/L 11/07/2021, 5:29 PM

## 2021-11-07 NOTE — Progress Notes (Signed)
<><><><><><><><><><><><><><><><><><><><><><><><><><><><><><><><><> <><><><><><><><><><><><><><><><><><><><><><><><><><><><><><><><><>  -   Labs Stable & OK   <><><><><><><><><><><><><><><><><><><><><><><><><><><><><><><><><> <><><><><><><><><><><><><><><><><><><><><><><><><><><><><><><><><>

## 2021-11-12 DIAGNOSIS — C719 Malignant neoplasm of brain, unspecified: Secondary | ICD-10-CM | POA: Diagnosis not present

## 2021-11-12 DIAGNOSIS — C711 Malignant neoplasm of frontal lobe: Secondary | ICD-10-CM | POA: Diagnosis not present

## 2021-11-14 ENCOUNTER — Ambulatory Visit: Payer: 59 | Admitting: Occupational Therapy

## 2021-11-14 DIAGNOSIS — R278 Other lack of coordination: Secondary | ICD-10-CM | POA: Diagnosis not present

## 2021-11-14 DIAGNOSIS — M6281 Muscle weakness (generalized): Secondary | ICD-10-CM | POA: Diagnosis not present

## 2021-11-14 DIAGNOSIS — I69851 Hemiplegia and hemiparesis following other cerebrovascular disease affecting right dominant side: Secondary | ICD-10-CM

## 2021-11-14 DIAGNOSIS — R2681 Unsteadiness on feet: Secondary | ICD-10-CM | POA: Diagnosis not present

## 2021-11-14 DIAGNOSIS — R41840 Attention and concentration deficit: Secondary | ICD-10-CM

## 2021-11-14 NOTE — Therapy (Signed)
OUTPATIENT OCCUPATIONAL THERAPY TREATMENT NOTE  Patient Name: Kristen Foley MRN: [id number] DOB:10/24/1965, 56 y.o., female Today's Date: 11/14/2021  PCP: Unk Pinto, MD REFERRING PROVIDER: Holzmacher, Mia [place], NP    OT End of Session - 11/14/21 1459     Visit Number 16    Number of Visits 25    Date for OT Re-Evaluation 10/26/21    Authorization Type [employer]    Authorization - Visit Number 28    Authorization - Number of Visits 35   combined OT/PT/ST   OT Start Time 3664    OT Stop Time 1449    OT Time Calculation (min) 46 min    Activity Tolerance Patient tolerated treatment well    Behavior During Therapy [facility] for tasks assessed/performed               Past Medical History:  Diagnosis Date   Allergy    Anemia    Past Surgical History:  Procedure Laterality Date   [place]   Patient Active Problem List   Diagnosis Date Noted   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   Expressive aphasia 11/26/2019   Seizures ([place]) 05/31/2019   Hypoalbuminemia 05/31/2019   Hypomagnesemia    Numbness and tingling of right leg 05/30/2019   Speech disturbance 05/30/2019   Transient alteration of awareness 05/30/2019   Fatigue 03/18/2018   FH: hypertension 03/18/2018   Abnormal glucose 03/18/2018   Hyperlipidemia, mixed 03/18/2018   Elevated BP without diagnosis of hypertension 03/18/2018   Vitamin D deficiency 03/18/2018    ONSET DATE: symptoms beginning around mid-April 2023, referral date 08/23/21  REFERRING DIAG: C71.9 (ICD-10-CM) - Malignant neoplasm of brain, unspecified   THERAPY DIAG:  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 No  PATIENT GOALS to be independent   OBJECTIVE:   UPPER EXTREMITY ROM      Active ROM Right eval Left eval 10/31/21  Shoulder flexion trace WNL 108  Shoulder abduction     45  Shoulder adduction       Shoulder extension       Shoulder internal rotation   WNL   Shoulder external rotation   WNL   Elbow flexion   WNL 85  Elbow extension   WNL -10  Wrist flexion       Wrist extension       Wrist ulnar deviation       Wrist radial deviation       Wrist pronation       Wrist supination       (Blank rows = not tested)   COORDINATION: Eval: Unable to assess due to no active movement in dominant RUE 10/31/21 Box and Blocks: Right: 4 blocks over box ledge and not over barrier    TODAY'S TREAMENT:  BUE ROM activity with hula hoop with focus on symmetrical and asymmetrical movements, completing chest press and then rotation.  Pt requiring hand over hand faded to mod cues for technique when completing rotation as pt with difficulty with elbow extension and shoulder external rotation.  Therapist providing hand over hand, demonstration, and verbal cues.  Pt demonstrating decreased release of grasp on hula hoop when letting go, requiring hand over hand and max cues to release grasp.   Diona Foley toss for increased focus on reactionary movements.  Engaged in ball toss with catch and release with pt demonstrating initial motor planning deficits, however with repetition pt demonstrating improved reactions and ability to catch and release.  Pt demonstrating decreased motor control when attempting to toss bean bags onto target, requiring hand over  hand, demonstration, and verbal cues. Simulated self-feeding with pouring from cup and scooping food with utensil.  Pt demonstrating fair motor planning with pouring from cup, overshooting 1 of 3 attempts and requiring increased time for elbow and wrist flexion.  Pt demonstrating decreased release of cup and spoon after task.  Pt scooping beans from bowl with R hand with increased effort but able to complete with min spillage.  Therapist providing max cues to visually attend to grasp when attempting to release grasp. Engaged in reaching into cabinet with focus on functional reach.  Pt demonstrating increased difficulty with functional aspects of tasks, despite ability to achieve nearly full ROM in shoulder.  Pt visibly frustrated by decreased grasp and dropping cups when removing from cabinet. Engaged in wall slides with cleaning mirror in flexion, abduction, and horizontal adduction.  Attempted rolling ball on wall, however pt demonstrating decreased motor planning therefore reiterated recommendation for wall slides with functional tasks of cleaning mirrors, windows, cabinets.   PATIENT EDUCATION: Ongoing condition-specific education, particularly related to neuro reed and motor learning, as well as benefit of incorporating RUE as much as possible during functional activities (safely).  Discussed modifications of tasks and/or equipment to increase success. Person educated: Patient and Spouse Education method: Explanation Education comprehension: verbalized understanding and needs further education   HOME EXERCISE PROGRAM: Provided with Self-ROM exercises and handout  Medbridge Code: [id number] - Seated Shoulder Shrugs  - 5-10 reps - Seated Scapular Retraction  - 5-10 reps - Sidelying Scapular Retraction  - 1-2 sets - 10 reps - Sidelying Shoulder Flexion  - 1-2 sets - 10 reps - Sidelying Shoulder External Rotation  - 1-2 sets - 10 reps - Sidelying Shoulder Abduction  - 1-2 sets - 10 reps - Towel  Slides in Various Directions at Counter  - 1-2 sets - 10 reps - Hand AROM Composite Flexion  - 20 reps - Thumb Opposition  - 1 sets - 10 reps - Seated Finger MP Extension AROM with Blocking  - 1 sets - 10 reps - Finger Spreading  - 15 reps  GOALS: Goals reviewed with patient? Yes  SHORT TERM GOALS: Target date: 09/28/21  Pt will be independent with HEP to address RUE ROM. Baseline: Goal status: MET  2.  Pt will be able to demonstrate increased awareness of positioning of RUE during functional tasks and ability to compensate for decreased sensation/proprioception. Baseline:  Goal status: MET  3.  Pt will be able to utilize dominant RUE as gross assist during self-care tasks. Baseline:  Goal status: MET  4.  Pt will be able to complete UB dressing with setup assist only. Baseline:  Goal status: PARTIALLY MET - Pt is able to don pull over shirt without assistance, does still require assistance with donning bra   LONG TERM GOALS: Target date: 11/30/21  Status:  1 Pt will be able to utilize RUE at diminished level during self-feeding. Baseline:  Progressing  2 Pt will be able to utilize dominant RUE at diminished level during self-care tasks. Baseline:  Progressing  3 Pt will be able to pick up and bring a small cup to mouth without spilling 3 of 5 times. Baseline:  Progressing  4 Pt will demonstrate improved ROm and strength to be able to place light weight object into cabinet at mid range. Baseline:  Progressing  5 Pt will be able to utilize RUE at diminished level to fasten shoe laces with use of AE as needed. Baseline:  Progressing      ASSESSMENT:  CLINICAL IMPRESSION: Pt continues to demonstrate great advances in [facility] and [place] of RUE. Treatment today focused on increased functional use of RUE as needed to engage in self-care tasks, self-feeding, and simple housekeeping tasks.  Pt demonstrating increased active shoulder and elbow ROM, however demonstrating difficulty  with release on objects during reactionary tasks and functional tasks. Focus placed on functional carryover of exercises and cues for visual attention to RUE during functional tasks to increase motor control and release of objects.  Pt continues to be extremely motivated to improve w/ increased success during exercises due to multimodal cues, extended time, repetition, and OT demonstration/modeling. Patient aphasic but responded well with repetition and gestural and directional cues.  Pt requiring increased time and gestures to communicate with therapist.   PERFORMANCE DEFICITS in functional skills including ADLs, IADLs, coordination, dexterity, proprioception, sensation, ROM, strength, pain, FMC, GMC, mobility, balance, body mechanics, endurance, decreased knowledge of precautions, decreased knowledge of use of DME, skin integrity, and UE functional use, cognitive skills including emotional, sequencing, thought, and understand, and psychosocial skills including environmental adaptation.   IMPAIRMENTS are limiting patient from ADLs, IADLs, and social participation.   COMORBIDITIES may have co-morbidities  that affects occupational performance. Patient will benefit from skilled OT to address above impairments and improve overall function.   PLAN: OT FREQUENCY: 1-2x/week  OT DURATION: 4 weeks  PLANNED INTERVENTIONS: self care/ADL training, therapeutic exercise, therapeutic activity, neuromuscular re-education, manual therapy, passive range of motion, balance training, functional mobility training, splinting, moist heat, cryotherapy, patient/family education, cognitive remediation/compensation, visual/perceptual remediation/compensation, psychosocial skills training, energy conservation, coping strategies training, and DME and/or AE instructions  RECOMMENDED OTHER SERVICES: SLP  CONSULTED AND AGREED WITH PLAN OF CARE: Patient and family member/caregiver  PLAN FOR NEXT SESSION:  Engage in functional  tasks such as grasp/release and functional reach as able, review and update HEP for improved muscle activation; Continue WB w/ significant shoulder support prn.  May attempt tall kneeling vs quadruped to pt tolerance.   [name], OTR/L 11/14/2021, 5:29 PM

## 2021-11-20 ENCOUNTER — Other Ambulatory Visit: Payer: Self-pay

## 2021-11-20 ENCOUNTER — Other Ambulatory Visit: Payer: 59

## 2021-11-20 DIAGNOSIS — Z79899 Other long term (current) drug therapy: Secondary | ICD-10-CM

## 2021-11-21 ENCOUNTER — Ambulatory Visit: Payer: 59 | Admitting: Occupational Therapy

## 2021-11-21 DIAGNOSIS — R41840 Attention and concentration deficit: Secondary | ICD-10-CM | POA: Diagnosis not present

## 2021-11-21 DIAGNOSIS — M6281 Muscle weakness (generalized): Secondary | ICD-10-CM

## 2021-11-21 DIAGNOSIS — I69851 Hemiplegia and hemiparesis following other cerebrovascular disease affecting right dominant side: Secondary | ICD-10-CM | POA: Diagnosis not present

## 2021-11-21 DIAGNOSIS — R278 Other lack of coordination: Secondary | ICD-10-CM

## 2021-11-21 DIAGNOSIS — R2681 Unsteadiness on feet: Secondary | ICD-10-CM | POA: Diagnosis not present

## 2021-11-21 LAB — COMPLETE METABOLIC PANEL WITH GFR
AG Ratio: 1.8 (calc) (ref 1.0–2.5)
ALT: 16 U/L (ref 6–29)
AST: 20 U/L (ref 10–35)
Albumin: 3.6 g/dL (ref 3.6–5.1)
Alkaline phosphatase (APISO): 64 U/L (ref 37–153)
BUN: 21 mg/dL (ref 7–25)
CO2: 26 mmol/L (ref 20–32)
Calcium: 8.9 mg/dL (ref 8.6–10.4)
Chloride: 105 mmol/L (ref 98–110)
Creat: 0.83 mg/dL (ref 0.50–1.03)
Globulin: 2 g/dL (calc) (ref 1.9–3.7)
Glucose, Bld: 94 mg/dL (ref 65–99)
Potassium: 4.7 mmol/L (ref 3.5–5.3)
Sodium: 141 mmol/L (ref 135–146)
Total Bilirubin: 0.6 mg/dL (ref 0.2–1.2)
Total Protein: 5.6 g/dL — ABNORMAL LOW (ref 6.1–8.1)
eGFR: 84 mL/min/{1.73_m2} (ref >=60)

## 2021-11-21 LAB — CBC WITH DIFFERENTIAL/PLATELET
Absolute Monocytes: 281 cells/uL (ref 200–950)
Basophils Absolute: 7 cells/uL (ref 0–200)
Basophils Relative: 0.1 %
Eosinophils Absolute: 0 cells/uL — ABNORMAL LOW (ref 15–500)
Eosinophils Relative: 0 %
HCT: 35.1 % (ref 35.0–45.0)
Hemoglobin: 12.3 g/dL (ref 11.7–15.5)
Lymphs Abs: 259 cells/uL — ABNORMAL LOW (ref 850–3900)
MCH: 36.6 pg — ABNORMAL HIGH (ref 27.0–33.0)
MCHC: 35 g/dL (ref 32.0–36.0)
MCV: 104.5 fL — ABNORMAL HIGH (ref 80.0–100.0)
MPV: 10.2 fL (ref 7.5–12.5)
Monocytes Relative: 3.9 %
Neutro Abs: 6653 cells/uL (ref 1500–7800)
Neutrophils Relative %: 92.4 %
Platelets: 119 10*3/uL — ABNORMAL LOW (ref 140–400)
RBC: 3.36 10*6/uL — ABNORMAL LOW (ref 3.80–5.10)
RDW: 14.9 % (ref 11.0–15.0)
Total Lymphocyte: 3.6 %
WBC: 7.2 10*3/uL (ref 3.8–10.8)

## 2021-11-21 NOTE — Progress Notes (Signed)
<><><><><><><><><><><><><><><><><><><><><><><><><><><><><><><><><> <><><><><><><><><><><><><><><><><><><><><><><><><><><><><><><><><>  -   Labs look OK to continue ongoing Chemo   <><><><><><><><><><><><><><><><><><><><><><><><><><><><><><><><><> <><><><><><><><><><><><><><><><><><><><><><><><><><><><><><><><><>

## 2021-11-21 NOTE — Therapy (Addendum)
OUTPATIENT OCCUPATIONAL THERAPY TREATMENT NOTE  Patient Name: Kristen Foley MRN: [id number] DOB:02-Sep-1965, 56 y.o., female Today's Date: 11/22/2021  PCP: Unk Pinto, MD REFERRING PROVIDER: Holzmacher, [place]ia [place], NP    OT End of Session - 11/21/21 1600     Visit Number 17    Number of Visits 25    Date for OT Re-Evaluation 11/30/21    Authorization Type Aetna    Authorization - Visit Number 29    Authorization - Number of Visits 35   combined OT/PT/ST   OT Start Time 3244    OT Stop Time 1448    OT Time Calculation (min) 44 min    Activity Tolerance Patient tolerated treatment well    Behavior During Therapy [facility] for tasks assessed/performed                Past Medical History:  Diagnosis Date   Allergy    Anemia    Past Surgical History:  Procedure Laterality Date   [place]   Patient Active Problem List   Diagnosis Date Noted   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   Expressive aphasia 11/26/2019   Seizures ([place]) 05/31/2019   Hypoalbuminemia 05/31/2019   Hypomagnesemia    Numbness and tingling of right leg 05/30/2019   Speech disturbance 05/30/2019   Transient alteration of awareness 05/30/2019   Fatigue 03/18/2018   FH: hypertension 03/18/2018   Abnormal glucose 03/18/2018   Hyperlipidemia, mixed 03/18/2018   Elevated BP without diagnosis of hypertension 03/18/2018   Vitamin D deficiency 03/18/2018    ONSET DATE: symptoms beginning around mid-April 2023, referral date 08/23/21  REFERRING DIAG: C71.9 (ICD-10-CM) - Malignant neoplasm of brain, unspecified   THERAPY DIAG:  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 No  PATIENT GOALS to be independent   OBJECTIVE:   UPPER EXTREMITY ROM      Active ROM Right eval Left eval 10/31/21  Shoulder flexion trace WNL 108  Shoulder abduction     45  Shoulder adduction       Shoulder extension       Shoulder internal rotation   WNL   Shoulder external rotation   WNL   Elbow flexion   WNL 85  Elbow extension   WNL -10  Wrist flexion       Wrist extension       Wrist ulnar deviation       Wrist radial deviation       Wrist pronation       Wrist supination       (Blank rows = not tested)   COORDINATION: Eval: Unable to assess due to no active movement in dominant RUE 10/31/21 Box and Blocks: Right: 4 blocks over box ledge and not over barrier    TODAY'S TREAMENT:  Engaged in WB through [place] with focus on NMR with open hand to facilitate grasp and release activity.  Engaged in variety of gross grasp and release activities with focus on release.  Pt demonstrates increased reach and ability to grasp variety of items, however continues to demonstrate decreased ability to release items.  Attempted tossing bean bags onto target, with pt demonstrating difficulty with release, therefore graded to attempting to "toss" into trash can.  Pt still with difficulty.  Transitioned to picking up and removing clothespins from vertical and horizontal dowel.  Pt able to squeeze to remove clothespins, but with difficulty with release.  With massed practice pt able to release ~50% of time when hand placed in "duck mouth" pinch vs full hand pinch.  Engaged in sliding bean bags off of mat table and cups  across table to focus on increased open hand and grading of pressure on items.  Pt requiring hand over hand and demonstration, fading to mod verbal cues to facilitate positioning and carryover of task.    PATIENT EDUCATION: Ongoing condition-specific education, particularly related to neuro reed and motor learning, as well as benefit of incorporating RUE as much as possible during functional activities (safely).  Discussed modifications of tasks and/or equipment to increase success. Person educated: Patient and Spouse Education method: Explanation Education comprehension: verbalized understanding and needs further education   HOME EXERCISE PROGRAM: Provided with Self-ROM exercises and handout  Medbridge Code: [id number] - Seated Shoulder Shrugs  - 5-10 reps - Seated Scapular Retraction  - 5-10 reps - Sidelying Scapular Retraction  - 1-2 sets - 10 reps - Sidelying Shoulder Flexion  - 1-2 sets - 10 reps - Sidelying Shoulder External Rotation  - 1-2 sets - 10 reps - Sidelying Shoulder Abduction  - 1-2 sets - 10 reps - Towel Slides in Various Directions at Counter  - 1-2 sets - 10 reps - Hand AROM Composite Flexion  - 20 reps - Thumb Opposition  - 1 sets - 10 reps - Seated Finger MP Extension AROM with Blocking  - 1 sets - 10 reps - Finger Spreading  - 15 reps  GOALS: Goals reviewed with patient? Yes  SHORT TERM GOALS: Target date: 09/28/21  Pt will be independent with HEP to address RUE ROM. Baseline: Goal status: MET  2.  Pt will be able to demonstrate increased awareness of positioning of RUE during functional tasks and ability to compensate for decreased sensation/proprioception. Baseline:  Goal status: MET  3.  Pt will be able to utilize dominant RUE as gross assist during self-care tasks. Baseline:  Goal status: MET  4.  Pt will be able to complete UB dressing with setup assist only. Baseline:  Goal status: PARTIALLY MET - Pt is able to don pull over shirt without  assistance, does still require assistance with donning bra   LONG TERM GOALS: Target date: 11/30/21    Status:  1 Pt will be able to utilize RUE at diminished level during self-feeding. Baseline:  Progressing  2 Pt will be able to utilize dominant RUE at diminished level during self-care tasks. Baseline:  Progressing  3 Pt will be able to pick up and bring a small cup to mouth without spilling 3 of 5 times. Baseline:  Progressing  4 Pt will demonstrate improved ROm and strength to be able to place light weight object into cabinet at mid range. Baseline:  Progressing  5 Pt will be able to utilize RUE at diminished level to fasten shoe laces with use of AE as needed. Baseline:  Progressing      ASSESSMENT:  CLINICAL IMPRESSION: Pt continues  to demonstrate great advances in [facility] and [place]. Treatment today focused on grasp and release.  Pt with good active grasp of variety of items, however demonstrates increased difficulty with release.  Pt demonstrating flexor synergy pattern with grasp, therapist assisting with extensor reaching to attempt to facilitate release.  Pt with improvements ~30% of time in extension, however unable to replicate between trials.  Focus placed on grading of tasks to more open hand movements and cues for visual attention to RUE during functional tasks to increase motor control and release of objects.  Pt continues to be extremely motivated to improve w/ increased success during exercises due to multimodal cues, extended time, repetition, and OT demonstration/modeling. Patient aphasic but responded well with repetition and gestural and directional cues.  Pt requiring increased time and gestures to communicate with therapist.   PERFORMANCE DEFICITS in functional skills including ADLs, IADLs, coordination, dexterity, proprioception, sensation, ROM, strength, pain, FMC, GMC, mobility, balance, body mechanics, endurance, decreased knowledge of precautions, decreased  knowledge of use of DME, skin integrity, and UE functional use, cognitive skills including emotional, sequencing, thought, and understand, and psychosocial skills including environmental adaptation.   IMPAIRMENTS are limiting patient from ADLs, IADLs, and social participation.   COMORBIDITIES may have co-morbidities  that affects occupational performance. Patient will benefit from skilled OT to address above impairments and improve overall function.   PLAN: OT FREQUENCY: 1-2x/week  OT DURATION: 4 weeks  PLANNED INTERVENTIONS: self care/ADL training, therapeutic exercise, therapeutic activity, neuromuscular re-education, manual therapy, passive range of motion, balance training, functional mobility training, splinting, moist heat, cryotherapy, patient/family education, cognitive remediation/compensation, visual/perceptual remediation/compensation, psychosocial skills training, energy conservation, coping strategies training, and DME and/or AE instructions  RECOMMENDED OTHER SERVICES: SLP  CONSULTED AND AGREED WITH PLAN OF CARE: Patient and family member/caregiver  PLAN FOR NEXT SESSION:  Engage in functional tasks such as grasp/release and functional reach as able, review and update HEP for improved muscle activation; Continue WB w/ significant shoulder support prn.  May attempt tall kneeling vs quadruped to pt tolerance.   [name], OTR/L 11/22/2021, 5:29 PM   OCCUPATIONAL THERAPY DISCHARGE SUMMARY  Visits from Start of Care: 17  Current functional level related to goals / functional outcomes: Pt was making great progress with functional use of RUE, however pt with another hospitalization and now receiving home health therapies due to level of care/assist needed.    Remaining deficits: Aphasia, dominant RUE hemiplegia, decreased purposeful release and reactionary movements   Education / Equipment: Educated on [employer] and WB for motor recovery, functional use of dominant RUE with  progressing engagement, HEP for ROM and coordination   Patient agrees to discharge. Patient goals were partially met. Patient is being discharged due to a change in medical status.Marland Kitchen

## 2021-11-23 ENCOUNTER — Encounter (HOSPITAL_COMMUNITY): Payer: Self-pay

## 2021-11-23 ENCOUNTER — Emergency Department (HOSPITAL_BASED_OUTPATIENT_CLINIC_OR_DEPARTMENT_OTHER): Payer: 59

## 2021-11-23 ENCOUNTER — Other Ambulatory Visit: Payer: Self-pay

## 2021-11-23 ENCOUNTER — Emergency Department (HOSPITAL_COMMUNITY): Payer: 59

## 2021-11-23 ENCOUNTER — Observation Stay (EMERGENCY_DEPARTMENT_HOSPITAL)
Admission: EM | Admit: 2021-11-23 | Discharge: 2021-11-24 | Disposition: A | Discharge status: Home / Self Care | Payer: 59 | Source: Home / Self Care | Attending: Emergency Medicine | Admitting: Emergency Medicine

## 2021-11-23 ENCOUNTER — Encounter (HOSPITAL_BASED_OUTPATIENT_CLINIC_OR_DEPARTMENT_OTHER): Payer: Self-pay | Admitting: Pharmacy Technician

## 2021-11-23 DIAGNOSIS — J9601 Acute respiratory failure with hypoxia: Secondary | ICD-10-CM | POA: Diagnosis not present

## 2021-11-23 DIAGNOSIS — T451X5A Adverse effect of antineoplastic and immunosuppressive drugs, initial encounter: Secondary | ICD-10-CM | POA: Diagnosis not present

## 2021-11-23 DIAGNOSIS — R29818 Other symptoms and signs involving the nervous system: Secondary | ICD-10-CM | POA: Diagnosis not present

## 2021-11-23 DIAGNOSIS — R944 Abnormal results of kidney function studies: Secondary | ICD-10-CM | POA: Diagnosis not present

## 2021-11-23 DIAGNOSIS — G40909 Epilepsy, unspecified, not intractable, without status epilepticus: Secondary | ICD-10-CM

## 2021-11-23 DIAGNOSIS — R6520 Severe sepsis without septic shock: Secondary | ICD-10-CM | POA: Diagnosis not present

## 2021-11-23 DIAGNOSIS — R4701 Aphasia: Secondary | ICD-10-CM | POA: Diagnosis not present

## 2021-11-23 DIAGNOSIS — Z85841 Personal history of malignant neoplasm of brain: Secondary | ICD-10-CM | POA: Insufficient documentation

## 2021-11-23 DIAGNOSIS — J90 Pleural effusion, not elsewhere classified: Secondary | ICD-10-CM | POA: Diagnosis not present

## 2021-11-23 DIAGNOSIS — D6869 Other thrombophilia: Secondary | ICD-10-CM | POA: Diagnosis not present

## 2021-11-23 DIAGNOSIS — Z888 Allergy status to other drugs, medicaments and biological substances status: Secondary | ICD-10-CM | POA: Diagnosis not present

## 2021-11-23 DIAGNOSIS — Z79899 Other long term (current) drug therapy: Secondary | ICD-10-CM | POA: Insufficient documentation

## 2021-11-23 DIAGNOSIS — D696 Thrombocytopenia, unspecified: Secondary | ICD-10-CM | POA: Diagnosis not present

## 2021-11-23 DIAGNOSIS — J811 Chronic pulmonary edema: Secondary | ICD-10-CM | POA: Diagnosis not present

## 2021-11-23 DIAGNOSIS — R911 Solitary pulmonary nodule: Secondary | ICD-10-CM | POA: Diagnosis not present

## 2021-11-23 DIAGNOSIS — Z20822 Contact with and (suspected) exposure to covid-19: Secondary | ICD-10-CM | POA: Insufficient documentation

## 2021-11-23 DIAGNOSIS — M79661 Pain in right lower leg: Secondary | ICD-10-CM | POA: Insufficient documentation

## 2021-11-23 DIAGNOSIS — E782 Mixed hyperlipidemia: Secondary | ICD-10-CM | POA: Diagnosis present

## 2021-11-23 DIAGNOSIS — I10 Essential (primary) hypertension: Secondary | ICD-10-CM | POA: Insufficient documentation

## 2021-11-23 DIAGNOSIS — G9341 Metabolic encephalopathy: Secondary | ICD-10-CM | POA: Diagnosis not present

## 2021-11-23 DIAGNOSIS — A419 Sepsis, unspecified organism: Secondary | ICD-10-CM | POA: Diagnosis not present

## 2021-11-23 DIAGNOSIS — R7989 Other specified abnormal findings of blood chemistry: Secondary | ICD-10-CM | POA: Diagnosis not present

## 2021-11-23 DIAGNOSIS — R569 Unspecified convulsions: Secondary | ICD-10-CM

## 2021-11-23 DIAGNOSIS — C711 Malignant neoplasm of frontal lobe: Secondary | ICD-10-CM | POA: Diagnosis not present

## 2021-11-23 DIAGNOSIS — R030 Elevated blood-pressure reading, without diagnosis of hypertension: Secondary | ICD-10-CM | POA: Diagnosis present

## 2021-11-23 DIAGNOSIS — G40109 Localization-related (focal) (partial) symptomatic epilepsy and epileptic syndromes with simple partial seizures, not intractable, without status epilepticus: Secondary | ICD-10-CM | POA: Insufficient documentation

## 2021-11-23 DIAGNOSIS — R000 Tachycardia, unspecified: Secondary | ICD-10-CM | POA: Diagnosis not present

## 2021-11-23 DIAGNOSIS — R509 Fever, unspecified: Secondary | ICD-10-CM | POA: Diagnosis not present

## 2021-11-23 DIAGNOSIS — E8809 Other disorders of plasma-protein metabolism, not elsewhere classified: Secondary | ICD-10-CM | POA: Diagnosis not present

## 2021-11-23 DIAGNOSIS — D61810 Antineoplastic chemotherapy induced pancytopenia: Secondary | ICD-10-CM | POA: Diagnosis not present

## 2021-11-23 DIAGNOSIS — C719 Malignant neoplasm of brain, unspecified: Secondary | ICD-10-CM

## 2021-11-23 DIAGNOSIS — J189 Pneumonia, unspecified organism: Secondary | ICD-10-CM | POA: Diagnosis not present

## 2021-11-23 DIAGNOSIS — R531 Weakness: Secondary | ICD-10-CM | POA: Diagnosis not present

## 2021-11-23 HISTORY — DX: Adult sexual abuse, confirmed, initial encounter: T74.21XA

## 2021-11-23 HISTORY — DX: Neoplasm of unspecified behavior of brain: D49.6

## 2021-11-23 LAB — COMPREHENSIVE METABOLIC PANEL
ALT: 18 U/L (ref 0–44)
AST: 31 U/L (ref 15–41)
Albumin: 3.7 g/dL (ref 3.5–5.0)
Alkaline Phosphatase: 64 U/L (ref 38–126)
Anion gap: 10 (ref 5–15)
BUN: 19 mg/dL (ref 6–20)
CO2: 24 mmol/L (ref 22–32)
Calcium: 8.5 mg/dL — ABNORMAL LOW (ref 8.9–10.3)
Chloride: 104 mmol/L (ref 98–111)
Creatinine, Ser: 0.78 mg/dL (ref 0.44–1.00)
GFR, Estimated: >60 mL/min (ref >60)
Glucose, Bld: 132 mg/dL — ABNORMAL HIGH (ref 70–99)
Potassium: 4.4 mmol/L (ref 3.5–5.1)
Sodium: 138 mmol/L (ref 135–145)
Total Bilirubin: 0.7 mg/dL (ref 0.3–1.2)
Total Protein: 6.2 g/dL — ABNORMAL LOW (ref 6.5–8.1)

## 2021-11-23 LAB — URINALYSIS, ROUTINE W REFLEX MICROSCOPIC
Bilirubin Urine: NEGATIVE
Glucose, UA: NEGATIVE mg/dL
Hgb urine dipstick: NEGATIVE
Ketones, ur: NEGATIVE mg/dL
Leukocytes,Ua: NEGATIVE
Nitrite: NEGATIVE
Protein, ur: 100 mg/dL — AB
Specific Gravity, Urine: 1.009 (ref 1.005–1.030)
pH: 7 (ref 5.0–8.0)

## 2021-11-23 LAB — ETHANOL: Alcohol, Ethyl (B): <10 mg/dL (ref <10)

## 2021-11-23 LAB — DIFFERENTIAL
Abs Immature Granulocytes: 0.05 10*3/uL (ref 0.00–0.07)
Basophils Absolute: 0 10*3/uL (ref 0.0–0.1)
Basophils Relative: 0 %
Eosinophils Absolute: 0 10*3/uL (ref 0.0–0.5)
Eosinophils Relative: 0 %
Immature Granulocytes: 1 %
Lymphocytes Relative: 2 %
Lymphs Abs: 0.1 10*3/uL — ABNORMAL LOW (ref 0.7–4.0)
Monocytes Absolute: 0.3 10*3/uL (ref 0.1–1.0)
Monocytes Relative: 5 %
Neutro Abs: 5.9 10*3/uL (ref 1.7–7.7)
Neutrophils Relative %: 92 %

## 2021-11-23 LAB — CBC
HCT: 36.7 % (ref 36.0–46.0)
Hemoglobin: 12.4 g/dL (ref 12.0–15.0)
MCH: 35.7 pg — ABNORMAL HIGH (ref 26.0–34.0)
MCHC: 33.8 g/dL (ref 30.0–36.0)
MCV: 105.8 fL — ABNORMAL HIGH (ref 80.0–100.0)
Platelets: 109 10*3/uL — ABNORMAL LOW (ref 150–400)
RBC: 3.47 MIL/uL — ABNORMAL LOW (ref 3.87–5.11)
RDW: 15.7 % — ABNORMAL HIGH (ref 11.5–15.5)
WBC: 6.4 10*3/uL (ref 4.0–10.5)
nRBC: 0 % (ref 0.0–0.2)

## 2021-11-23 LAB — APTT: aPTT: 22 seconds — ABNORMAL LOW (ref 24–36)

## 2021-11-23 LAB — PROTIME-INR
INR: 1 (ref 0.8–1.2)
Prothrombin Time: 13.1 seconds (ref 11.4–15.2)

## 2021-11-23 LAB — SARS CORONAVIRUS 2 BY RT PCR: SARS Coronavirus 2 by RT PCR: NEGATIVE

## 2021-11-23 LAB — CBG MONITORING, ED: Glucose-Capillary: 128 mg/dL — ABNORMAL HIGH (ref 70–99)

## 2021-11-23 MED ORDER — LACOSAMIDE 200 MG/20ML IV SOLN
200.0000 mg | Freq: Once | INTRAVENOUS | Status: AC
  Administered 2021-11-23: 200 mg via INTRAVENOUS
  Filled 2021-11-23: qty 20

## 2021-11-23 MED ORDER — LEVETIRACETAM IN NACL 1500 MG/100ML IV SOLN
1500.0000 mg | Freq: Two times a day (BID) | INTRAVENOUS | Status: DC
  Filled 2021-11-23: qty 100

## 2021-11-23 MED ORDER — LISINOPRIL 20 MG PO TABS
20.0000 mg | ORAL_TABLET | Freq: Every day | ORAL | Status: DC
  Administered 2021-11-24: 20 mg via ORAL
  Filled 2021-11-23: qty 1

## 2021-11-23 MED ORDER — ENOXAPARIN SODIUM 40 MG/0.4ML IJ SOSY
40.0000 mg | PREFILLED_SYRINGE | INTRAMUSCULAR | Status: DC
  Administered 2021-11-23: 40 mg via SUBCUTANEOUS
  Filled 2021-11-23: qty 0.4

## 2021-11-23 MED ORDER — PERAMPANEL 2 MG PO TABS: 4.0000 mg | ORAL_TABLET | Freq: Every day | ORAL | Status: DC

## 2021-11-23 MED ORDER — LACOSAMIDE 50 MG PO TABS
150.0000 mg | ORAL_TABLET | Freq: Two times a day (BID) | ORAL | Status: DC
  Administered 2021-11-23 – 2021-11-24 (×2): 150 mg via ORAL
  Filled 2021-11-23 (×2): qty 3

## 2021-11-23 MED ORDER — DEXAMETHASONE 4 MG PO TABS
2.0000 mg | ORAL_TABLET | Freq: Two times a day (BID) | ORAL | Status: DC
  Administered 2021-11-23 – 2021-11-24 (×2): 2 mg via ORAL
  Filled 2021-11-23 (×2): qty 1

## 2021-11-23 MED ORDER — GADOBUTROL 1 MMOL/ML IV SOLN
4.4000 mL | Freq: Once | INTRAVENOUS | Status: AC | PRN
  Administered 2021-11-23: 4.4 mL via INTRAVENOUS

## 2021-11-23 MED ORDER — LEVETIRACETAM 500 MG PO TABS
1500.0000 mg | ORAL_TABLET | Freq: Two times a day (BID) | ORAL | Status: DC
  Filled 2021-11-23: qty 3

## 2021-11-23 MED ORDER — SODIUM CHLORIDE 0.9 % IV SOLN
150.0000 mg | Freq: Two times a day (BID) | INTRAVENOUS | Status: DC
  Filled 2021-11-23: qty 15

## 2021-11-23 MED ORDER — LEVETIRACETAM IN NACL 1000 MG/100ML IV SOLN
1000.0000 mg | Freq: Once | INTRAVENOUS | Status: AC
  Administered 2021-11-23: 1000 mg via INTRAVENOUS
  Filled 2021-11-23: qty 100

## 2021-11-23 MED ORDER — LEVETIRACETAM 100 MG/ML PO SOLN
1500.0000 mg | Freq: Two times a day (BID) | ORAL | Status: DC
Start: 2021-11-23 — End: 2021-11-23

## 2021-11-23 MED ORDER — AMLODIPINE BESYLATE 5 MG PO TABS
10.0000 mg | ORAL_TABLET | Freq: Every day | ORAL | Status: DC
  Administered 2021-11-24: 10 mg via ORAL
  Filled 2021-11-23: qty 2

## 2021-11-23 MED ORDER — CLONAZEPAM 0.5 MG PO TABS: 0.5000 mg | ORAL_TABLET | ORAL | Status: DC | PRN

## 2021-11-23 MED ORDER — LEVETIRACETAM 500 MG PO TABS: 1500.0000 mg | ORAL_TABLET | Freq: Two times a day (BID) | ORAL | Status: DC

## 2021-11-23 MED ORDER — PERAMPANEL 4 MG PO TABS: 4.0000 mg | ORAL_TABLET | Freq: Every day | ORAL | Status: DC

## 2021-11-23 MED ORDER — PERAMPANEL 2 MG PO TABS
4.0000 mg | ORAL_TABLET | Freq: Every day | ORAL | Status: DC
  Administered 2021-11-23: 4 mg via ORAL
  Filled 2021-11-23: qty 2

## 2021-11-23 MED ORDER — DEXAMETHASONE SODIUM PHOSPHATE 10 MG/ML IJ SOLN
10.0000 mg | Freq: Once | INTRAMUSCULAR | Status: AC
  Administered 2021-11-23: 10 mg via INTRAVENOUS
  Filled 2021-11-23: qty 1

## 2021-11-23 MED ORDER — ACETAMINOPHEN 325 MG PO TABS: 650.0000 mg | ORAL_TABLET | ORAL | Status: DC | PRN

## 2021-11-23 MED ORDER — LEVETIRACETAM IN NACL 500 MG/100ML IV SOLN
500.0000 mg | Freq: Once | INTRAVENOUS | Status: AC
Start: 2021-11-23 — End: 2021-11-23
  Administered 2021-11-23: 500 mg via INTRAVENOUS
  Filled 2021-11-23: qty 100

## 2021-11-23 MED ORDER — LEVETIRACETAM IN NACL 1500 MG/100ML IV SOLN
1500.0000 mg | Freq: Two times a day (BID) | INTRAVENOUS | Status: DC
  Administered 2021-11-23 – 2021-11-24 (×2): 1500 mg via INTRAVENOUS
  Filled 2021-11-23 (×3): qty 100

## 2021-11-23 MED ORDER — LORAZEPAM 1 MG PO TABS
0.5000 mg | ORAL_TABLET | ORAL | Status: DC | PRN
Start: 2021-11-23 — End: 2021-11-24

## 2021-11-23 MED ORDER — VITAMIN D 25 MCG (1000 UNIT) PO TABS
10000.0000 [IU] | ORAL_TABLET | Freq: Every day | ORAL | Status: DC
  Filled 2021-11-23: qty 10

## 2021-11-23 MED ORDER — SODIUM CHLORIDE 0.9 % IV SOLN: 60.0000 mg/kg | Freq: Once | INTRAVENOUS | Status: DC

## 2021-11-23 MED ORDER — ACETAMINOPHEN 650 MG RE SUPP: 650.0000 mg | RECTAL | Status: DC | PRN

## 2021-11-23 MED ORDER — LACOSAMIDE 150 MG PO TABS: 150.0000 mg | ORAL_TABLET | Freq: Two times a day (BID) | ORAL | Status: DC

## 2021-11-23 MED ORDER — SODIUM CHLORIDE 0.9 % IV SOLN
75.0000 mL/h | INTRAVENOUS | Status: DC
  Administered 2021-11-23: 75 mL/h via INTRAVENOUS

## 2021-11-23 NOTE — ED Notes (Signed)
Called [facility] for consult to Neuro Oncology. Spoke with [employer].  Faxed Face[employer] and had CT Scan shared.

## 2021-11-23 NOTE — Assessment & Plan Note (Signed)
Patient with h/o HLD  Plan Continue home meds

## 2021-11-23 NOTE — Assessment & Plan Note (Addendum)
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 See care everywhere for details of treatment at [facility].  Plan  To continue with treatment at [facility] as scheduled- Tuesday 11/27/21 for avastin infusion
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
## 2021-11-23 NOTE — Assessment & Plan Note (Signed)
Patient with seizures associated with GBM. She is on extensive AEDs including Keppra, Fucompa and Vimpat. Now with possible aphasic continuous seizures with increased symptoms: aphasia, movement disorder. With change in symptoms [facility] oncology nurse recommended nearest ED which was [facility]. Neurology consulted at [facility] and recommended she be transferred to [facility] for continuous EEG to assess need for changing or adding to AED regimen.  Plan  Med-surg obs  24 hr EEG  Continue home AED regimen  Sz precautions  Neurology to follow.
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
## 2021-11-23 NOTE — ED Triage Notes (Addendum)
Pt here with R sided weakness from brain tumor. This morning around 0730, pt with worsening R sided weakness with R arm being pulled up for approx 10 minutes, worsening speech and off balance. Family states yesterday pt started with the worsening speech.
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
## 2021-11-23 NOTE — Subjective & Objective (Signed)
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  She has been on AEDs since 2021 and is currently taking Keppra 1500 mg BID, Fycompa 4 mg qHS and Vimpat 150 mg BID.  These medications have reportedly provided her with good control of her seizures, as according to her husband, she has a focal seizure once every few months.  Patient has had one GTC in early July which was stopped with intranasal midazolam.  Patient has had increased symptoms with increased aphasia for the past two days, increased right sided weakness, rightward leaning when walking and abnormal movement right arm. No evidence of infection in [facility]-ED workup. MRI reveals known changes. Neuro evaluated patient and recommended transfer to [facility] for 24 hr continuous EEG.

## 2021-11-23 NOTE — ED Notes (Signed)
[facility] called for transport. 

## 2021-11-23 NOTE — ED Notes (Addendum)
Pt BIB [facility] for neuro consult/EEG. Pt stable w/ [facility] and stable at this time. VSS w/ [facility]

## 2021-11-23 NOTE — ED Provider Notes (Signed)
  Physical Exam  BP 126/70   Pulse 85   Temp 98.7 F (37.1 C) (Oral)   Resp 17   SpO2 100%   Physical Exam Vitals and nursing note reviewed.  Constitutional:      General: She is not in acute distress.    Appearance: She is well-developed.  HENT:     Head: Normocephalic and atraumatic.  Eyes:     Conjunctiva/sclera: Conjunctivae normal.  Cardiovascular:     Rate and Rhythm: Normal rate and regular rhythm.     Heart sounds: No murmur heard. Pulmonary:     Effort: Pulmonary effort is normal. No respiratory distress.  Musculoskeletal:        General: No swelling.     Cervical back: Neck supple.  Skin:    General: Skin is warm and dry.     Capillary Refill: Capillary refill takes less than 2 seconds.  Neurological:     Mental Status: She is alert.     Motor: Weakness present.  Psychiatric:        Mood and Affect: Mood normal.     Procedures  Procedures  ED Course / MDM    Medical Decision Making Amount and/or Complexity of Data Reviewed Labs: ordered. Radiology: ordered.  Risk Prescription drug management.   Patient received in transfer for known GBM with concern for underlying subclinical status.  Neurology consulted who came and evaluated the patient at bedside and is recommending hospital admission for 24-hour EEG.  Patient then admitted.       Teressa Lower, MD 11/23/21 [phone]

## 2021-11-23 NOTE — Assessment & Plan Note (Signed)
Patient with h/o elevated BP currently controlled. She is on multiple antihypertensive meds  Plan Continue home regimen.

## 2021-11-23 NOTE — Progress Notes (Signed)
LTM EEG hooked up and running - no initial skin breakdown - push button tested - neuro notified. No [facility] monitoring. Pt currently in ED.
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
## 2021-11-24 ENCOUNTER — Observation Stay (HOSPITAL_COMMUNITY): Payer: 59

## 2021-11-24 DIAGNOSIS — R911 Solitary pulmonary nodule: Secondary | ICD-10-CM | POA: Diagnosis not present

## 2021-11-24 DIAGNOSIS — R569 Unspecified convulsions: Secondary | ICD-10-CM | POA: Diagnosis not present

## 2021-11-24 LAB — URINE CULTURE: Culture: <10000 — AB

## 2021-11-24 MED ORDER — LACOSAMIDE 200 MG PO TABS: 200.0000 mg | ORAL_TABLET | Freq: Two times a day (BID) | ORAL | 0 refills | Status: DC

## 2021-11-24 MED ORDER — LACOSAMIDE 50 MG PO TABS
50.0000 mg | ORAL_TABLET | Freq: Once | ORAL | Status: AC
Start: 2021-11-24 — End: 2021-11-24
  Administered 2021-11-24: 50 mg via ORAL
  Filled 2021-11-24: qty 1

## 2021-11-24 MED ORDER — LACOSAMIDE 50 MG PO TABS
200.0000 mg | ORAL_TABLET | Freq: Two times a day (BID) | ORAL | Status: DC
Start: 2021-11-24 — End: 2021-11-24

## 2021-11-24 NOTE — Hospital Course (Addendum)
56 y/o with GBM anaplastic astrocytoma, glioblastoma and history of focal seizures followed at [facility]  presents with two day history of worsening aphasia, right sided weakness as well as prolonged flexion of the right arm 8/18 am. Seen in ED  [facility]-EDP exam notable for tenderness right calf, aphasia, unable to follow commands, Right sided weakness UE. Lab: Cmet nl except Ca 8.5, glucose 132. U/A negative, CBC nl except plts 109, Diff 92/2/5. CXR irregular nodular density Left mid-lung. MRI left frontal obular mass w/ edema/infiltrating tumor with edema, no mass effect  Evaluated by Neuro and recommended transfer to [facility] for 24 hr EEG> patient clinically condition had improved LTM completed 8/19 AM at this time okay for discharge neurology increased Vimpat to 200 twice daily rest of the medical history of the same.

## 2021-11-24 NOTE — Discharge Summary (Signed)
Physician Discharge Summary  Kristen Foley ZOX:[id number] DOB: 1965/05/07 DOA: 11/23/2021  PCP: Unk Pinto, MD  Admit date: 11/23/2021 Discharge date: 11/24/2021 Recommendations for Outpatient Follow-up:  Follow up with PCP in 1 weeks-call for appointment Follow-up with her primary oncology/PCP with repeat CT chest as instructed  Discharge Dispo: home Discharge Condition: Stable Code Status:   Code Status: Full Code Diet recommendation:  Diet Order             Diet regular Room service appropriate? 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 Seen in ED  DWB-EDP exam notable for tenderness right calf, aphasia, unable to follow commands, Right sided weakness UE. Lab: Cmet nl except Ca 8.5, glucose 132. U/A negative, CBC nl except plts 109, Diff 92/2/5. CXR irregular nodular density Left mid-lung. MRI left frontal obular mass w/ edema/infiltrating tumor with edema, no mass effect  Evaluated by Neuro and recommended transfer to [facility] for 24 hr EEG> patient clinically condition had improved LTM completed 8/19 AM at this time okay for discharge neurology increased Vimpat to 200 twice daily rest of the medical history of the same.  Discussed medication with family and okay for dc home  Discharge Diagnoses:  Principal Problem:   Seizure ([place]) Active Problems:   GBM (glioblastoma multiforme) ([place])   Elevated BP without diagnosis of hypertension   Hyperlipidemia, mixed   Seizures (H[facility])  Seizure disorder Known glioblastoma multiforme/astrocytoma followed at [facility]: Neurology following for continuous EEG x 12 This study is suggestive of cortical dysfunction arising from left hemisphere,maximal left temporal region  likely secondary to underlying structural abnormality. No seizures or epileptiform discharges were seen throughout the recording. Per neuro to continue with with Keppra 1500 twice daily, Vimpat 150 twice daily> increased to 200 twice daily and prescription sent to [facility] ( per neuro recs) cont Fycompa 4 mg bedtime.  Patient also on home meds with prn clonazepam, Ativan, midazolam for seizure. Continue seizure precaution fall precaution, supportive care.  Pending further neuro inputs. He gets treatment at [facility] next one Tuesday 11/27/21 for avastin infusion.Continue patient's Decadron.   Hypertension continue amlodipine and lisinopril Hyperlipidemia, mixed-not on meds. Low-grade temp 100.8 on admission-wbc stable,cxr irregular nodular density laterally in the left midlung concerning for metastatic disease. UA on admission WBC 0-5 unremarkable.  Encourage I-S, monitor.  Abnormal CXR irregular nodular density laterally in the left midlung concerning for metastatic disease.  Obtain CT chest that showed:12 mm nodule in the left upper lobe corresponding to the chest radiographic abnormality. This has central cavitation. Prior chest radiographic history reports fever and history of glioblastoma. Since this nodule may be inflammatory, recommend short-term follow-up unenhanced chest CT in 2-3 months to reassess -Instruction provided in patient's discharge summary.  He is going to discuss this with patient's oncologist on Monday.  Consults: Neurology Subjective: Alert awake oriented resting comfortably, no new complaints. Discharge Exam: Vitals:   11/24/21 0932 11/24/21 0936  BP:    Pulse:  64  Resp:    Temp: 98.2 F (36.8 C)   SpO2:  100%   General: Pt is alert, awake, not in acute distress Cardiovascular: RRR, S1/S2 +, no rubs, no gallops  Respiratory: CTA bilaterally, no wheezing, no rhonchi Abdominal: Soft, NT, ND, bowel sounds + Extremities: no edema, no cyanosis  Discharge  Instructions  Discharge Instructions     Discharge instructions   Complete by: As directed    Ct chest showed :12 mm nodule in the left upper lobe corresponding to the chest radiographic abnormality. This has central cavitation. Prior chest radiographic history reports fever and history of glioblastoma. Since this nodule may be inflammatory, recommend short-term follow-up unenhanced chest CT in 2-3 months to reassess. Small stable nodules in the right lung, benign with no follow-up recommended   Please call call MD or return to ER for similar or worsening recurring problem that brought you to hospital or if any fever,nausea/vomiting,abdominal pain, uncontrolled pain, chest pain,  shortness of breath or any other alarming symptoms.  Please follow-up your doctor as instructed in a week time and call the office for appointment.  Please avoid alcohol, smoking, or any other illicit substance and maintain healthy habits including taking your regular medications as prescribed.  You were cared for by a hospitalist during your hospital stay. If you have any questions about your discharge medications or the care you received while you were in the hospital after you are discharged, you can call the unit and ask to speak with the hospitalist on call if the hospitalist that took care of you is not available.  Once you are discharged, your primary care physician will handle any further medical issues. Please note that NO REFILLS for any discharge medications will be authorized once you are discharged, as it is imperative that you return to your primary care physician (or establish a relationship with a primary care physician if you do not have one) for your aftercare needs so that they can reassess your need for medications and monitor your lab values   Increase activity slowly   Complete by: As directed       Allergies as of 11/24/2021       Reactions   Lexapro [escitalopram Oxalate] Other (See  Comments)   "it was too strong for me"   Wellbutrin [bupropion] Other (See Comments)   "it was too strong for me"   Zoloft [sertraline Hcl] Other (See Comments)   "it was too strong for me"        Medication List     TAKE these medications    amLODipine 10 MG tablet Commonly known as: NORVASC Take 10 mg by mouth daily.   clonazePAM 0.5 MG tablet Commonly known as: KLONOPIN Take 0.5 mg by mouth as needed (seizure). >3 small seizures in a day   dexamethasone 2 MG tablet Commonly known as: DECADRON Take 2 mg by mouth 2 (two) times daily.   Fycompa 4 MG Tabs Generic drug: Perampanel Take 4 mg by mouth at bedtime.   Gleostine 40 MG capsule Generic drug: lomustine Take 120 mg by mouth See admin instructions. Every six weeks   lacosamide 200 MG Tabs tablet Commonly known as: Vimpat Take 1 tablet (200 mg total) by mouth 2 (two) times daily. What changed:  medication strength how much to take   levETIRAcetam 1000 MG tablet Commonly known as: KEPPRA Take 2 tablets (2,000 mg total) by mouth 2 (two) times daily. What changed: additional instructions   levETIRAcetam 100 MG/ML solution Commonly known as: KEPPRA Take 1,500 mg by mouth 2 (two) times daily. What changed: Another medication with the same name was changed. Make sure you understand how and when to take each.  lisinopril 20 MG tablet Commonly known as: ZESTRIL Take 20 mg by mouth daily.   LORazepam 0.5 MG tablet Commonly known as: ATIVAN Take 0.5 mg by mouth every 4 (four) hours as needed for seizure.   Nayzilam 5 MG/0.1ML Soln Generic drug: Midazolam Place 5 mg into the nose once.   ondansetron 8 MG tablet Commonly known as: ZOFRAN Take by mouth.   Vitamin D3 125 MCG (5000 UT) Caps Take 10,000 Units by mouth daily.        Follow-up Information     Unk Pinto, MD Follow up in 1 week(s).   Specialty: Internal Medicine Contact information: [address] [place]  [address] [phone]                Allergies  Allergen Reactions   Lexapro [Escitalopram Oxalate] Other (See Comments)    "it was too strong for me"   Wellbutrin [Bupropion] Other (See Comments)    "it was too strong for me"   Zoloft [Sertraline Hcl] Other (See Comments)    "it was too strong for me"    The results of significant diagnostics from this hospitalization (including imaging, microbiology, ancillary and laboratory) are listed below for reference.    Microbiology: Recent Results (from the past 240 hour(s))  SARS Coronavirus 2 by RT PCR (hospital order, performed in [facility] hospital lab) *cepheid single result test* Anterior Nasal Swab     Status: None   Collection Time: 11/23/21 11:05 AM   Specimen: Anterior Nasal Swab  Result Value Ref Range Status   SARS Coronavirus 2 by RT PCR NEGATIVE NEGATIVE Final    Comment: (NOTE) SARS-CoV-2 target nucleic acids are NOT DETECTED.  The SARS-CoV-2 RNA is generally detectable in upper and lower respiratory specimens during the acute phase of infection. The lowest concentration of SARS-CoV-2 viral copies this assay can detect is 250 copies / mL. A negative result does not preclude SARS-CoV-2 infection and should not be used as the sole basis for treatment or other patient management decisions.  A negative result may occur with improper specimen collection / handling, submission of specimen other than nasopharyngeal swab, presence of viral mutation(s) within the areas targeted by this assay, and inadequate number of viral copies (<250 copies / mL). A negative result must be combined with clinical observations, patient history, and epidemiological information.  Fact Sheet for Patients:   https://www.patel.info/  Fact Sheet for Healthcare Providers: https://hall.com/  This test is not yet approved or  cleared by the [place] FDA and has been authorized for detection  and/or diagnosis of SARS-CoV-2 by FDA under an Emergency Use Authorization (EUA).  This EUA will remain in effect (meaning this test can be used) for the duration of the COVID-19 declaration under Section 564(b)(1) of the Act, 21 U.S.C. section 360bbb-3(b)(1), unless the authorization is terminated or revoked sooner.  Performed at [facility], [address] [place], [place] [address]     Procedures/Studies: CT CHEST WO CONTRAST  Result Date: 11/24/2021 CLINICAL DATA:  Follow-up for abnormal chest radiograph. Possible left mid lung nodule. EXAM: CT CHEST WITHOUT CONTRAST TECHNIQUE: Multidetector CT imaging of the chest was performed following the standard protocol without IV contrast. RADIATION DOSE REDUCTION: This exam was performed according to the departmental dose-optimization program which includes automated exposure control, adjustment of the mA and/or kV according to patient size and/or use of iterative reconstruction technique. COMPARISON:  Chest radiograph, 11/23/2021.  Chest CT, 06/02/2019. FINDINGS: Cardiovascular: Heart is normal in  size and configuration. No pericardial effusion. No coronary artery calcifications. Great vessels are normal in caliber. Mediastinum/Nodes: No enlarged mediastinal or axillary lymph nodes. Thyroid gland, trachea, and esophagus demonstrate no significant findings. Lungs/Pleura: Central cavitary nodule, left upper lobe posteriorly and laterally, centered on image 63, series 5, with smaller contiguous nodules extending from its posterior margin toward the lateral aspect of the oblique fissure. Nodule measures 12 mm. Nodule and the adjacent posterior nodularity spans a total of 2.2 cm. This corresponds to the opacity noted on the current chest radiograph and is new from the prior chest CT. Small stable nodules, 3 mm nodule, right upper lobe, image 40, series 5, Peri fissural nodule, 4 mm, image 82, series 5 and right lower lobe nodule, 3-4 mm,  image 84, series 5. Given the stability for over 2 years, these are benign. No other pulmonary nodules. Lungs demonstrate interstitial thickening, lower lung predominance, and basilar opacities in the lower lobes consistent with atelectasis. No pleural effusion.  No pneumothorax. Upper Abdomen: Unremarkable. Musculoskeletal: No fracture or acute finding.  No bone lesion. Small rounded mass in the right breast, 5-6 mm, benign and stable from mammograms dated 2019 in 2017. IMPRESSION: 1. 12 mm nodule in the left upper lobe corresponding to the chest radiographic abnormality. This has central cavitation. Prior chest radiographic history reports fever and history of glioblastoma. Since this nodule may be inflammatory, recommend short-term follow-up unenhanced chest CT in 2-3 months to reassess. 2. Bilateral interstitial thickening, mostly in the lower lungs, which is new from the prior CT. Consider interstitial edema if the patient has consistent clinical symptoms. 3. Small stable nodules in the right lung, benign with no follow-up recommended. Electronically Signed   By: Lajean Manes M.D.   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  EEG to evaluate for seizure Level of alertness: Awake, asleep AEDs during EEG study: LEV, LCM, Fycompa Technical aspects: This EEG study was done with scalp electrodes positioned according to the 10-20 International system of electrode placement. Electrical activity was reviewed with band pass filter of 1-'70Hz'$ , sensitivity of  7 uV/mm, display speed of 76m/sec with a '60Hz'$  notched filter applied as appropriate. EEG data were recorded continuously and digitally stored.  Video monitoring was available and reviewed as appropriate. Description: The posterior dominant rhythm consists of 8 Hz activity of moderate voltage (25-35 uV) seen predominantly in posterior head regions, asymmetric( left<right) and reactive to eye opening and eye closing. Sleep was characterized by vertex waves, sleep spindles (12 to 14 Hz), maximal frontocentral region. EEG showed continuous 3 to 6 Hz theta-delta slowing in left hemisphere, maximal left temporal region. Hyperventilation and photic stimulation were not performed.   ABNORMALITY - Continuous slow, left hemisphere, maximal left temporal region - Background asymmetry, left<right IMPRESSION: This study is suggestive of cortical dysfunction arising from left hemisphere, maximal left temporal region likely secondary to underlying structural abnormality. No seizures or epileptiform discharges were seen throughout the recording. Kristen Foley  [place]Venous Img Lower Unilateral Right  Result Date: 11/23/2021 CLINICAL DATA:  Pain right calf EXAM: Right LOWER EXTREMITY VENOUS DOPPLER ULTRASOUND  TECHNIQUE: Gray-scale sonography with compression, as well as color and duplex ultrasound, were performed to evaluate the deep venous system(s) from the level of the common femoral vein through the popliteal and proximal calf veins. COMPARISON:  None Available. FINDINGS: VENOUS Normal compressibility of the common femoral, superficial femoral, and popliteal veins, as well as the visualized calf veins. Visualized portions of profunda femoral vein and great saphenous vein unremarkable. No filling defects to suggest DVT on grayscale or color Doppler imaging. Doppler waveforms show normal direction of venous flow, normal respiratory plasticity and response to augmentation. Limited views of the contralateral common femoral  vein are unremarkable. OTHER None. Limitations: none IMPRESSION: There is no evidence of deep venous thrombosis in right lower extremity. Electronically Signed   By: Elmer Picker M.D.   On: 11/23/2021 12:51   DG Chest Portable 1 View  Result Date: 11/23/2021 CLINICAL DATA:  Fever.  History of glioblastoma. EXAM: PORTABLE CHEST 1 VIEW COMPARISON:  None Available. FINDINGS: The heart size and mediastinal contours are within normal limits. Mild bibasilar subsegmental atelectasis or scarring is noted. Irregular nodular density is noted laterally in left midlung concerning for pulmonary nodule or metastatic disease. The visualized skeletal structures are unremarkable. IMPRESSION: Irregular nodular density seen laterally in the left midlung concerning for metastatic disease. CT scan of the chest is recommended for further evaluation. Electronically Signed   By: Marijo Conception M.D.   On: 11/23/2021 11:49   MR Brain W and Wo Contrast  Result Date: 11/23/2021 CLINICAL DATA:  Neuro deficit, acute, stroke suspected glioblastoma, focal seizures, worsening aphasia EXAM: MRI HEAD WITHOUT AND WITH CONTRAST TECHNIQUE: Multiplanar, multiecho pulse sequences of the brain and surrounding structures were obtained without and with intravenous contrast. CONTRAST:  4.4m GADAVIST GADOBUTROL 1 MMOL/ML IV SOLN COMPARISON:  March 2021 FINDINGS: Brain: There is a heterogeneous T2 hyperintense mass of the high left frontal lobe measuring about 4.5 x 3.1 x 5 cm. There is enhancement at the periphery. Surrounding T2 FLAIR hyperintensity with involvement of the body of the corpus callosum. There is also some T2 FLAIR hyperintensity along the more posterior corpus callosum and splenium. Decreased T2 hyperintensity in the insula and temporal lobe compared to the remote available study. Mass effect is minor. No acute infarction or hemorrhage. No hydrocephalus or extra-axial collection. Vascular: Major vessel flow voids at the skull  base are preserved. Skull and upper cervical spine: Normal marrow signal is preserved. Sinuses/Orbits: Paranasal sinuses are aerated. Orbits are unremarkable. Other: Sella is unremarkable.  Mastoid air cells are clear. IMPRESSION: No recent prior studies are available for comparison. Left frontal lobe mass and surrounding edema/infiltrating tumor. Paucity of enhancement may reflect bevacizumab treatment. There is no hemorrhage or significant mass effect. Electronically Signed   By: PMacy MisM.D.   On: 11/23/2021 11:13   CT HEAD WO CONTRAST  Result Date: 11/23/2021 CLINICAL DATA:  History of glioblastoma with speech issues and right-sided weakness. EXAM: CT HEAD WITHOUT CONTRAST TECHNIQUE: Contiguous axial images were obtained from the base of the skull through the vertex without intravenous contrast. RADIATION DOSE REDUCTION: This exam was performed according to the departmental dose-optimization program which includes automated exposure control, adjustment of the mA and/or kV according to patient size and/or use of iterative reconstruction technique. COMPARISON:  Brain MRI 07/05/2019 FINDINGS: Brain: There is edema in the left frontal lobe predominantly in the superior and middle frontal gyri. Some of the overlying cortex appears involved while other areas appears spared (for example 4-39, 4-36).  There is no associated hemorrhage. This is distinct from the region of signal abnormality in the brain MRI from 2021. There is regional sulcal effacement with partial effacement of the left lateral ventricle but no midline shift. There is no acute intracranial hemorrhage or extra-axial fluid collection. Background parenchymal volume is normal. The ventricles are otherwise normal in size. Vascular: No hyperdense vessel or unexpected calcification. Skull: Normal. Negative for fracture or focal lesion. Sinuses/Orbits: Imaged paranasal sinuses are clear. The globes and orbits are unremarkable. Other: None.  IMPRESSION: 1. Predominantly vasogenic appearing edema in the left frontal lobe raise concern for underlying mass lesion especially given the history of glioma, though ischemia remains a possibility. This area is separate from the region of signal abnormality seen on the brain MRI from 2021. Recommend brain MRI with and without contrast for further evaluation. 2. No acute intracranial hemorrhage. Electronically Signed   By: Valetta Mole M.D.   On: 11/23/2021 09:54    Labs: BNP (last 3 results) No results for input(s): "BNP" in the last 8760 hours. Basic Metabolic Panel: Recent Labs  Lab 11/20/21 1035 11/23/21 0924  NA 141 138  K 4.7 4.4  CL 105 104  CO2 26 24  GLUCOSE 94 132*  BUN 21 19  CREATININE 0.83 0.78  CALCIUM 8.9 8.5*   Liver Function Tests: Recent Labs  Lab 11/20/21 1035 11/23/21 0924  AST 20 31  ALT 16 18  ALKPHOS  --  64  BILITOT 0.6 0.7  PROT 5.6* 6.2*  ALBUMIN  --  3.7   No results for input(s): "LIPASE", "AMYLASE" in the last 168 hours. No results for input(s): "AMMONIA" in the last 168 hours. CBC: Recent Labs  Lab 11/20/21 1035 11/23/21 0924  WBC 7.2 6.4  NEUTROABS 6,653 5.9  HGB 12.3 12.4  HCT 35.1 36.7  MCV 104.5* 105.8*  PLT 119* 109*   Cardiac Enzymes: No results for input(s): "CKTOTAL", "CKMB", "CKMBINDEX", "TROPONINI" in the last 168 hours. BNP: Invalid input(s): "POCBNP" CBG: Recent Labs  Lab 11/23/21 0921  GLUCAP 128*   D-Dimer No results for input(s): "DDIMER" in the last 72 hours. Hgb A1c No results for input(s): "HGBA1C" in the last 72 hours. Lipid Profile No results for input(s): "CHOL", "HDL", "LDLCALC", "TRIG", "CHOLHDL", "LDLDIRECT" in the last 72 hours. Thyroid function studies No results for input(s): "TSH", "T4TOTAL", "T3FREE", "THYROIDAB" in the last 72 hours.  Invalid input(s): "FREET3" Anemia work up No results for input(s): "VITAMINB12", "FOLATE", "FERRITIN", "TIBC", "IRON", "RETICCTPCT" in the last 72  hours. Urinalysis    Component Value Date/Time   COLORURINE YELLOW 11/23/2021 1400   APPEARANCEUR CLEAR 11/23/2021 1400   LABSPEC 1.009 11/23/2021 1400   PHURINE 7.0 11/23/2021 1400   GLUCOSEU NEGATIVE 11/23/2021 1400   HGBUR NEGATIVE 11/23/2021 1400   BILIRUBINUR NEGATIVE 11/23/2021 1400   KETONESUR NEGATIVE 11/23/2021 1400   PROTEINUR 100 (A) 11/23/2021 1400   NITRITE NEGATIVE 11/23/2021 1400   LEUKOCYTESUR NEGATIVE 11/23/2021 1400   Sepsis Labs Recent Labs  Lab 11/20/21 1035 11/23/21 0924  WBC 7.2 6.4   Microbiology Recent Results (from the past 240 hour(s))  SARS Coronavirus 2 by RT PCR (hospital order, performed in [place] hospital lab) *cepheid single result test* Anterior Nasal Swab     Status: None   Collection Time: 11/23/21 11:05 AM   Specimen: Anterior Nasal Swab  Result Value Ref Range Status   SARS Coronavirus 2 by RT PCR NEGATIVE NEGATIVE Final    Comment: (NOTE) SARS-CoV-2 target nucleic acids  are NOT DETECTED.  The SARS-CoV-2 RNA is generally detectable in upper and lower respiratory specimens during the acute phase of infection. The lowest concentration of SARS-CoV-2 viral copies this assay can detect is 250 copies / mL. A negative result does not preclude SARS-CoV-2 infection and should not be used as the sole basis for treatment or other patient management decisions.  A negative result may occur with improper specimen collection / handling, submission of specimen other than nasopharyngeal swab, presence of viral mutation(s) within the areas targeted by this assay, and inadequate number of viral copies (<250 copies / mL). A negative result must be combined with clinical observations, patient history, and epidemiological information.  Fact Sheet for Patients:   https://www.patel.info/  Fact Sheet for Healthcare Providers: https://hall.com/  This test is not yet approved or  cleared by the [place]  FDA and has been authorized for detection and/or diagnosis of SARS-CoV-2 by FDA under an Emergency Use Authorization (EUA).  This EUA will remain in effect (meaning this test can be used) for the duration of the COVID-19 declaration under Section 564(b)(1) of the Act, 21 U.S.C. section 360bbb-3(b)(1), unless the authorization is terminated or revoked sooner.  Performed at [facility], [address] [place], [place] [address]   Time coordinating discharge: 25 minutes  SIGNED: Antonieta Pert, MD  [facility] 11/24/2021, 11:08 AM  If 7PM-7AM, please contact night-coverage [email or web address]

## 2021-11-24 NOTE — Progress Notes (Signed)
LTM EEG discontinued - no skin breakdown at unhook.   

## 2021-11-24 NOTE — Procedures (Signed)
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  EEG to evaluate for seizure  Level of alertness: Awake, asleep  AEDs during EEG study: LEV, LCM, Fycompa  Technical aspects: This EEG study was done with scalp electrodes positioned according to the 10-20 International system of electrode placement. Electrical activity was reviewed with band pass filter of 1-'70Hz'$ , sensitivity of 7 uV/mm, display speed of 79m/sec with a '60Hz'$  notched filter applied as appropriate. EEG data were recorded continuously and digitally stored.  Video monitoring was available and reviewed as appropriate.  Description: The posterior dominant rhythm consists of 8 Hz activity of moderate voltage (25-35 uV) seen predominantly in posterior head regions, asymmetric( left<right) and reactive to eye opening and eye closing. Sleep was characterized by vertex waves, sleep spindles (12 to 14 Hz), maximal frontocentral region. EEG showed continuous 3 to 6 Hz theta-delta slowing in left hemisphere, maximal left temporal region. Hyperventilation and photic stimulation were not performed.     ABNORMALITY - Continuous slow, left hemisphere, maximal left temporal region - Background asymmetry, left<right  IMPRESSION: This study is suggestive of cortical dysfunction arising from left hemisphere, maximal left temporal region likely secondary to underlying structural abnormality.  No seizures or epileptiform discharges were seen throughout the recording.  Kristen Foley [name]

## 2021-11-25 ENCOUNTER — Emergency Department (HOSPITAL_BASED_OUTPATIENT_CLINIC_OR_DEPARTMENT_OTHER): Payer: 59

## 2021-11-25 ENCOUNTER — Encounter (HOSPITAL_BASED_OUTPATIENT_CLINIC_OR_DEPARTMENT_OTHER): Payer: Self-pay | Admitting: Emergency Medicine

## 2021-11-25 ENCOUNTER — Inpatient Hospital Stay (HOSPITAL_BASED_OUTPATIENT_CLINIC_OR_DEPARTMENT_OTHER)
Admission: EM | Admit: 2021-11-25 | Discharge: 2021-12-03 | DRG: 871 | Disposition: A | Discharge status: Home Health | Payer: 59 | Attending: Internal Medicine | Admitting: Internal Medicine

## 2021-11-25 ENCOUNTER — Encounter (HOSPITAL_COMMUNITY): Payer: Self-pay

## 2021-11-25 ENCOUNTER — Other Ambulatory Visit: Payer: Self-pay

## 2021-11-25 DIAGNOSIS — Z79899 Other long term (current) drug therapy: Secondary | ICD-10-CM | POA: Diagnosis not present

## 2021-11-25 DIAGNOSIS — R7989 Other specified abnormal findings of blood chemistry: Secondary | ICD-10-CM | POA: Diagnosis present

## 2021-11-25 DIAGNOSIS — R569 Unspecified convulsions: Secondary | ICD-10-CM

## 2021-11-25 DIAGNOSIS — C711 Malignant neoplasm of frontal lobe: Secondary | ICD-10-CM | POA: Diagnosis present

## 2021-11-25 DIAGNOSIS — I10 Essential (primary) hypertension: Secondary | ICD-10-CM | POA: Diagnosis present

## 2021-11-25 DIAGNOSIS — G9341 Metabolic encephalopathy: Secondary | ICD-10-CM | POA: Diagnosis not present

## 2021-11-25 DIAGNOSIS — Z20822 Contact with and (suspected) exposure to covid-19: Secondary | ICD-10-CM | POA: Diagnosis not present

## 2021-11-25 DIAGNOSIS — G40909 Epilepsy, unspecified, not intractable, without status epilepticus: Secondary | ICD-10-CM | POA: Diagnosis not present

## 2021-11-25 DIAGNOSIS — C719 Malignant neoplasm of brain, unspecified: Secondary | ICD-10-CM | POA: Diagnosis not present

## 2021-11-25 DIAGNOSIS — E8809 Other disorders of plasma-protein metabolism, not elsewhere classified: Secondary | ICD-10-CM | POA: Diagnosis not present

## 2021-11-25 DIAGNOSIS — R944 Abnormal results of kidney function studies: Secondary | ICD-10-CM | POA: Diagnosis present

## 2021-11-25 DIAGNOSIS — D61810 Antineoplastic chemotherapy induced pancytopenia: Secondary | ICD-10-CM | POA: Diagnosis not present

## 2021-11-25 DIAGNOSIS — E782 Mixed hyperlipidemia: Secondary | ICD-10-CM | POA: Diagnosis present

## 2021-11-25 DIAGNOSIS — R6520 Severe sepsis without septic shock: Secondary | ICD-10-CM | POA: Diagnosis present

## 2021-11-25 DIAGNOSIS — R4701 Aphasia: Secondary | ICD-10-CM | POA: Diagnosis not present

## 2021-11-25 DIAGNOSIS — R911 Solitary pulmonary nodule: Secondary | ICD-10-CM | POA: Diagnosis not present

## 2021-11-25 DIAGNOSIS — R5383 Other fatigue: Secondary | ICD-10-CM

## 2021-11-25 DIAGNOSIS — D6869 Other thrombophilia: Secondary | ICD-10-CM | POA: Diagnosis not present

## 2021-11-25 DIAGNOSIS — J90 Pleural effusion, not elsewhere classified: Secondary | ICD-10-CM | POA: Diagnosis present

## 2021-11-25 DIAGNOSIS — T451X5A Adverse effect of antineoplastic and immunosuppressive drugs, initial encounter: Secondary | ICD-10-CM | POA: Diagnosis present

## 2021-11-25 DIAGNOSIS — A419 Sepsis, unspecified organism: Principal | ICD-10-CM | POA: Diagnosis present

## 2021-11-25 DIAGNOSIS — J9601 Acute respiratory failure with hypoxia: Secondary | ICD-10-CM | POA: Diagnosis not present

## 2021-11-25 DIAGNOSIS — D696 Thrombocytopenia, unspecified: Secondary | ICD-10-CM | POA: Diagnosis present

## 2021-11-25 DIAGNOSIS — J189 Pneumonia, unspecified organism: Secondary | ICD-10-CM | POA: Diagnosis not present

## 2021-11-25 DIAGNOSIS — R531 Weakness: Secondary | ICD-10-CM | POA: Diagnosis not present

## 2021-11-25 DIAGNOSIS — Z888 Allergy status to other drugs, medicaments and biological substances status: Secondary | ICD-10-CM | POA: Diagnosis not present

## 2021-11-25 DIAGNOSIS — R9431 Abnormal electrocardiogram [ECG] [EKG]: Secondary | ICD-10-CM | POA: Diagnosis not present

## 2021-11-25 DIAGNOSIS — J811 Chronic pulmonary edema: Secondary | ICD-10-CM | POA: Diagnosis present

## 2021-11-25 DIAGNOSIS — R0602 Shortness of breath: Secondary | ICD-10-CM | POA: Diagnosis not present

## 2021-11-25 DIAGNOSIS — J9811 Atelectasis: Secondary | ICD-10-CM | POA: Diagnosis not present

## 2021-11-25 DIAGNOSIS — A689 Relapsing fever, unspecified: Secondary | ICD-10-CM | POA: Diagnosis not present

## 2021-11-25 LAB — CBC
HCT: 34.5 % — ABNORMAL LOW (ref 36.0–46.0)
Hemoglobin: 11.3 g/dL — ABNORMAL LOW (ref 12.0–15.0)
MCH: 35.9 pg — ABNORMAL HIGH (ref 26.0–34.0)
MCHC: 32.8 g/dL (ref 30.0–36.0)
MCV: 109.5 fL — ABNORMAL HIGH (ref 80.0–100.0)
Platelets: 103 10*3/uL — ABNORMAL LOW (ref 150–400)
RBC: 3.15 MIL/uL — ABNORMAL LOW (ref 3.87–5.11)
RDW: 15.6 % — ABNORMAL HIGH (ref 11.5–15.5)
WBC: 3.8 10*3/uL — ABNORMAL LOW (ref 4.0–10.5)
nRBC: 0 % (ref 0.0–0.2)

## 2021-11-25 LAB — CBC WITH DIFFERENTIAL/PLATELET
Abs Immature Granulocytes: 0.04 10*3/uL (ref 0.00–0.07)
Basophils Absolute: 0 10*3/uL (ref 0.0–0.1)
Basophils Relative: 1 %
Eosinophils Absolute: 0 10*3/uL (ref 0.0–0.5)
Eosinophils Relative: 0 %
HCT: 35 % — ABNORMAL LOW (ref 36.0–46.0)
Hemoglobin: 12 g/dL (ref 12.0–15.0)
Immature Granulocytes: 1 %
Lymphocytes Relative: 5 %
Lymphs Abs: 0.2 10*3/uL — ABNORMAL LOW (ref 0.7–4.0)
MCH: 36.1 pg — ABNORMAL HIGH (ref 26.0–34.0)
MCHC: 34.3 g/dL (ref 30.0–36.0)
MCV: 105.4 fL — ABNORMAL HIGH (ref 80.0–100.0)
Monocytes Absolute: 0.2 10*3/uL (ref 0.1–1.0)
Monocytes Relative: 5 %
Neutro Abs: 3.4 10*3/uL (ref 1.7–7.7)
Neutrophils Relative %: 88 %
Platelets: 91 10*3/uL — ABNORMAL LOW (ref 150–400)
RBC: 3.32 MIL/uL — ABNORMAL LOW (ref 3.87–5.11)
RDW: 15.5 % (ref 11.5–15.5)
WBC: 3.8 10*3/uL — ABNORMAL LOW (ref 4.0–10.5)
nRBC: 0 % (ref 0.0–0.2)

## 2021-11-25 LAB — COMPREHENSIVE METABOLIC PANEL
ALT: 17 U/L (ref 0–44)
AST: 38 U/L (ref 15–41)
Albumin: 3.2 g/dL — ABNORMAL LOW (ref 3.5–5.0)
Alkaline Phosphatase: 54 U/L (ref 38–126)
Anion gap: 8 (ref 5–15)
BUN: 22 mg/dL — ABNORMAL HIGH (ref 6–20)
CO2: 26 mmol/L (ref 22–32)
Calcium: 8.4 mg/dL — ABNORMAL LOW (ref 8.9–10.3)
Chloride: 101 mmol/L (ref 98–111)
Creatinine, Ser: 0.84 mg/dL (ref 0.44–1.00)
GFR, Estimated: >60 mL/min (ref >60)
Glucose, Bld: 97 mg/dL (ref 70–99)
Potassium: 4.4 mmol/L (ref 3.5–5.1)
Sodium: 135 mmol/L (ref 135–145)
Total Bilirubin: 0.6 mg/dL (ref 0.3–1.2)
Total Protein: 5.7 g/dL — ABNORMAL LOW (ref 6.5–8.1)

## 2021-11-25 LAB — RESP PANEL BY RT-PCR (FLU A&B, COVID) ARPGX2
Influenza A by PCR: NEGATIVE
Influenza B by PCR: NEGATIVE
SARS Coronavirus 2 by RT PCR: NEGATIVE

## 2021-11-25 LAB — PROTIME-INR
INR: 0.9 (ref 0.8–1.2)
Prothrombin Time: 11.8 seconds (ref 11.4–15.2)

## 2021-11-25 LAB — CREATININE, SERUM
Creatinine, Ser: 0.78 mg/dL (ref 0.44–1.00)
GFR, Estimated: >60 mL/min (ref >60)

## 2021-11-25 LAB — HIV ANTIBODY (ROUTINE TESTING W REFLEX): HIV Screen 4th Generation wRfx: NONREACTIVE

## 2021-11-25 LAB — MAGNESIUM: Magnesium: 1.8 mg/dL (ref 1.7–2.4)

## 2021-11-25 LAB — PROCALCITONIN: Procalcitonin: 0.11 ng/mL

## 2021-11-25 LAB — LACTIC ACID, PLASMA: Lactic Acid, Venous: 1.7 mmol/L (ref 0.5–1.9)

## 2021-11-25 MED ORDER — ACETAMINOPHEN 650 MG RE SUPP
650.0000 mg | Freq: Four times a day (QID) | RECTAL | Status: DC | PRN
  Filled 2021-11-25: qty 1

## 2021-11-25 MED ORDER — LEVETIRACETAM 100 MG/ML PO SOLN
1500.0000 mg | Freq: Two times a day (BID) | ORAL | Status: DC
  Administered 2021-11-25 – 2021-12-03 (×16): 1500 mg via ORAL
  Filled 2021-11-25 (×18): qty 15

## 2021-11-25 MED ORDER — ENOXAPARIN SODIUM 40 MG/0.4ML IJ SOSY
40.0000 mg | PREFILLED_SYRINGE | INTRAMUSCULAR | Status: DC
  Administered 2021-11-25 – 2021-11-26 (×2): 40 mg via SUBCUTANEOUS
  Filled 2021-11-25 (×2): qty 0.4

## 2021-11-25 MED ORDER — PANTOPRAZOLE SODIUM 40 MG PO TBEC
40.0000 mg | DELAYED_RELEASE_TABLET | Freq: Every day | ORAL | Status: DC
  Administered 2021-11-25 – 2021-12-03 (×9): 40 mg via ORAL
  Filled 2021-11-25 (×9): qty 1

## 2021-11-25 MED ORDER — LACOSAMIDE 50 MG PO TABS
200.0000 mg | ORAL_TABLET | Freq: Two times a day (BID) | ORAL | Status: DC
Start: 2021-11-25 — End: 2021-12-03
  Administered 2021-11-25 – 2021-12-03 (×16): 200 mg via ORAL
  Filled 2021-11-25 (×16): qty 4

## 2021-11-25 MED ORDER — LACTATED RINGERS IV BOLUS (SEPSIS)
500.0000 mL | Freq: Once | INTRAVENOUS | Status: AC
  Administered 2021-11-25: 500 mL via INTRAVENOUS

## 2021-11-25 MED ORDER — ONDANSETRON HCL 4 MG/2ML IJ SOLN: 4.0000 mg | Freq: Four times a day (QID) | INTRAMUSCULAR | Status: DC | PRN

## 2021-11-25 MED ORDER — LORAZEPAM 0.5 MG PO TABS: 0.5000 mg | ORAL_TABLET | ORAL | Status: DC | PRN

## 2021-11-25 MED ORDER — SODIUM CHLORIDE 0.9 % IV SOLN
2.0000 g | INTRAVENOUS | Status: AC
  Administered 2021-11-25 – 2021-11-29 (×5): 2 g via INTRAVENOUS
  Filled 2021-11-25 (×5): qty 20

## 2021-11-25 MED ORDER — PERAMPANEL 2 MG PO TABS
4.0000 mg | ORAL_TABLET | Freq: Every day | ORAL | Status: DC
  Administered 2021-11-25 – 2021-12-02 (×8): 4 mg via ORAL
  Filled 2021-11-25 (×8): qty 2

## 2021-11-25 MED ORDER — SODIUM CHLORIDE 0.9 % IV SOLN
500.0000 mg | INTRAVENOUS | Status: DC
  Administered 2021-11-25: 500 mg via INTRAVENOUS
  Filled 2021-11-25 (×2): qty 5

## 2021-11-25 MED ORDER — ONDANSETRON HCL 4 MG PO TABS: 4.0000 mg | ORAL_TABLET | Freq: Four times a day (QID) | ORAL | Status: DC | PRN

## 2021-11-25 MED ORDER — LACTATED RINGERS IV SOLN: INTRAVENOUS | Status: DC

## 2021-11-25 MED ORDER — AMLODIPINE BESYLATE 10 MG PO TABS
10.0000 mg | ORAL_TABLET | Freq: Every day | ORAL | Status: DC
  Administered 2021-11-26 – 2021-12-03 (×8): 10 mg via ORAL
  Filled 2021-11-25 (×8): qty 1

## 2021-11-25 MED ORDER — METRONIDAZOLE 500 MG/100ML IV SOLN
500.0000 mg | Freq: Once | INTRAVENOUS | Status: AC
  Administered 2021-11-25: 500 mg via INTRAVENOUS
  Filled 2021-11-25: qty 100

## 2021-11-25 MED ORDER — SODIUM CHLORIDE 0.9 % IV SOLN: 2.0000 g | Freq: Two times a day (BID) | INTRAVENOUS | Status: DC

## 2021-11-25 MED ORDER — LISINOPRIL 20 MG PO TABS
20.0000 mg | ORAL_TABLET | Freq: Every day | ORAL | Status: DC
  Administered 2021-11-26 – 2021-12-03 (×8): 20 mg via ORAL
  Filled 2021-11-25 (×8): qty 1

## 2021-11-25 MED ORDER — ACETAMINOPHEN 325 MG PO TABS
650.0000 mg | ORAL_TABLET | Freq: Four times a day (QID) | ORAL | Status: DC | PRN
  Administered 2021-11-25 – 2021-11-29 (×6): 650 mg via ORAL
  Filled 2021-11-25 (×6): qty 2

## 2021-11-25 MED ORDER — DEXAMETHASONE 4 MG PO TABS
2.0000 mg | ORAL_TABLET | Freq: Two times a day (BID) | ORAL | Status: DC
  Administered 2021-11-25 – 2021-12-03 (×16): 2 mg via ORAL
  Filled 2021-11-25 (×16): qty 1

## 2021-11-25 MED ORDER — ACETAMINOPHEN 500 MG PO TABS
1000.0000 mg | ORAL_TABLET | Freq: Once | ORAL | Status: AC
  Administered 2021-11-25: 1000 mg via ORAL
  Filled 2021-11-25: qty 2

## 2021-11-25 MED ORDER — HYDRALAZINE HCL 20 MG/ML IJ SOLN
10.0000 mg | Freq: Four times a day (QID) | INTRAMUSCULAR | Status: DC | PRN
  Administered 2021-11-25: 10 mg via INTRAVENOUS
  Filled 2021-11-25: qty 1

## 2021-11-25 MED ORDER — LACOSAMIDE 200 MG PO TABS: 200.0000 mg | ORAL_TABLET | Freq: Two times a day (BID) | ORAL | Status: DC

## 2021-11-25 MED ORDER — CEFEPIME HCL 2 G IV SOLR
2.0000 g | Freq: Once | INTRAVENOUS | Status: AC
  Administered 2021-11-25: 2 g via INTRAVENOUS
  Filled 2021-11-25: qty 12.5

## 2021-11-25 MED ORDER — VANCOMYCIN HCL 750 MG/150ML IV SOLN
750.0000 mg | INTRAVENOUS | Status: DC
  Filled 2021-11-25: qty 150

## 2021-11-25 MED ORDER — CLONAZEPAM 0.5 MG PO TABS: 0.5000 mg | ORAL_TABLET | ORAL | Status: DC | PRN

## 2021-11-25 MED ORDER — VANCOMYCIN HCL IN DEXTROSE 1-5 GM/200ML-% IV SOLN
1000.0000 mg | Freq: Once | INTRAVENOUS | Status: AC
  Administered 2021-11-25: 1000 mg via INTRAVENOUS
  Filled 2021-11-25: qty 200

## 2021-11-25 MED ORDER — SODIUM CHLORIDE 0.9 % IV SOLN: INTRAVENOUS | Status: DC

## 2021-11-25 MED ORDER — LACTATED RINGERS IV BOLUS (SEPSIS)
1000.0000 mL | Freq: Once | INTRAVENOUS | Status: AC
Start: 2021-11-25 — End: 2021-11-25
  Administered 2021-11-25: 1000 mL via INTRAVENOUS

## 2021-11-25 NOTE — Progress Notes (Signed)
Pharmacy Antibiotic Note  Kristen Foley is a 56 y.o. female admitted on 11/25/2021 presenting with fever, concern for sepsis.  Pharmacy has been consulted for vancomycin and cefepime dosing.  Plan: Vancomycin 1000 mg IV x 1, then 750 mg IV q 24h (eAUC 425, SCr 0.83) Cefepime 2g IV every 12 hours Monitor renal function, Cx and clinical progression to narrow Vancomycin levels as needed     Temp (24hrs), Avg:103.9 F (39.9 C), Min:103.9 F (39.9 C), Max:103.9 F (39.9 C)  Recent Labs  Lab 11/20/21 1035 11/23/21 0924 11/25/21 0940 11/25/21 0950  WBC 7.2 6.4  --  3.8*  CREATININE 0.83 0.78  --  0.84  LATICACIDVEN  --   --  1.7  --     CrCl cannot be calculated (Unknown ideal weight.).    Allergies  Allergen Reactions   Lexapro [Escitalopram Oxalate] Other (See Comments)    "it was too strong for me"   Wellbutrin [Bupropion] Other (See Comments)    "it was too strong for me"   Zoloft [Sertraline Hcl] Other (See Comments)    "it was too strong for me"    Bertis Ruddy, PharmD Clinical Pharmacist ED Pharmacist Phone # [phone] 11/25/2021 11:10 AM

## 2021-11-25 NOTE — ED Provider Notes (Signed)
[facility] EMERGENCY DEPT Provider Note   CSN: [id number] Arrival date & time: 11/25/21  1093     History  Chief Complaint  Patient presents with   Code Sepsis    Kristen Foley is a 56 y.o. 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  Of note, patient recently seen in ED and admitted 11/23/2021 for seizures noted to have resulted in aphasia and right-sided weakness.  Patient admitted this past Friday and discharged yesterday for aphasia and right-sided weakness noted to have seizures.  She has had no further seizures or seizure activity since admission.  However, husband brought her back today because he noted she was hot to the touch today and had a fever with a Tmax of 103.9 F on presentation here with associated wet cough although not productive of any sputum.  He was concerned she may have an infection or pneumonia as she just had a recent CT chest with abnormal findings on the left lung.  She has had no severe headache, neck pain, vomiting, abdominal pain, diarrhea.  Denies any other URI-like symptoms.  She has not been on any antibiotics.  He notes continued aphasia and right-sided weakness.  HPI     Home Medications Prior to Admission medications   Medication Sig Start Date End Date Taking? Authorizing Provider  amLODipine (NORVASC) 10 MG tablet Take 10 mg by mouth daily. 11/21/21   [provider]  Cholecalciferol (VITAMIN D3) 125 MCG (5000 UT) CAPS Take 10,000 Units by mouth daily.    [provider]  clonazePAM (KLONOPIN) 0.5 MG tablet Take 0.5 mg by mouth as needed (seizure). >3 small seizures in a day 09/24/19   [provider]  dexamethasone (DECADRON) 2 MG tablet Take 2 mg by mouth 2 (two) times daily. 11/18/21   [provider]  FYCOMPA 4 MG TABS Take 4 mg by mouth at  bedtime. 05/08/21   [provider]  GLEOSTINE 40 MG capsule Take 120 mg by mouth See admin instructions. Every six weeks 11/08/21   [provider]  lacosamide (VIMPAT) 200 MG TABS tablet Take 1 tablet (200 mg total) by mouth 2 (two) times daily. 11/24/21 12/24/21  Antonieta Pert, MD  levETIRAcetam (KEPPRA) 100 MG/ML solution Take 1,500 mg by mouth 2 (two) times daily. 11/22/21   [provider]  levETIRAcetam (KEPPRA) 1000 MG tablet Take 2 tablets (2,000 mg total) by mouth 2 (two) times daily. Patient taking differently: Take 2,000 mg by mouth 2 (two) times daily. Taking '1500mg'$  in AM, '2000mg'$  in PM 06/09/19 05/28/21  Cameron Sprang, MD  lisinopril (ZESTRIL) 20 MG tablet Take 20 mg by mouth daily. 10/29/21   [provider]  LORazepam (ATIVAN) 0.5 MG tablet Take 0.5 mg by mouth every 4 (four) hours as needed for seizure.    [provider]  Midazolam (NAYZILAM) 5 MG/0.1ML SOLN Place 5 mg into the nose once. 12/09/19   [provider]  ondansetron (ZOFRAN) 8 MG tablet Take by mouth. Patient not taking: Reported on 11/23/2021 10/20/19   [provider]      Allergies    Lexapro [escitalopram oxalate], Wellbutrin [bupropion], and Zoloft [sertraline hcl]    Review of Systems   Review of Systems  Physical Exam Updated Vital Signs BP (!) 141/84   Pulse 76   Temp 99.2 F (37.3 C) (Oral)   Resp 16   SpO2  96%  Physical Exam Constitutional: Alert and following commands. Eyes: Conjunctivae are normal. ENT      Head: Normocephalic and atraumatic.      Nose: No congestion.      Mouth/Throat: Mucous membranes are moist.      Neck: No stridor.  No meningismus Cardiovascular: S1, S2, tachycardic, regular rhythm Respiratory: Tachypneic mid 20s, clear breath sounds, O2 sat 94-96 on RA Gastrointestinal: Soft and nontender. There is no CVA tenderness. Musculoskeletal:       Right lower leg: No tenderness or edema.      Left lower leg: No tenderness or  edema. Neurologic: Aphasic, right-sided weakness, unable to lift right lower extremity against gravity, very weak grip strength on the right, full strength on left, PERRL, EOMI Skin: Skin is warm, dry and intact. No rash noted. Psychiatric: Mood and affect are normal. Speech and behavior are normal.  ED Results / Procedures / Treatments   Labs (all labs ordered are listed, but only abnormal results are displayed) Labs Reviewed  COMPREHENSIVE METABOLIC PANEL - Abnormal; Notable for the following components:      Result Value   BUN 22 (*)    Calcium 8.4 (*)    Total Protein 5.7 (*)    Albumin 3.2 (*)    All other components within normal limits  CBC WITH DIFFERENTIAL/PLATELET - Abnormal; Notable for the following components:   WBC 3.8 (*)    RBC 3.32 (*)    HCT 35.0 (*)    MCV 105.4 (*)    MCH 36.1 (*)    Platelets 91 (*)    Lymphs Abs 0.2 (*)    All other components within normal limits  RESP PANEL BY RT-PCR (FLU A&B, COVID) ARPGX2  CULTURE, BLOOD (ROUTINE X 2)  CULTURE, BLOOD (ROUTINE X 2)  LACTIC ACID, PLASMA  PROTIME-INR  LACTIC ACID, PLASMA  URINALYSIS, ROUTINE W REFLEX MICROSCOPIC    EKG EKG Interpretation  Date/Time:  Sunday November 25 2021 09:42:49 EDT Ventricular Rate:  105 PR Interval:  131 QRS Duration: 102 QT Interval:  314 QTC Calculation: 415 R Axis:   64 Text Interpretation: Sinus tachycardia Confirmed by Georgina Snell [phone]) on 11/25/2021 10:28:48 AM  Radiology CT Head Wo Contrast  Result Date: 11/25/2021 CLINICAL DATA:  GBM with worsening right-sided weakness EXAM: CT HEAD WITHOUT CONTRAST TECHNIQUE: Contiguous axial images were obtained from the base of the skull through the vertex without intravenous contrast. RADIATION DOSE REDUCTION: This exam was performed according to the departmental dose-optimization program which includes automated exposure control, adjustment of the mA and/or kV according to patient size and/or use of iterative  reconstruction technique. COMPARISON:  CT head and brain MRI dated 2 days prior FINDINGS: Brain: A hypodense mass is again seen in the right frontal lobe consistent with glioma with surrounding edema and/or infiltrative tumor. The appearance is unchanged compared to the study from 2 days prior. There is partial effacement of the adjacent left lateral ventricle but no midline shift. There is no evidence of acute intracranial hemorrhage, extra-axial fluid collection, or acute infarct Background parenchymal volume is normal. The ventricles are otherwise normal in size. Vascular: No hyperdense vessel or unexpected calcification. Skull: Normal. Negative for fracture or focal lesion. Sinuses/Orbits: The imaged paranasal sinuses are clear. The globes and orbits are unremarkable. Other: None. IMPRESSION: 1. Stable left frontal lobe mass consistent with glioma with surrounding edema and/or infiltrative tumor with mild regional mass effect but no midline shift. 2. No new acute intracranial pathology. Electronically Signed  By: Valetta Mole M.D.   On: 11/25/2021 11:32   DG Chest Port 1 View  Result Date: 11/25/2021 CLINICAL DATA:  Sepsis protocol. EXAM: PORTABLE CHEST 1 VIEW COMPARISON:  11/23/2021 FINDINGS: Right lung clear. Nodular opacity in the peripheral left mid lung is slightly more conspicuous on the current exam. There is some minimal atelectasis or infiltrate at the left base, similar to prior. The cardiopericardial silhouette is within normal limits for size. Telemetry leads overlie the chest. IMPRESSION: 1. Slight interval increase in conspicuity of the nodular opacity in the peripheral left mid lung. 2. Persistent atelectasis or infiltrate at the left base. Electronically Signed   By: Misty [name] M.D.   On: 11/25/2021 11:10   CT CHEST WO CONTRAST  Result Date: 11/24/2021 CLINICAL DATA:  Follow-up for abnormal chest radiograph. Possible left mid lung nodule. EXAM: CT CHEST WITHOUT CONTRAST TECHNIQUE:  Multidetector CT imaging of the chest was performed following the standard protocol without IV contrast. RADIATION DOSE REDUCTION: This exam was performed according to the departmental dose-optimization program which includes automated exposure control, adjustment of the mA and/or kV according to patient size and/or use of iterative reconstruction technique. COMPARISON:  Chest radiograph, 11/23/2021.  Chest CT, 06/02/2019. FINDINGS: Cardiovascular: Heart is normal in size and configuration. No pericardial effusion. No coronary artery calcifications. Great vessels are normal in caliber. Mediastinum/Nodes: No enlarged mediastinal or axillary lymph nodes. Thyroid gland, trachea, and esophagus demonstrate no significant findings. Lungs/Pleura: Central cavitary nodule, left upper lobe posteriorly and laterally, centered on image 63, series 5, with smaller contiguous nodules extending from its posterior margin toward the lateral aspect of the oblique fissure. Nodule measures 12 mm. Nodule and the adjacent posterior nodularity spans a total of 2.2 cm. This corresponds to the opacity noted on the current chest radiograph and is new from the prior chest CT. Small stable nodules, 3 mm nodule, right upper lobe, image 40, series 5, Peri fissural nodule, 4 mm, image 82, series 5 and right lower lobe nodule, 3-4 mm, image 84, series 5. Given the stability for over 2 years, these are benign. No other pulmonary nodules. Lungs demonstrate interstitial thickening, lower lung predominance, and basilar opacities in the lower lobes consistent with atelectasis. No pleural effusion.  No pneumothorax. Upper Abdomen: Unremarkable. Musculoskeletal: No fracture or acute finding.  No bone lesion. Small rounded mass in the right breast, 5-6 mm, benign and stable from mammograms dated 2019 in 2017. IMPRESSION: 1. 12 mm nodule in the left upper lobe corresponding to the chest radiographic abnormality. This has central cavitation. Prior chest  radiographic history reports fever and history of glioblastoma. Since this nodule may be inflammatory, recommend short-term follow-up unenhanced chest CT in 2-3 months to reassess. 2. Bilateral interstitial thickening, mostly in the lower lungs, which is new from the prior CT. Consider interstitial edema if the patient has consistent clinical symptoms. 3. Small stable nodules in the right lung, benign with no follow-up recommended. Electronically Signed   By: Lajean Manes M.D.   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  EEG to evaluate for seizure Level of alertness: Awake, asleep  AEDs during EEG study: LEV, LCM, Fycompa Technical aspects: This EEG study was done with scalp electrodes positioned according to the 10-20 International system of electrode placement. Electrical activity was reviewed with band pass filter of 1-'70Hz'$ , sensitivity of 7 uV/mm, display speed of 61m/sec with a '60Hz'$  notched filter applied as appropriate. EEG data were recorded continuously and digitally stored.  Video monitoring was available and reviewed as appropriate. Description: The posterior dominant rhythm consists of 8 Hz activity of moderate voltage (25-35 uV) seen predominantly in posterior head regions, asymmetric( left<right) and reactive to eye opening and eye closing. Sleep was characterized by vertex waves, sleep spindles (12 to 14 Hz), maximal frontocentral region. EEG showed continuous 3 to 6 Hz theta-delta slowing in left hemisphere, maximal left temporal  region. Hyperventilation and photic stimulation were not performed.   ABNORMALITY - Continuous slow, left hemisphere, maximal left temporal region - Background asymmetry, left<right IMPRESSION: This study is suggestive of cortical dysfunction arising from left hemisphere, maximal left temporal region likely secondary to underlying structural abnormality. No seizures or epileptiform discharges were seen throughout the recording. Priyanka [name]   Procedures Procedures  Remain on constant cardiac monitoring, initial sinus tachycardia, repeat normal sinus rhythm  Medications Ordered in ED Medications  lactated ringers infusion ( Intravenous New Bag/Given 11/25/21 1307)  ceFEPIme (MAXIPIME) 2 g in sodium chloride 0.9 % 100 mL IVPB (has no administration in time range)  vancomycin (VANCOREADY) IVPB 750 mg/150 mL (has no administration in time range)  acetaminophen (TYLENOL) tablet 1,000 mg (1,000 mg Oral Given 11/25/21 0944)  lactated ringers bolus 1,000 mL (1,000 mLs Intravenous New Bag/Given 11/25/21 1047)    And  lactated ringers bolus 500 mL (500 mLs Intravenous Bolus from Bag 11/25/21 1308)  ceFEPIme (MAXIPIME) 2 g in sodium chloride 0.9 % 100 mL IVPB (0 g Intravenous Stopped 11/25/21 1043)  metroNIDAZOLE (FLAGYL) IVPB 500 mg (500 mg Intravenous New Bag/Given 11/25/21 1048)  vancomycin (VANCOCIN) IVPB 1000 mg/200 mL premix (1,000 mg Intravenous New Bag/Given 11/25/21 1204)    ED Course/ Medical Decision Making/ A&P Clinical Course as of 11/25/21 1332  Sun Nov 25, 2021  1157 Patient's work-up remarkable for leukopenia white blood cell count 3.8 with decreased lymphocyte count, thrombocytopenia at 91, no anemia hemoglobin 12.  Creatinine 0.84.  No acute electrolyte abnormalities.  Lactate 1.7.  COVID and flu both negative.  Chest x-ray with left upper lobe consolidation on personal interpretation as well as noted to be increased from prior per radiology.  With recent hospitalization and complex  medical history, will page hospitalist for admission for continued IV fluids and antibiotics and monitoring. [VB]  1225 CT head unchanged with no ICH from recent imaging.  Discussed case with Dr. PDoristine Bosworthwho is excepted [facility] of concern for sepsis secondary to pneumonia. [VB]    Clinical Course User Index [VB] BElgie Congo MD                           Medical Decision Making  Kristen VAETHis a 56y.o. 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  Of note, patient recently seen in ED and admitted 11/23/2021 for seizures noted to have resulted in aphasia and right-sided weakness.  Patient presented febrile 39.9 C, tachycardic 111, tachypneic 26 satting 94 to 97% on  room air with stable blood pressure 138/73.  Based on patient's presentation with fever and cough and recent chest CT obtained yesterday with suspicious left upper lobe nodule concerning for possible bacterial pneumonia vs viral URI, will cover for sepsis with broad-spectrum antibiotics and IV fluids and anticipate admission.  We will also evaluate UA for infection.  She has no neck pain, vomiting, severe headache, encephalopathy or other symptoms suggestive of meningitis.  Of note due to her recent admission, she was found to have aphasia and right-sided weakness attributed to seizures.  She has had no recent seizures but endorses worsening right-sided weakness, will repeat CT head for those reasons.  Chest CT 8/19: "IMPRESSION: 1. 12 mm nodule in the left upper lobe corresponding to the chest radiographic abnormality. This has central cavitation. Prior chest radiographic history reports fever and history of glioblastoma. Since this nodule may be inflammatory, recommend short-term follow-up unenhanced chest CT in  2-3 months to reassess. 2. Bilateral interstitial thickening, mostly in the lower lungs, which is new from the prior CT. Consider interstitial edema if the patient has consistent clinical symptoms. 3. Small stable nodules in the right lung, benign with no follow-up recommended.  "  Amount and/or Complexity of Data Reviewed Labs: ordered. Decision-making details documented in ED Course. Radiology: ordered and independent interpretation performed. Decision-making details documented in ED Course.    Details: Chest x-ray with left upper lobe consolidation on personal interpretation ECG/medicine tests: ordered and independent interpretation performed. Decision-making details documented in ED Course.  Risk OTC drugs. Prescription drug management. Decision regarding hospitalization.    Final Clinical Impression(s) / ED Diagnoses Final diagnoses:  Community acquired pneumonia of left upper lobe of lung    Rx / DC Orders ED Discharge Orders     None         Elgie Congo, MD 11/25/21 1332

## 2021-11-25 NOTE — Progress Notes (Signed)
   11/25/21 2102  Assess: MEWS Score  Temp (!) 102.7 F (39.3 C)  BP (!) 172/88  MAP (mmHg) 113  Pulse Rate (!) 102  Resp 18  SpO2 96 %  O2 Device Room Air  Assess: MEWS Score  MEWS Temp 2  MEWS Systolic 0  MEWS Pulse 1  MEWS RR 0  MEWS LOC 0  MEWS Score 3  MEWS Score Color Yellow  Assess: if the MEWS score is Yellow or Red  Were vital signs taken at a resting state? Yes  Focused Assessment Change from prior assessment (see assessment flowsheet)  Does the patient meet 2 or more of the SIRS criteria? Yes  Does the patient have a confirmed or suspected source of infection? Yes  Provider and Rapid Response Notified? Yes  MEWS guidelines implemented *See Row Information* Yes  Treat  MEWS Interventions Administered scheduled meds/treatments;Administered prn meds/treatments;Escalated (See documentation below)  Pain Scale PAINAD  Pain Score 0  Faces Pain Scale 0  Take Vital Signs  Increase Vital Sign Frequency  Yellow: Q 2hr X 2 then Q 4hr X 2, if remains yellow, continue Q 4hrs  Escalate  MEWS: Escalate Yellow: discuss with charge nurse/RN and consider discussing with provider and RRT  Notify: Charge Nurse/RN  Name of Charge Nurse/RN Notified Rupinder, RN  Date Charge Nurse/RN Notified 11/25/21  Time Charge Nurse/RN Notified 2105  Notify: Provider  Provider Name/Title E. Stark Klein, NP  Date Provider Notified 11/25/21  Time Provider Notified 2105  Method of Notification Page  Notification Reason Change in status  Provider response See new orders;Evaluate remotely  Date of Provider Response 11/25/21  Time of Provider Response 2015  Document  Progress note created (see row info) Yes  Assess: SIRS CRITERIA  SIRS Temperature  1  SIRS Pulse 1  SIRS Respirations  0  SIRS WBC 1  SIRS Score Sum  3

## 2021-11-25 NOTE — Sepsis Progress Note (Signed)
Sepsis protocol is being followed by [facility]. 
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
## 2021-11-25 NOTE — ED Triage Notes (Signed)
Pt CA pt presents this morning w/fever at home.  Pt was admitted to [facility] on Friday for seizure-like acitvity, d/c'd yesterday and awoke with fever today.  Pt minimally verbal due to [place] - husband at bedside reporting for pt.  Pt has minimal use of right leg and arm.

## 2021-11-25 NOTE — ED Notes (Signed)
IV unsuccessful. 2nd RN to attempt with [place].

## 2021-11-26 DIAGNOSIS — I10 Essential (primary) hypertension: Secondary | ICD-10-CM

## 2021-11-26 DIAGNOSIS — C719 Malignant neoplasm of brain, unspecified: Secondary | ICD-10-CM | POA: Diagnosis not present

## 2021-11-26 DIAGNOSIS — A419 Sepsis, unspecified organism: Secondary | ICD-10-CM | POA: Diagnosis not present

## 2021-11-26 DIAGNOSIS — J189 Pneumonia, unspecified organism: Secondary | ICD-10-CM | POA: Diagnosis not present

## 2021-11-26 LAB — CBC
HCT: 29.9 % — ABNORMAL LOW (ref 36.0–46.0)
Hemoglobin: 9.8 g/dL — ABNORMAL LOW (ref 12.0–15.0)
MCH: 35.6 pg — ABNORMAL HIGH (ref 26.0–34.0)
MCHC: 32.8 g/dL (ref 30.0–36.0)
MCV: 108.7 fL — ABNORMAL HIGH (ref 80.0–100.0)
Platelets: 82 10*3/uL — ABNORMAL LOW (ref 150–400)
RBC: 2.75 MIL/uL — ABNORMAL LOW (ref 3.87–5.11)
RDW: 15.7 % — ABNORMAL HIGH (ref 11.5–15.5)
WBC: 3 10*3/uL — ABNORMAL LOW (ref 4.0–10.5)
nRBC: 0 % (ref 0.0–0.2)

## 2021-11-26 LAB — URINALYSIS, ROUTINE W REFLEX MICROSCOPIC
Bacteria, UA: NONE SEEN
Bilirubin Urine: NEGATIVE
Glucose, UA: NEGATIVE mg/dL
Ketones, ur: NEGATIVE mg/dL
Leukocytes,Ua: NEGATIVE
Nitrite: NEGATIVE
Protein, ur: 100 mg/dL — AB
Specific Gravity, Urine: 1.011 (ref 1.005–1.030)
pH: 7 (ref 5.0–8.0)

## 2021-11-26 LAB — COMPREHENSIVE METABOLIC PANEL
ALT: 19 U/L (ref 0–44)
AST: 26 U/L (ref 15–41)
Albumin: 2.2 g/dL — ABNORMAL LOW (ref 3.5–5.0)
Alkaline Phosphatase: 42 U/L (ref 38–126)
Anion gap: 3 — ABNORMAL LOW (ref 5–15)
BUN: 20 mg/dL (ref 6–20)
CO2: 24 mmol/L (ref 22–32)
Calcium: 7.6 mg/dL — ABNORMAL LOW (ref 8.9–10.3)
Chloride: 115 mmol/L — ABNORMAL HIGH (ref 98–111)
Creatinine, Ser: 0.82 mg/dL (ref 0.44–1.00)
GFR, Estimated: >60 mL/min (ref >60)
Glucose, Bld: 118 mg/dL — ABNORMAL HIGH (ref 70–99)
Potassium: 3.9 mmol/L (ref 3.5–5.1)
Sodium: 142 mmol/L (ref 135–145)
Total Bilirubin: 0.4 mg/dL (ref 0.3–1.2)
Total Protein: 4.3 g/dL — ABNORMAL LOW (ref 6.5–8.1)

## 2021-11-26 LAB — STREP PNEUMONIAE URINARY ANTIGEN: Strep Pneumo Urinary Antigen: NEGATIVE

## 2021-11-26 MED ORDER — AZITHROMYCIN 250 MG PO TABS
500.0000 mg | ORAL_TABLET | ORAL | Status: AC
  Administered 2021-11-26 – 2021-11-29 (×4): 500 mg via ORAL
  Filled 2021-11-26 (×4): qty 2

## 2021-11-26 NOTE — Progress Notes (Signed)
PROGRESS NOTE    Kristen Foley  WCB:[id number] DOB: Jan 27, 1966 DOA: 11/25/2021 PCP: Unk Pinto, MD    Brief Narrative:  56 year old female with a history of GBM, seizures, admitted to the hospital with altered mental status and high fever.  Chest imaging indicated possible underlying pneumonia.  Started on broad-spectrum antibiotics.  Sepsis work-up in process.   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[place])   Expressive aphasia   CAP (community acquired pneumonia)   Essential hypertension   Sepsis -Secondary to possible pneumonia -Blood cultures in process, will also check urine studies -On admission, she was tachycardic, leukopenic and had altered mental status -Continue broad-spectrum antibiotics  Acute metabolic encephalopathy -Secondary to sepsis -Family notes that she was less responsive at home -CT head on admission unremarkable -Continue to monitor  Seizure disorder -Related to underlying brain mass -Continue on home seizure medicines including Keppra, Vimpat, as needed benzos  GBM -Followed at [facility] -Continued on chemotherapy -Continue on baseline dexamethasone dose  Thrombocytopenia -Suspect is related to sepsis -Continue to monitor  Hypertension -Continue lisinopril and amlodipine   DVT prophylaxis: enoxaparin (LOVENOX) injection 40 mg Start: 11/25/21 1615  Code Status: Full code Family Communication: Updated patient's husband over the phone Disposition Plan: Status is: Inpatient Remains inpatient appropriate because: Continued IV antibiotics     Consultants:    Procedures:    Antimicrobials:  Ceftriaxone 8/20> Azithromycin 8/20 >   Subjective: Patient has significant aphasia, unable to comprehend speech at this point  Objective: Vitals:   11/26/21 0357 11/26/21 0451 11/26/21  0744 11/26/21 0849  BP: 126/79  131/79 (!) 152/84  Pulse: 83  83 77  Resp: '18  16 16  '$ Temp: 98.7 F (37.1 C)  99.2 F (37.3 C) 98.5 F (36.9 C)  TempSrc: Oral  Oral Oral  SpO2: 92%  92% 92%  Weight:  52 kg    Height:        Intake/Output Summary (Last 24 hours) at 11/26/2021 1332 Last data filed at 11/26/2021 1000 Gross per 24 hour  Intake 2741.11 ml  Output 2700 ml  Net 41.11 ml   Filed Weights   11/25/21 1417 11/26/21 0451  Weight: 52 kg 52 kg    Examination:  General exam: Appears calm and comfortable  Respiratory system: Clear to auscultation. Respiratory effort normal. Cardiovascular system: S1 & S2 heard, RRR. No JVD, murmurs, rubs, gallops or clicks.  Gastrointestinal system: Abdomen is nondistended, soft and nontender. No organomegaly or masses felt. Normal bowel sounds heard. Central nervous system: Right-sided upper and lower extremity weakness which is reportedly at baseline, she has significant expressive aphasia Extremities: No edema bilaterally Skin: No rashes, lesions or ulcers Psychiatry: Somnolent, significant aphasia    Data Reviewed: I have personally reviewed following labs and imaging studies  CBC: Recent Labs  Lab 11/20/21 1035 11/23/21 0924 11/25/21 0950 11/25/21 1644 11/26/21 0412  WBC 7.2 6.4 3.8* 3.8* 3.0*  NEUTROABS 6,653 5.9 3.4  --   --   HGB 12.3 12.4 12.0 11.3* 9.8*  HCT 35.1 36.7 35.0* 34.5* 29.9*  MCV 104.5* 105.8* 105.4* 109.5* 108.7*  PLT 119* 109* 91* 103* 82*   Basic Metabolic Panel: Recent Labs  Lab 11/20/21 1035 11/23/21 0924 11/25/21 0950 11/25/21 1644 11/26/21 0412  NA 141 138 135  --  142  K 4.7 4.4 4.4  --  3.9  CL 105 104 101  --  115*  CO2 '26 24 26  '$ --  24  GLUCOSE 94 132* 97  --  118*  BUN 21 19 22*  --  20  CREATININE 0.83 0.78 0.84 0.78 0.82  CALCIUM 8.9 8.5* 8.4*  --  7.6*  MG  --   --   --  1.8  --    GFR: Estimated Creatinine Clearance: 62.8 mL/min (by C-G formula based on SCr of 0.82  mg/dL). Liver Function Tests: Recent Labs  Lab 11/20/21 1035 11/23/21 0924 11/25/21 0950 11/26/21 0412  AST 20 31 38 26  ALT '16 18 17 19  '$ ALKPHOS  --  64 54 42  BILITOT 0.6 0.7 0.6 0.4  PROT 5.6* 6.2* 5.7* 4.3*  ALBUMIN  --  3.7 3.2* 2.2*   No results for input(s): "LIPASE", "AMYLASE" in the last 168 hours. No results for input(s): "AMMONIA" in the last 168 hours. Coagulation Profile: Recent Labs  Lab 11/23/21 1105 11/25/21 0950  INR 1.0 0.9   Cardiac Enzymes: No results for input(s): "CKTOTAL", "CKMB", "CKMBINDEX", "TROPONINI" in the last 168 hours. BNP (last 3 results) No results for input(s): "PROBNP" in the last 8760 hours. HbA1C: No results for input(s): "HGBA1C" in the last 72 hours. CBG: Recent Labs  Lab 11/23/21 0921  GLUCAP 128*   Lipid Profile: No results for input(s): "CHOL", "HDL", "LDLCALC", "TRIG", "CHOLHDL", "LDLDIRECT" in the last 72 hours. Thyroid Function Tests: No results for input(s): "TSH", "T4TOTAL", "FREET4", "T3FREE", "THYROIDAB" in the last 72 hours. Anemia Panel: No results for input(s): "VITAMINB12", "FOLATE", "FERRITIN", "TIBC", "IRON", "RETICCTPCT" in the last 72 hours. Sepsis Labs: Recent Labs  Lab 11/25/21 0940 11/25/21 1644  PROCALCITON  --  0.11  LATICACIDVEN 1.7  --     Recent Results (from the past 240 hour(s))  SARS Coronavirus 2 by RT PCR (hospital order, performed in [facility] hospital lab) *cepheid single result test* Anterior Nasal Swab     Status: None   Collection Time: 11/23/21 11:05 AM   Specimen: Anterior Nasal Swab  Result Value Ref Range Status   SARS Coronavirus 2 by RT PCR NEGATIVE NEGATIVE Final    Comment: (NOTE) SARS-CoV-2 target nucleic acids are NOT DETECTED.  The SARS-CoV-2 RNA is generally detectable in upper and lower respiratory specimens during the acute phase of infection. The lowest concentration of SARS-CoV-2 viral copies this assay can detect is 250 copies / mL. A negative result does not  preclude SARS-CoV-2 infection and should not be used as the sole basis for treatment or other patient management decisions.  A negative result may occur with improper specimen collection / handling, submission of specimen other than nasopharyngeal swab, presence of viral mutation(s) within the areas targeted by this assay, and inadequate number of viral copies (<250 copies / mL). A negative result must be combined with clinical observations, patient history, and epidemiological information.  Fact Sheet for Patients:   https://www.patel.info/  Fact Sheet for Healthcare Providers: https://hall.com/  This test is not yet approved or  cleared by the [place] FDA and has been authorized for detection and/or diagnosis of SARS-CoV-2 by FDA under an Emergency Use Authorization (EUA).  This EUA will remain in effect (meaning this test can be used) for the duration of the COVID-19 declaration under Section 564(b)(1) of the Act, 21 U.S.C. section 360bbb-3(b)(1), unless the authorization is terminated or revoked sooner.  Performed at [facility], [address] [place], [place] [address]  Urine Culture     Status: Abnormal   Collection Time: 11/23/21  2:00 PM   Specimen: Urine, Clean Catch  Result Value Ref Range Status   Specimen Description   Final    URINE, CLEAN CATCH Performed at [facility], [address] [place], [place] [address]    Special Requests   Final    NONE Performed at [facility], [address] [place], [place] [address]    Culture (A)  Final    <10,000 COLONIES/mL INSIGNIFICANT GROWTH Performed at [facility] [address], [place], [place] [address]    Report Status 11/24/2021 FINAL  Final  Culture, blood (Routine x 2)     Status: None (Preliminary result)   Collection Time: 11/25/21  9:40 AM   Specimen: BLOOD LEFT FOREARM  Result Value Ref  Range Status   Specimen Description   Final    BLOOD LEFT FOREARM Performed at [facility], [address], [place], [place] [address]    Special Requests   Final    BOTTLES DRAWN AEROBIC ONLY Blood Culture results may not be optimal due to an inadequate volume of blood received in culture bottles Performed at [facility], [address] [place], [place] [address]    Culture   Final    NO GROWTH < 12 HOURS Performed at [facility], [address], [place], [place] [address]    Report Status PENDING  Incomplete  Resp Panel by RT-PCR (Flu A&B, Covid) Anterior Nasal Swab     Status: None   Collection Time: 11/25/21  9:50 AM   Specimen: Anterior Nasal Swab  Result Value Ref Range Status   SARS Coronavirus 2 by RT PCR NEGATIVE NEGATIVE Final    Comment: (NOTE) SARS-CoV-2 target nucleic acids are NOT DETECTED.  The SARS-CoV-2 RNA is generally detectable in upper respiratory specimens during the acute phase of infection. The lowest concentration of SARS-CoV-2 viral copies this assay can detect is 138 copies/mL. A negative result does not preclude SARS-Cov-2 infection and should not be used as the sole basis for treatment or other patient management decisions. A negative result may occur with  improper specimen collection/handling, submission of specimen other than nasopharyngeal swab, presence of viral mutation(s) within the areas targeted by this assay, and inadequate number of viral copies(<138 copies/mL). A negative result must be combined with clinical observations, patient history, and epidemiological information. The expected result is Negative.  Fact Sheet for Patients:  EntrepreneurPulse.com.au  Fact Sheet for Healthcare Providers:  IncredibleEmployment.be  This test is no t yet approved or cleared by the [place] FDA and  has been authorized for detection and/or diagnosis of  SARS-CoV-2 by FDA under an Emergency Use Authorization (EUA). This EUA will remain  in effect (meaning this test can be used) for the duration of the COVID-19 declaration under Section 564(b)(1) of the Act, 21 U.S.C.section 360bbb-3(b)(1), unless the authorization is terminated  or revoked sooner.       Influenza A by PCR NEGATIVE NEGATIVE Final   Influenza B by PCR NEGATIVE NEGATIVE Final    Comment: (NOTE) The Xpert Xpress SARS-CoV-2/FLU/RSV plus assay is intended as an aid in the diagnosis of influenza from Nasopharyngeal swab specimens and should not be used as a sole basis for treatment. Nasal washings and aspirates are unacceptable for Xpert Xpress SARS-CoV-2/FLU/RSV testing.  Fact Sheet for Patients: EntrepreneurPulse.com.au  Fact Sheet for Healthcare Providers: IncredibleEmployment.be  This test is not yet approved  or cleared by the [place] and has been authorized for detection and/or diagnosis of SARS-CoV-2 by FDA under an Emergency Use Authorization (EUA). This EUA will remain in effect (meaning this test can be used) for the duration of the COVID-19 declaration under Section 564(b)(1) of the Act, 21 U.S.C. section 360bbb-3(b)(1), unless the authorization is terminated or revoked.  Performed at [facility], [address] [place], [place] [address]          Radiology Studies: CT Head Wo Contrast  Result Date: 11/25/2021 CLINICAL DATA:  GBM with worsening right-sided weakness EXAM: CT HEAD WITHOUT CONTRAST TECHNIQUE: Contiguous axial images were obtained from the base of the skull through the vertex without intravenous contrast. RADIATION DOSE REDUCTION: This exam was performed according to the departmental dose-optimization program which includes automated exposure control, adjustment of the mA and/or kV according to patient size and/or use of iterative reconstruction technique. COMPARISON:  CT  head and brain MRI dated 2 days prior FINDINGS: Brain: A hypodense mass is again seen in the right frontal lobe consistent with glioma with surrounding edema and/or infiltrative tumor. The appearance is unchanged compared to the study from 2 days prior. There is partial effacement of the adjacent left lateral ventricle but no midline shift. There is no evidence of acute intracranial hemorrhage, extra-axial fluid collection, or acute infarct Background parenchymal volume is normal. The ventricles are otherwise normal in size. Vascular: No hyperdense vessel or unexpected calcification. Skull: Normal. Negative for fracture or focal lesion. Sinuses/Orbits: The imaged paranasal sinuses are clear. The globes and orbits are unremarkable. Other: None. IMPRESSION: 1. Stable left frontal lobe mass consistent with glioma with surrounding edema and/or infiltrative tumor with mild regional mass effect but no midline shift. 2. No new acute intracranial pathology. Electronically Signed   By: Valetta Mole M.D.   On: 11/25/2021 11:32   DG Chest Port 1 View  Result Date: 11/25/2021 CLINICAL DATA:  Sepsis protocol. EXAM: PORTABLE CHEST 1 VIEW COMPARISON:  11/23/2021 FINDINGS: Right lung clear. Nodular opacity in the peripheral left mid lung is slightly more conspicuous on the current exam. There is some minimal atelectasis or infiltrate at the left base, similar to prior. The cardiopericardial silhouette is within normal limits for size. Telemetry leads overlie the chest. IMPRESSION: 1. Slight interval increase in conspicuity of the nodular opacity in the peripheral left mid lung. 2. Persistent atelectasis or infiltrate at the left base. Electronically Signed   By: Misty [name] M.D.   On: 11/25/2021 11:10        Scheduled Meds:  amLODipine  10 mg Oral Daily   dexamethasone  2 mg Oral BID   enoxaparin (LOVENOX) injection  40 mg Subcutaneous Q24H   lacosamide  200 mg Oral BID   levETIRAcetam  1,500 mg Oral BID    lisinopril  20 mg Oral Daily   pantoprazole  40 mg Oral Daily   perampanel  4 mg Oral QHS   Continuous Infusions:  sodium chloride 100 mL/hr at 11/26/21 0400   azithromycin 500 mg (11/25/21 1623)   cefTRIAXone (ROCEPHIN)  IV 2 g (11/25/21 2149)     LOS: 1 day    Time spent: 93mns    JKathie Dike MD [facility]   If 7PM-7AM, please contact night-coverage [email or web address]  11/26/2021, 1:32 PM

## 2021-11-26 NOTE — Progress Notes (Signed)
PHARMACIST - PHYSICIAN COMMUNICATION  CONCERNING: Antibiotic IV to Oral Route Change Policy  RECOMMENDATION: This patient is receiving azithromycin by the intravenous route.  Based on criteria approved by the Pharmacy and Therapeutics Committee, the antibiotic(s) is/are being converted to the equivalent oral dose form(s).   DESCRIPTION: These criteria include:  Patient being treated for a respiratory tract infection, urinary tract infection, cellulitis or clostridium difficile associated diarrhea if on metronidazole  The patient is not neutropenic and does not exhibit a GI malabsorption state  The patient is eating (either orally or via tube) and/or has been taking other orally administered medications for a least 24 hours  The patient is improving clinically and has a Tmax < 100.5  If you have questions about this conversion, please contact the Pharmacy Department  []  ( [phone] )  [place] []  ( [phone] )  [facility] []  ( [phone] )  [place] []  ( [phone] )  [facility] [x]  ( [phone] )   [facility]  

## 2021-11-26 NOTE — Progress Notes (Signed)
No seizure pads available per portables.

## 2021-11-27 ENCOUNTER — Ambulatory Visit: Payer: 59 | Admitting: Internal Medicine

## 2021-11-27 DIAGNOSIS — A419 Sepsis, unspecified organism: Secondary | ICD-10-CM | POA: Diagnosis not present

## 2021-11-27 DIAGNOSIS — C719 Malignant neoplasm of brain, unspecified: Secondary | ICD-10-CM | POA: Diagnosis not present

## 2021-11-27 DIAGNOSIS — J189 Pneumonia, unspecified organism: Secondary | ICD-10-CM | POA: Diagnosis not present

## 2021-11-27 DIAGNOSIS — I10 Essential (primary) hypertension: Secondary | ICD-10-CM | POA: Diagnosis not present

## 2021-11-27 LAB — CBC
HCT: 28.4 % — ABNORMAL LOW (ref 36.0–46.0)
Hemoglobin: 9.3 g/dL — ABNORMAL LOW (ref 12.0–15.0)
MCH: 35.8 pg — ABNORMAL HIGH (ref 26.0–34.0)
MCHC: 32.7 g/dL (ref 30.0–36.0)
MCV: 109.2 fL — ABNORMAL HIGH (ref 80.0–100.0)
Platelets: 74 10*3/uL — ABNORMAL LOW (ref 150–400)
RBC: 2.6 MIL/uL — ABNORMAL LOW (ref 3.87–5.11)
RDW: 15.7 % — ABNORMAL HIGH (ref 11.5–15.5)
WBC: 3.1 10*3/uL — ABNORMAL LOW (ref 4.0–10.5)
nRBC: 0 % (ref 0.0–0.2)

## 2021-11-27 LAB — COMPREHENSIVE METABOLIC PANEL
ALT: 16 U/L (ref 0–44)
AST: 25 U/L (ref 15–41)
Albumin: 1.9 g/dL — ABNORMAL LOW (ref 3.5–5.0)
Alkaline Phosphatase: 40 U/L (ref 38–126)
Anion gap: 3 — ABNORMAL LOW (ref 5–15)
BUN: 14 mg/dL (ref 6–20)
CO2: 24 mmol/L (ref 22–32)
Calcium: 7.3 mg/dL — ABNORMAL LOW (ref 8.9–10.3)
Chloride: 115 mmol/L — ABNORMAL HIGH (ref 98–111)
Creatinine, Ser: 0.69 mg/dL (ref 0.44–1.00)
GFR, Estimated: >60 mL/min (ref >60)
Glucose, Bld: 138 mg/dL — ABNORMAL HIGH (ref 70–99)
Potassium: 4.1 mmol/L (ref 3.5–5.1)
Sodium: 142 mmol/L (ref 135–145)
Total Bilirubin: 0.5 mg/dL (ref 0.3–1.2)
Total Protein: 4 g/dL — ABNORMAL LOW (ref 6.5–8.1)

## 2021-11-27 LAB — URINE CULTURE: Culture: NO GROWTH

## 2021-11-27 LAB — LEGIONELLA PNEUMOPHILA SEROGP 1 UR AG: L. pneumophila Serogp 1 Ur Ag: NEGATIVE

## 2021-11-27 MED ORDER — IPRATROPIUM-ALBUTEROL 0.5-2.5 (3) MG/3ML IN SOLN
3.0000 mL | Freq: Four times a day (QID) | RESPIRATORY_TRACT | Status: DC
  Administered 2021-11-27 – 2021-11-28 (×6): 3 mL via RESPIRATORY_TRACT
  Filled 2021-11-27 (×6): qty 3

## 2021-11-27 MED ORDER — GUAIFENESIN ER 600 MG PO TB12
600.0000 mg | ORAL_TABLET | Freq: Two times a day (BID) | ORAL | Status: DC
  Administered 2021-11-27 – 2021-11-28 (×3): 600 mg via ORAL
  Filled 2021-11-27 (×3): qty 1

## 2021-11-27 MED ORDER — HYDROCODONE BIT-HOMATROP MBR 5-1.5 MG/5ML PO SOLN: 5.0000 mL | Freq: Four times a day (QID) | ORAL | Status: DC | PRN

## 2021-11-27 MED ORDER — HYDROCOD POLI-CHLORPHE POLI ER 10-8 MG/5ML PO SUER
5.0000 mL | Freq: Two times a day (BID) | ORAL | Status: DC | PRN
Start: 2021-11-27 — End: 2021-11-27

## 2021-11-27 NOTE — Progress Notes (Signed)
  Transition of Care [facility]) Screening Note   Patient Details  Name: Kristen Foley Date of Birth: [birth year]   Transition of [facility]) CM/SW Contact:    Lennart Pall, LCSW Phone Number: 11/27/2021, 11:30 AM    Transition of Care Department [facility]) has reviewed patient and no TOC needs have been identified at this time. We will continue to monitor patient advancement through interdisciplinary progression rounds. If new patient transition needs arise, please place a TOC consult.

## 2021-11-27 NOTE — Progress Notes (Signed)
PROGRESS NOTE    Kristen Foley  XBL:[id number] DOB: Aug 21, 1965 DOA: 11/25/2021 PCP: Unk Pinto, MD    Brief Narrative:  56 year old female with a history of GBM, seizures, admitted to the hospital with altered mental status and high fever.  Chest imaging indicated possible underlying pneumonia.  Started on broad-spectrum antibiotics.  Sepsis work-up in process.   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[place])   Expressive aphasia   CAP (community acquired pneumonia)   Essential hypertension   Sepsis -Secondary to possible pneumonia -Blood cultures have not shown any growth to date -Urinalysis without signs of infection -On admission, she was tachycardic, leukopenic and had altered mental status -Continue broad-spectrum antibiotics -Overall fevers appear to be improving  Acute metabolic encephalopathy -Secondary to sepsis -Family notes that she was less responsive at home -CT head on admission unremarkable -Overall appears to be improving -Continue to monitor  Seizure disorder -Related to underlying brain mass -Continue on home seizure medicines including Keppra, Vimpat, as needed benzos  GBM -Followed at [facility] -Continued on chemotherapy -Continue on baseline dexamethasone dose  Thrombocytopenia -Suspect is related to sepsis -Continue to monitor -Discontinue prophylactic dose of Lovenox and use SCDs  Hypertension -Continue lisinopril and amlodipine  Generalized weakness -PT/OT   DVT prophylaxis: Place and maintain sequential compression device Start: 11/27/21 1459  Code Status: Full code Family Communication: Updated patient's husband at the bedside 8/22 Disposition Plan: Status is: Inpatient Remains inpatient appropriate because: Continued IV antibiotics     Consultants:    Procedures:     Antimicrobials:  Ceftriaxone 8/20> Azithromycin 8/20 >   Subjective: Appears to be coughing more today.  Has discomfort in her chest due to cough.  Objective: Vitals:   11/27/21 0547 11/27/21 0938 11/27/21 1348 11/27/21 1446  BP: 133/79 130/74 (!) 143/82   Pulse: 88 80 100   Resp: '18 18 18   '$ Temp: 98.4 F (36.9 C) 98.6 F (37 C) 98.2 F (36.8 C)   TempSrc: Oral Oral Oral   SpO2: 95% 96% 95% 93%  Weight:      Height:        Intake/Output Summary (Last 24 hours) at 11/27/2021 1459 Last data filed at 11/27/2021 1400 Gross per 24 hour  Intake 3643.43 ml  Output 2550 ml  Net 1093.43 ml   Filed Weights   11/25/21 1417 11/26/21 0451  Weight: 52 kg 52 kg    Examination:  General exam: Appears calm and comfortable  Respiratory system: Clear to auscultation. Respiratory effort normal. Cardiovascular system: S1 & S2 heard, RRR. No JVD, murmurs, rubs, gallops or clicks.  Gastrointestinal system: Abdomen is nondistended, soft and nontender. No organomegaly or masses felt. Normal bowel sounds heard. Central nervous system: Right-sided upper and lower extremity weakness which is reportedly at baseline, she has significant expressive aphasia Extremities: No edema bilaterally Skin: No rashes, lesions or ulcers Psychiatry: More awake today, speech limited by aphasia    Data Reviewed: I have personally reviewed following labs and imaging studies  CBC: Recent Labs  Lab 11/23/21 0924 11/25/21 0950 11/25/21 1644 11/26/21 0412 11/27/21 0422  WBC 6.4 3.8* 3.8* 3.0* 3.1*  NEUTROABS 5.9 3.4  --   --   --   HGB 12.4 12.0 11.3* 9.8* 9.3*  HCT 36.7 35.0* 34.5* 29.9* 28.4*  MCV 105.8* 105.4* 109.5* 108.7* 109.2*  PLT 109* 91*  103* 82* 74*   Basic Metabolic Panel: Recent Labs  Lab 11/23/21 0924 11/25/21 0950 11/25/21 1644 11/26/21 0412 11/27/21 0422  NA 138 135  --  142 142  K 4.4 4.4  --  3.9 4.1  CL 104 101  --  115* 115*  CO2 24 26  --  24 24  GLUCOSE 132* 97  --   118* 138*  BUN 19 22*  --  20 14  CREATININE 0.78 0.84 0.78 0.82 0.69  CALCIUM 8.5* 8.4*  --  7.6* 7.3*  MG  --   --  1.8  --   --    GFR: Estimated Creatinine Clearance: 64.3 mL/min (by C-G formula based on SCr of 0.69 mg/dL). Liver Function Tests: Recent Labs  Lab 11/23/21 0924 11/25/21 0950 11/26/21 0412 11/27/21 0422  AST 31 38 26 25  ALT '18 17 19 16  '$ ALKPHOS 64 54 42 40  BILITOT 0.7 0.6 0.4 0.5  PROT 6.2* 5.7* 4.3* 4.0*  ALBUMIN 3.7 3.2* 2.2* 1.9*   No results for input(s): "LIPASE", "AMYLASE" in the last 168 hours. No results for input(s): "AMMONIA" in the last 168 hours. Coagulation Profile: Recent Labs  Lab 11/23/21 1105 11/25/21 0950  INR 1.0 0.9   Cardiac Enzymes: No results for input(s): "CKTOTAL", "CKMB", "CKMBINDEX", "TROPONINI" in the last 168 hours. BNP (last 3 results) No results for input(s): "PROBNP" in the last 8760 hours. HbA1C: No results for input(s): "HGBA1C" in the last 72 hours. CBG: Recent Labs  Lab 11/23/21 0921  GLUCAP 128*   Lipid Profile: No results for input(s): "CHOL", "HDL", "LDLCALC", "TRIG", "CHOLHDL", "LDLDIRECT" in the last 72 hours. Thyroid Function Tests: No results for input(s): "TSH", "T4TOTAL", "FREET4", "T3FREE", "THYROIDAB" in the last 72 hours. Anemia Panel: No results for input(s): "VITAMINB12", "FOLATE", "FERRITIN", "TIBC", "IRON", "RETICCTPCT" in the last 72 hours. Sepsis Labs: Recent Labs  Lab 11/25/21 0940 11/25/21 1644  PROCALCITON  --  0.11  LATICACIDVEN 1.7  --     Recent Results (from the past 240 hour(s))  SARS Coronavirus 2 by RT PCR (hospital order, performed in [facility] hospital lab) *cepheid single result test* Anterior Nasal Swab     Status: None   Collection Time: 11/23/21 11:05 AM   Specimen: Anterior Nasal Swab  Result Value Ref Range Status   SARS Coronavirus 2 by RT PCR NEGATIVE NEGATIVE Final    Comment: (NOTE) SARS-CoV-2 target nucleic acids are NOT DETECTED.  The SARS-CoV-2 RNA  is generally detectable in upper and lower respiratory specimens during the acute phase of infection. The lowest concentration of SARS-CoV-2 viral copies this assay can detect is 250 copies / mL. A negative result does not preclude SARS-CoV-2 infection and should not be used as the sole basis for treatment or other patient management decisions.  A negative result may occur with improper specimen collection / handling, submission of specimen other than nasopharyngeal swab, presence of viral mutation(s) within the areas targeted by this assay, and inadequate number of viral copies (<250 copies / mL). A negative result must be combined with clinical observations, patient history, and epidemiological information.  Fact Sheet for Patients:   https://www.patel.info/  Fact Sheet for Healthcare Providers: https://hall.com/  This test is not yet approved or  cleared by the [place] FDA and has been authorized for detection and/or diagnosis of SARS-CoV-2 by FDA under an Emergency Use Authorization (EUA).  This EUA will remain in effect (meaning this test can be used) for the duration of the  COVID-19 declaration under Section 564(b)(1) of the Act, 21 U.S.C. section 360bbb-3(b)(1), unless the authorization is terminated or revoked sooner.  Performed at [facility], [address], [place], [place] [address]   Urine Culture     Status: Abnormal   Collection Time: 11/23/21  2:00 PM   Specimen: Urine, Clean Catch  Result Value Ref Range Status   Specimen Description   Final    URINE, CLEAN CATCH Performed at [facility], [address], [place], [place] [address]    Special Requests   Final    NONE Performed at [facility], [address], [place], [place] [address]    Culture (A)  Final    <10,000 COLONIES/mL INSIGNIFICANT GROWTH Performed at [facility] [address], [place], [place] [address]    Report Status 11/24/2021 FINAL  Final  Culture, blood (Routine x 2)     Status: None (Preliminary result)   Collection Time: 11/25/21  9:40 AM   Specimen: BLOOD LEFT FOREARM  Result Value Ref Range Status   Specimen Description   Final    BLOOD LEFT FOREARM Performed at [facility], [address], [place], [place] [address]    Special Requests   Final    BOTTLES DRAWN AEROBIC ONLY Blood Culture results may not be optimal due to an inadequate volume of blood received in culture bottles Performed at [facility], [address], [place], [place] [address]    Culture   Final    NO GROWTH 2 DAYS Performed at [facility], [place] [address] [place], [place] [address]    Report Status PENDING  Incomplete  Resp Panel by RT-PCR (Flu A&B, Covid) Anterior Nasal Swab     Status: None   Collection Time: 11/25/21  9:50 AM   Specimen: Anterior Nasal Swab  Result Value Ref Range Status   SARS Coronavirus 2 by RT PCR NEGATIVE NEGATIVE Final    Comment: (NOTE) SARS-CoV-2 target nucleic acids are NOT DETECTED.  The SARS-CoV-2 RNA is generally detectable in upper respiratory specimens during the acute phase of infection. The lowest concentration of SARS-CoV-2 viral copies this assay can detect is 138 copies/mL. A negative result does not preclude SARS-Cov-2 infection and should not be used as the sole basis for treatment or other patient management decisions. A negative result may occur with  improper specimen collection/handling, submission of specimen other than nasopharyngeal swab, presence of viral mutation(s) within the areas targeted by this assay, and inadequate number of viral copies(<138 copies/mL). A negative result must be combined with clinical observations, patient history, and epidemiological information. The expected result is Negative.  Fact Sheet for Patients:   EntrepreneurPulse.com.au  Fact Sheet for Healthcare Providers:  IncredibleEmployment.be  This test is no t yet approved or cleared by the [place] FDA and  has been authorized for detection and/or diagnosis of SARS-CoV-2 by FDA under an Emergency Use Authorization (EUA). This EUA will remain  in effect (meaning this test can be used) for the duration of the COVID-19 declaration under Section 564(b)(1) of the Act, 21 U.S.C.section 360bbb-3(b)(1), unless the authorization is terminated  or revoked sooner.       Influenza A by PCR NEGATIVE NEGATIVE Final   Influenza B by PCR NEGATIVE NEGATIVE Final    Comment: (NOTE) The Xpert Xpress SARS-CoV-2/FLU/RSV plus assay is intended as an aid in the diagnosis of influenza from Nasopharyngeal swab specimens and should not be used as a sole  basis for treatment. Nasal washings and aspirates are unacceptable for Xpert Xpress SARS-CoV-2/FLU/RSV testing.  Fact Sheet for Patients: EntrepreneurPulse.com.au  Fact Sheet for Healthcare Providers: IncredibleEmployment.be  This test is not yet approved or cleared by the [place] FDA and has been authorized for detection and/or diagnosis of SARS-CoV-2 by FDA under an Emergency Use Authorization (EUA). This EUA will remain in effect (meaning this test can be used) for the duration of the COVID-19 declaration under Section 564(b)(1) of the Act, 21 U.S.C. section 360bbb-3(b)(1), unless the authorization is terminated or revoked.  Performed at [facility], [address] [place], [place] [address]          Radiology Studies: No results found.      Scheduled Meds:  amLODipine  10 mg Oral Daily   azithromycin  500 mg Oral Q24H   dexamethasone  2 mg Oral BID   guaiFENesin  600 mg Oral BID   ipratropium-albuterol  3 mL Nebulization Q6H   lacosamide  200 mg Oral BID   levETIRAcetam   1,500 mg Oral BID   lisinopril  20 mg Oral Daily   pantoprazole  40 mg Oral Daily   perampanel  4 mg Oral QHS   Continuous Infusions:  cefTRIAXone (ROCEPHIN)  IV 2 g (11/26/21 2039)     LOS: 2 days    Time spent: 56mns    JKathie Dike MD [facility]   If 7PM-7AM, please contact night-coverage [email or web address]  11/27/2021, 2:59 PM

## 2021-11-28 ENCOUNTER — Inpatient Hospital Stay (HOSPITAL_COMMUNITY): Payer: 59

## 2021-11-28 ENCOUNTER — Ambulatory Visit: Payer: 59 | Admitting: Occupational Therapy

## 2021-11-28 DIAGNOSIS — J189 Pneumonia, unspecified organism: Secondary | ICD-10-CM | POA: Diagnosis not present

## 2021-11-28 DIAGNOSIS — A419 Sepsis, unspecified organism: Secondary | ICD-10-CM | POA: Diagnosis not present

## 2021-11-28 DIAGNOSIS — C719 Malignant neoplasm of brain, unspecified: Secondary | ICD-10-CM | POA: Diagnosis not present

## 2021-11-28 DIAGNOSIS — E782 Mixed hyperlipidemia: Secondary | ICD-10-CM

## 2021-11-28 DIAGNOSIS — R4701 Aphasia: Secondary | ICD-10-CM

## 2021-11-28 DIAGNOSIS — R569 Unspecified convulsions: Secondary | ICD-10-CM

## 2021-11-28 DIAGNOSIS — I10 Essential (primary) hypertension: Secondary | ICD-10-CM | POA: Diagnosis not present

## 2021-11-28 LAB — CBC
HCT: 28.7 % — ABNORMAL LOW (ref 36.0–46.0)
Hemoglobin: 9.5 g/dL — ABNORMAL LOW (ref 12.0–15.0)
MCH: 36 pg — ABNORMAL HIGH (ref 26.0–34.0)
MCHC: 33.1 g/dL (ref 30.0–36.0)
MCV: 108.7 fL — ABNORMAL HIGH (ref 80.0–100.0)
Platelets: 65 10*3/uL — ABNORMAL LOW (ref 150–400)
RBC: 2.64 MIL/uL — ABNORMAL LOW (ref 3.87–5.11)
RDW: 15.6 % — ABNORMAL HIGH (ref 11.5–15.5)
WBC: 3.5 10*3/uL — ABNORMAL LOW (ref 4.0–10.5)
nRBC: 0 % (ref 0.0–0.2)

## 2021-11-28 LAB — RENAL FUNCTION PANEL
Albumin: 2 g/dL — ABNORMAL LOW (ref 3.5–5.0)
Anion gap: 4 — ABNORMAL LOW (ref 5–15)
BUN: 16 mg/dL (ref 6–20)
CO2: 24 mmol/L (ref 22–32)
Calcium: 7.6 mg/dL — ABNORMAL LOW (ref 8.9–10.3)
Chloride: 113 mmol/L — ABNORMAL HIGH (ref 98–111)
Creatinine, Ser: 0.7 mg/dL (ref 0.44–1.00)
GFR, Estimated: >60 mL/min (ref >60)
Glucose, Bld: 143 mg/dL — ABNORMAL HIGH (ref 70–99)
Phosphorus: 3.7 mg/dL (ref 2.5–4.6)
Potassium: 4.3 mmol/L (ref 3.5–5.1)
Sodium: 141 mmol/L (ref 135–145)

## 2021-11-28 MED ORDER — POLYETHYLENE GLYCOL 3350 17 G PO PACK
17.0000 g | PACK | Freq: Two times a day (BID) | ORAL | Status: DC
  Administered 2021-11-28 – 2021-12-02 (×5): 17 g via ORAL
  Filled 2021-11-28 (×6): qty 1

## 2021-11-28 MED ORDER — IBUPROFEN 400 MG PO TABS
400.0000 mg | ORAL_TABLET | Freq: Four times a day (QID) | ORAL | Status: DC | PRN
  Administered 2021-11-28: 400 mg via ORAL
  Filled 2021-11-28: qty 1

## 2021-11-28 MED ORDER — IPRATROPIUM-ALBUTEROL 0.5-2.5 (3) MG/3ML IN SOLN
3.0000 mL | Freq: Three times a day (TID) | RESPIRATORY_TRACT | Status: DC
  Administered 2021-11-29 – 2021-12-02 (×10): 3 mL via RESPIRATORY_TRACT
  Filled 2021-11-28 (×9): qty 3

## 2021-11-28 MED ORDER — GUAIFENESIN ER 600 MG PO TB12
1200.0000 mg | ORAL_TABLET | Freq: Two times a day (BID) | ORAL | Status: DC
  Administered 2021-11-28 – 2021-12-03 (×10): 1200 mg via ORAL
  Filled 2021-11-28 (×10): qty 2

## 2021-11-28 MED ORDER — SENNOSIDES-DOCUSATE SODIUM 8.6-50 MG PO TABS
1.0000 | ORAL_TABLET | Freq: Two times a day (BID) | ORAL | Status: DC
Start: 2021-11-28 — End: 2021-12-03
  Administered 2021-11-28 – 2021-12-02 (×6): 1 via ORAL
  Filled 2021-11-28 (×8): qty 1

## 2021-11-28 NOTE — Progress Notes (Signed)
OT Cancellation Note  Patient Details Name: Kristen Foley MRN: [id number] DOB: 1965/11/12   Cancelled Treatment:    Reason Eval/Treat Not Completed: Other (comment) Patient seen after PT session with attempted evaluation. Patient noted to be fatigued and session was stopped at this time. OT to check back on 11/29/21 for assessment. Husband in room with patient at this time.  Jackelyn Poling OTR/L, MS Acute Rehabilitation Department Office# [phone]  11/28/2021, 2:11 PM
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
## 2021-11-28 NOTE — Progress Notes (Signed)
Modified Barium Swallow Progress Note  Patient Details  Name: Kristen Foley MRN: [id number] Date of Birth: 12/23/1965  Today's Date: 11/28/2021  Modified Barium Swallow completed.  Full report located under Chart Review in the Imaging Section.  Brief recommendations include the following:  Clinical Impression  Patient presents with minimal oral dysphagia c/b decreased adequacy of suction formation on straw with nectar liquids.  She was able to adequately form suction with thin liquids however and maintained airway protection with large bolus of thin as well as small.  Minimal oral holding present with liquids but without detriment to swallowing.  Pharyngeal swallow is strong without retentionl nor aspiration/penetration of any consistency tested.  Pharyngeal swallow triggered at vallecular space with solids.   [name] did not cough during entire session.  Did not provide pt with barium tablet due to lack of bowel movement for several days per her spouse. Noted Miralax ordered for her by MD, thank you.   Swallow Evaluation Recommendations       SLP Diet Recommendations: Regular solids;Thin liquid   Liquid Administration via: Cup;Straw   Medication Administration: Whole meds with liquid   Supervision: Patient able to self feed   Compensations: Slow rate;Small sips/bites   Postural Changes: Remain semi-upright after after feeds/meals (Comment)   Oral Care Recommendations: Oral care BID     Kathleen Lime, MS [facility] SLP Acute Rehab Services Office [phone] Pager [phone]    Macario Golds 11/28/2021,1:55 PM
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
## 2021-11-28 NOTE — Evaluation (Signed)
Physical Therapy Evaluation Patient Details Name: Kristen Foley MRN: [id number] DOB: 1965-11-13 Today's Date: 11/28/2021  History of Present Illness  56 year old female with a history of GBM, seizures, admitted to the hospital with altered mental status and high fever. Dx of sepsis, PNA.  Clinical Impression  Pt admitted with above diagnosis. Pt has expressive aphasia, spouse provided information about home set up and pt's prior level of function. Prior to 4-5 days ago, pt ambulated without an assistive device (with a brace for R foot drop) and was able to feed herself with her RUE.  Pt now has profound RUE/LE weakness. She was able to ambulate 8' with the assist of 2 people. Pt's spouse stated he can care for pt at home. They will need a WC, which a family member stated they can loan to pt. Pt currently with functional limitations due to the deficits listed below (see PT Problem List). Pt will benefit from skilled PT to increase their independence and safety with mobility to allow discharge to the venue listed below.          Recommendations for follow up therapy are one component of a multi-disciplinary discharge planning process, led by the attending physician.  Recommendations may be updated based on patient status, additional functional criteria and insurance authorization.  Follow Up Recommendations Home health PT      Assistance Recommended at Discharge Frequent or constant Supervision/Assistance  Patient can return home with the following  A lot of help with walking and/or transfers;A lot of help with bathing/dressing/bathroom;Assistance with cooking/housework;Direct supervision/assist for medications management;Assist for transportation;Help with stairs or ramp for entrance;Direct supervision/assist for financial management    Equipment Recommendations Other (comment);Wheelchair (measurements PT);Wheelchair cushion (measurements PT) (family to check on condition of WC for pt to  borrow, may not need to order one); bedside commode  Recommendations for Other Services       Functional Status Assessment Patient has had a recent decline in their functional status and demonstrates the ability to make significant improvements in function in a reasonable and predictable amount of time.     Precautions / Restrictions Precautions Precautions: Fall Precaution Comments: most recent fall was in July 2* seizure per spouse; expressive aphasia Restrictions Weight Bearing Restrictions: No      Mobility  Bed Mobility Overal bed mobility: Needs Assistance Bed Mobility: Supine to Sit     Supine to sit: Max assist     General bed mobility comments: assist to raise trunk and advance RLE    Transfers Overall transfer level: Needs assistance Equipment used: 2 person hand held assist Transfers: Sit to/from Stand Sit to Stand: Max assist, +2 safety/equipment, +2 physical assistance           General transfer comment: assist to power up and to steady    Ambulation/Gait Ambulation/Gait assistance: Mod assist, Max assist, +2 physical assistance Gait Distance (Feet): 8 Feet Assistive device: 2 person hand held assist Gait Pattern/deviations: Step-to pattern, Decreased step length - right, Decreased step length - left Gait velocity: decr     General Gait Details: assist to weight shift, assist to advance RLE, spouse with HHA on L/PT supporting under R shoulder for balance  Stairs            Wheelchair Mobility    Modified Rankin (Stroke Patients Only)       Balance Overall balance assessment: History of Falls, Needs assistance Sitting-balance support: Feet supported, No upper extremity supported Sitting balance-Leahy Scale: Poor Sitting balance - Comments:  assist for R lateral lean,   Standing balance support: Bilateral upper extremity supported Standing balance-Leahy Scale: Poor                               Pertinent Vitals/Pain  Pain Assessment Breathing: normal Negative Vocalization: none Facial Expression: smiling or inexpressive Body Language: relaxed Consolability: no need to console PAINAD Score: 0    Home Living Family/patient expects to be discharged to:: Private residence Living Arrangements: Spouse/significant other Available Help at Discharge: Family;Available 24 hours/day Type of Home: House Home Access: Stairs to enter Entrance Stairs-Rails: None Entrance Stairs-Number of Steps: 2   Home Layout: One level Home Equipment: Shower seat - built in Additional Comments: family member stated they have a WC with leg rests that pt can borrow    Prior Function Prior Level of Function : Needs assist             Mobility Comments: walked without AD, most recent fall in July 2* seizure ADLs Comments: assist to don "toe up" brace on Right, pt could tie L shoe independently, supervision shower transfers, was able to feed herself with RUE     Hand Dominance        Extremity/Trunk Assessment   Upper Extremity Assessment Upper Extremity Assessment: Defer to OT evaluation;RUE deficits/detail RUE Deficits / Details: prior to ~4 days ago, pt was able to feed self with RUE and was doing outpt OT for RUE, RUE now flaccid, spouse reports no functional use of RUE in past 4-5 days    Lower Extremity Assessment Lower Extremity Assessment: RLE deficits/detail RLE Deficits / Details: difficult to assess strength/sensation 2* expressive aphasia and difficulty following commands, no functional use of RLE observed, pt required assist to advance RLE with ambulation    Cervical / Trunk Assessment Cervical / Trunk Assessment: Normal  Communication   Communication: Expressive difficulties  Cognition Arousal/Alertness: Awake/alert Behavior During Therapy: WFL for tasks assessed/performed Overall Cognitive Status: Difficult to assess                                 General Comments: can follow  commands with increased time and with manual/verbal cues, pt has expressive aphasia, pt reports pt's answers to yes/no questions are not reliable        General Comments      Exercises     Assessment/Plan    PT Assessment Patient needs continued PT services  PT Problem List Decreased mobility;Decreased strength;Decreased activity tolerance;Decreased balance       PT Treatment Interventions Gait training;Therapeutic exercise;Functional mobility training;Balance training;Therapeutic activities;Patient/family education    PT Goals (Current goals can be found in the Care Plan section)  Acute Rehab PT Goals Patient Stated Goal: care for horses PT Goal Formulation: With family Time For Goal Achievement: 12/12/21 Potential to Achieve Goals: Fair    Frequency Min 3X/week     Co-evaluation               AM-PAC PT "6 Clicks" Mobility  Outcome Measure Help needed turning from your back to your side while in a flat bed without using bedrails?: Total Help needed moving from lying on your back to sitting on the side of a flat bed without using bedrails?: Total Help needed moving to and from a bed to a chair (including a wheelchair)?: Total Help needed standing up from a chair using  your arms (e.g., wheelchair or bedside chair)?: A Lot Help needed to walk in hospital room?: A Lot Help needed climbing 3-5 steps with a railing? : Total 6 Click Score: 8    End of Session Equipment Utilized During Treatment: Gait belt Activity Tolerance: Patient limited by fatigue Patient left: in chair;with call bell/phone within reach;with family/visitor present Nurse Communication: Mobility status PT Visit Diagnosis: Unsteadiness on feet (R26.81);Difficulty in walking, not elsewhere classified (R26.2);Other abnormalities of gait and mobility (R26.89)    Time: 1329-1401 PT Time Calculation (min) (ACUTE ONLY): 32 min   Charges:   PT Evaluation $PT Eval Moderate Complexity: 1 Mod PT  Treatments $Therapeutic Activity: 8-22 mins        Blondell Reveal Kistler PT 11/28/2021  Acute Rehabilitation Services  Office [phone]

## 2021-11-29 ENCOUNTER — Inpatient Hospital Stay (HOSPITAL_COMMUNITY): Payer: 59

## 2021-11-29 DIAGNOSIS — A689 Relapsing fever, unspecified: Secondary | ICD-10-CM

## 2021-11-29 DIAGNOSIS — C719 Malignant neoplasm of brain, unspecified: Secondary | ICD-10-CM | POA: Diagnosis not present

## 2021-11-29 DIAGNOSIS — I10 Essential (primary) hypertension: Secondary | ICD-10-CM | POA: Diagnosis not present

## 2021-11-29 DIAGNOSIS — J189 Pneumonia, unspecified organism: Secondary | ICD-10-CM | POA: Diagnosis not present

## 2021-11-29 DIAGNOSIS — A419 Sepsis, unspecified organism: Secondary | ICD-10-CM | POA: Diagnosis not present

## 2021-11-29 LAB — COMPREHENSIVE METABOLIC PANEL
ALT: 24 U/L (ref 0–44)
AST: 44 U/L — ABNORMAL HIGH (ref 15–41)
Albumin: 2.3 g/dL — ABNORMAL LOW (ref 3.5–5.0)
Alkaline Phosphatase: 44 U/L (ref 38–126)
Anion gap: 7 (ref 5–15)
BUN: 15 mg/dL (ref 6–20)
CO2: 26 mmol/L (ref 22–32)
Calcium: 8.1 mg/dL — ABNORMAL LOW (ref 8.9–10.3)
Chloride: 108 mmol/L (ref 98–111)
Creatinine, Ser: 0.63 mg/dL (ref 0.44–1.00)
GFR, Estimated: >60 mL/min (ref >60)
Glucose, Bld: 114 mg/dL — ABNORMAL HIGH (ref 70–99)
Potassium: 3.8 mmol/L (ref 3.5–5.1)
Sodium: 141 mmol/L (ref 135–145)
Total Bilirubin: 0.5 mg/dL (ref 0.3–1.2)
Total Protein: 5.4 g/dL — ABNORMAL LOW (ref 6.5–8.1)

## 2021-11-29 LAB — CBC WITH DIFFERENTIAL/PLATELET
Abs Immature Granulocytes: 0.11 10*3/uL — ABNORMAL HIGH (ref 0.00–0.07)
Basophils Absolute: 0 10*3/uL (ref 0.0–0.1)
Basophils Relative: 1 %
Eosinophils Absolute: 0 10*3/uL (ref 0.0–0.5)
Eosinophils Relative: 0 %
HCT: 32.1 % — ABNORMAL LOW (ref 36.0–46.0)
Hemoglobin: 11 g/dL — ABNORMAL LOW (ref 12.0–15.0)
Immature Granulocytes: 2 %
Lymphocytes Relative: 6 %
Lymphs Abs: 0.3 10*3/uL — ABNORMAL LOW (ref 0.7–4.0)
MCH: 35.8 pg — ABNORMAL HIGH (ref 26.0–34.0)
MCHC: 34.3 g/dL (ref 30.0–36.0)
MCV: 104.6 fL — ABNORMAL HIGH (ref 80.0–100.0)
Monocytes Absolute: 0.2 10*3/uL (ref 0.1–1.0)
Monocytes Relative: 5 %
Neutro Abs: 4.2 10*3/uL (ref 1.7–7.7)
Neutrophils Relative %: 86 %
Platelets: 67 10*3/uL — ABNORMAL LOW (ref 150–400)
RBC: 3.07 MIL/uL — ABNORMAL LOW (ref 3.87–5.11)
RDW: 15.7 % — ABNORMAL HIGH (ref 11.5–15.5)
WBC: 4.9 10*3/uL (ref 4.0–10.5)
nRBC: 0 % (ref 0.0–0.2)

## 2021-11-29 LAB — MAGNESIUM: Magnesium: 1.9 mg/dL (ref 1.7–2.4)

## 2021-11-29 LAB — PHOSPHORUS: Phosphorus: 3.2 mg/dL (ref 2.5–4.6)

## 2021-11-29 MED ORDER — IOHEXOL 350 MG/ML SOLN
75.0000 mL | Freq: Once | INTRAVENOUS | Status: AC | PRN
  Administered 2021-11-29: 75 mL via INTRAVENOUS

## 2021-11-29 MED ORDER — FUROSEMIDE 10 MG/ML IJ SOLN
40.0000 mg | Freq: Once | INTRAMUSCULAR | Status: AC
  Administered 2021-11-29: 40 mg via INTRAVENOUS
  Filled 2021-11-29: qty 4

## 2021-11-29 NOTE — Evaluation (Signed)
Occupational Therapy Evaluation Patient Details Name: Kristen Foley MRN: [id number] DOB: 28-May-1965 Today's Date: 11/29/2021   History of Present Illness 56 year old female with a history of GBM, seizures, admitted to the hospital with altered mental status and high fever. Dx of sepsis, PNA.   Clinical Impression   Patient is currently requiring assistance with ADLs including up to total assist with Lower body ADLs including LE dressing and moderate-Max assist with toileting and LE bathing, up to moderate to maximum assist with seated Upper body ADLs,  as well as  minimal assist with bed mobility and minimal assist with functional transfers to toilet using RW.  Current level of function is below patient's typical baseline.  During this evaluation, patient was limited by RT UE hypotonia and hemiparesis, RT LE weakness, impaired activity tolerance, and baseline expressive aphasia with pt subsequently frustrated and tearful when attempting to state wants/needs, all of which has the potential to impact patient's safety and independence during functional mobility, as well as performance for ADLs.  Patient lives at home, with her family who are able to provide 24/7 supervision and assistance.  Patient demonstrates good rehab potential, and should benefit from continued skilled occupational therapy services while in acute care to maximize safety, independence and quality of life at home.  Continued occupational therapy services in the home with bridge to outpatient Pt and OT once pt mobilizing easier is recommended.  ?     Recommendations for follow up therapy are one component of a multi-disciplinary discharge planning process, led by the attending physician.  Recommendations may be updated based on patient status, additional functional criteria and insurance authorization.   Follow Up Recommendations  Home health OT    Assistance Recommended at Discharge Frequent or constant Supervision/Assistance   Patient can return home with the following A little help with walking and/or transfers;A little help with bathing/dressing/bathroom;Assistance with feeding;Help with stairs or ramp for entrance;Assist for transportation;Assistance with cooking/housework;Direct supervision/assist for medications management    Functional Status Assessment  Patient has had a recent decline in their functional status and demonstrates the ability to make significant improvements in function in a reasonable and predictable amount of time.  Equipment Recommendations  BSC/3in1 (2 wheeled RW with 5" wheels)    Recommendations for Other Services       Precautions / Restrictions Precautions Precautions: Fall Precaution Comments: most recent fall was in July 2* seizure per spouse; expressive aphasia Restrictions Weight Bearing Restrictions: No      Mobility Bed Mobility Overal bed mobility: Needs Assistance Bed Mobility: Supine to Sit     Supine to sit: Min assist, HOB elevated     General bed mobility comments: Use of rails and step by step cues for sequencing.    Transfers                          Balance Overall balance assessment: History of Falls, Needs assistance Sitting-balance support: Feet supported, No upper extremity supported Sitting balance-Leahy Scale: Fair     Standing balance support: Bilateral upper extremity supported Standing balance-Leahy Scale: Poor                             ADL either performed or assessed with clinical judgement   ADL Overall ADL's : Needs assistance/impaired   Eating/Feeding Details (indicate cue type and reason): SLP following. Eating not observed but anticipate at [place] As due to RUE deficits.  Grooming: Wash/dry face;Sitting;Set up Grooming Details (indicate cue type and reason): With LT hand Upper Body Bathing: Moderate assistance;Sitting   Lower Body Bathing: Moderate assistance;Sitting/lateral leans   Upper Body Dressing  : Maximal assistance;Sitting   Lower Body Dressing: Total assistance;Bed level   Toilet Transfer: Min guard;Minimal assistance;Rolling walker (2 wheels) Toilet Transfer Details (indicate cue type and reason): Pt stood from EOB 4 reps. 1st EOB to [place] with Min As. 2nd to [place] with Min guard. 3rd and 4th to RW with Min As. Pt required assist to place RT hand on RW then able to maintain light grip while pivoting to recliner, taking about 7 steps with cues for sequence and Min As.  Min As to safely descend to EOB and recliner. Toileting- Clothing Manipulation and Hygiene: Maximal assistance Toileting - Clothing Manipulation Details (indicate cue type and reason): On external catheter.     Functional mobility during ADLs: Min guard;Minimal assistance;Cueing for sequencing;Cueing for safety;Rolling walker (2 wheels)       Vision   Vision Assessment?: No apparent visual deficits Additional Comments: Not formally tested. Pt giving good visual attention to all tasks     Perception     Praxis      Pertinent Vitals/Pain Pain Assessment Pain Assessment: No/denies pain     Hand Dominance Right   Extremity/Trunk Assessment Upper Extremity Assessment Upper Extremity Assessment: LUE deficits/detail RUE Deficits / Details: prior to ~4 days ago, pt was able to feed self with RUE and was doing outpt OT for RUE, RUE now very hypotonic, family reports no functional use of RUE in past 5-6 days.  MMT: Shoulder 2-/5, elbow 2/5, wrist 2/5, hand 2+/5 flexion, 2/5 extension with quick onset muscle fatigue. RUE Sensation: WNL RUE Coordination: decreased fine motor;decreased gross motor LUE Deficits / Details: WFL LUE Sensation: WNL LUE Coordination: WNL       Cervical / Trunk Assessment Cervical / Trunk Assessment: Normal   Communication Communication Communication: Expressive difficulties   Cognition Arousal/Alertness: Awake/alert Behavior During Therapy: WFL for tasks  assessed/performed Overall Cognitive Status: Within Functional Limits for tasks assessed Area of Impairment: Problem solving, Following commands                       Following Commands: Follows multi-step commands inconsistently, Follows multi-step commands with increased time, Follows one step commands consistently, Follows one step commands with increased time     Problem Solving: Slow processing, Difficulty sequencing General Comments: Following commands at a good pace today, expresses frustration with expressive aphasia. Able to verbalize a few clear sentences. Tearful at times with frustration.     General Comments       Exercises Other Exercises Other Exercises: While supine, pt worked on [employer] exercises to RUE with OT palpating shoulder activation with eccentric UE movement, as well as good elbow range in gravity eliminated position, lacking ~20* flex and extension of active muscle contraction, but to a soft feel and able to complete range with PROM. Wrist worked from [employer] eliminated, progressing to against gravity for AROM wrist flexion but requires heavy AAROM for flexion. Grip with active FF to near composite grip and delayed FE but able to demonstrate full finger extension x 2 actievly with cues and increased time. Pt showed some active supination/pro as well.   Pt cued to given visual attention to all exercises and following well.   Shoulder Instructions      Home Living Family/patient expects to be discharged to:: Private residence  Home Layout: One level     Bathroom Shower/Tub: Walk-in shower;Tub/shower unit         Home Equipment: Shower seat - built in   Additional Comments: family member stated they have a WC with leg rests that pt can borrow      Prior Functioning/Environment Prior Level of Function : Needs assist             Mobility Comments: walked without AD, most recent fall in July 2* seizure ADLs Comments: assist to  don "toe up" brace on Right, pt could tie L shoe independently, supervision shower transfers, was able to feed herself with RUE        OT Problem List: Impaired UE functional use;Decreased range of motion;Decreased strength;Decreased coordination;Decreased activity tolerance;Impaired tone;Impaired balance (sitting and/or standing)      OT Treatment/Interventions: Self-care/ADL training;Therapeutic exercise;Therapeutic activities;Cognitive remediation/compensation;Neuromuscular education;Energy conservation;Patient/family education;DME and/or AE instruction;Balance training;Manual therapy    OT Goals(Current goals can be found in the care plan section) Acute Rehab OT Goals Patient Stated Goal: "I want to walk" OT Goal Formulation: With patient Time For Goal Achievement: 12/13/21 Potential to Achieve Goals: Good ADL Goals Pt Will Perform Upper Body Dressing: with set-up;with supervision;sitting Pt Will Perform Lower Body Dressing: sitting/lateral leans;sit to/from stand;with supervision;with set-up Pt Will Transfer to Toilet: with supervision;ambulating;regular height toilet Pt Will Perform Toileting - Clothing Manipulation and hygiene: with supervision;sitting/lateral leans;sit to/from stand Additional ADL Goal #1: Pt will demosnstrate RUE HEP with LUE assisting only as needed in order to increase RUE strenagth for use in ADLs and functional mobility. Additional ADL Goal #2: Pt will engage in at least 20 min functional activities without loss of sitting or standing balance, in order to demonstrate improved activity tolerance and balance needed to perform ADLs safely at home.  OT Frequency: Min 3X/week    Co-evaluation              AM-PAC OT "6 Clicks" Daily Activity     Outcome Measure Help from another person eating meals?: A Little Help from another person taking care of personal grooming?: A Little Help from another person toileting, which includes using toliet, bedpan, or  urinal?: A Lot Help from another person bathing (including washing, rinsing, drying)?: A Lot Help from another person to put on and taking off regular upper body clothing?: A Lot Help from another person to put on and taking off regular lower body clothing?: Total 6 Click Score: 13   End of Session Equipment Utilized During Treatment: Gait belt;Rolling walker (2 wheels) Nurse Communication: Mobility status  Activity Tolerance: Patient tolerated treatment well Patient left: in chair;with call bell/phone within reach;with family/visitor present  OT Visit Diagnosis: Hemiplegia and hemiparesis;Cognitive communication deficit (R41.841) Symptoms and signs involving cognitive functions:  (GBM) Hemiplegia - Right/Left: Right Hemiplegia - dominant/non-dominant: Dominant Hemiplegia - caused by:  (GBM)                Time: 3419-6222 OT Time Calculation (min): 41 min Charges:  OT General Charges $OT Visit: 1 Visit OT Evaluation $OT Eval Low Complexity: 1 Low OT Treatments $Therapeutic Activity: 23-37 mins  Anderson Malta, OT Acute Rehab Services Office: [phone] 11/29/2021  Julien Girt 11/29/2021, 10:47 AM

## 2021-11-29 NOTE — Consult Note (Addendum)
   [facility] Inpatient Consult   11/29/2021  DEAZIA LAMPI Oct 21, 1965 [id number]   [facility] [facility] [ACO] Patient:  Kristen Foley   Addendum:  [facility] Liaison coverage for [facility], Remote review coverage   Primary Care Provider: Unk Pinto, MD, [facility], is an [place] provider with a Care Management team and program and is listed for the [facility] follow up needs     Patient screened for less than 7 days readmission.  Review of patient's medical record reveals patient is active with Oncology/Cancer teams for follow up. Reviewed MD progress notes, Inpatient TOC team notes, PT/OT, Vynca for potential post hospital follow up needs.     Plan:  Continue to follow progress and disposition to assess for post hospital care management needs for transition.     For questions contact:    Natividad Brood, RN BSN [facility] Liaison  [phone] business mobile phone Toll free office [phone]  Fax number: [phone] [email or web address]
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
## 2021-11-29 NOTE — Progress Notes (Signed)
SATURATION QUALIFICATIONS: (This note is used to comply with regulatory documentation for home oxygen)  Patient Saturations on Room Air at Rest = 100%  Patient Saturations on Room Air while Sitting up= 94%  Please briefly explain why patient needs home oxygen: Pt sating well on room air.

## 2021-11-30 ENCOUNTER — Inpatient Hospital Stay (HOSPITAL_COMMUNITY): Payer: 59

## 2021-11-30 DIAGNOSIS — A419 Sepsis, unspecified organism: Secondary | ICD-10-CM | POA: Diagnosis not present

## 2021-11-30 DIAGNOSIS — I10 Essential (primary) hypertension: Secondary | ICD-10-CM | POA: Diagnosis not present

## 2021-11-30 DIAGNOSIS — C719 Malignant neoplasm of brain, unspecified: Secondary | ICD-10-CM | POA: Diagnosis not present

## 2021-11-30 DIAGNOSIS — J189 Pneumonia, unspecified organism: Secondary | ICD-10-CM | POA: Diagnosis not present

## 2021-11-30 LAB — COMPREHENSIVE METABOLIC PANEL
ALT: 48 U/L — ABNORMAL HIGH (ref 0–44)
AST: 58 U/L — ABNORMAL HIGH (ref 15–41)
Albumin: 2.2 g/dL — ABNORMAL LOW (ref 3.5–5.0)
Alkaline Phosphatase: 44 U/L (ref 38–126)
Anion gap: 6 (ref 5–15)
BUN: 18 mg/dL (ref 6–20)
CO2: 26 mmol/L (ref 22–32)
Calcium: 7.8 mg/dL — ABNORMAL LOW (ref 8.9–10.3)
Chloride: 109 mmol/L (ref 98–111)
Creatinine, Ser: 0.69 mg/dL (ref 0.44–1.00)
GFR, Estimated: >60 mL/min (ref >60)
Glucose, Bld: 117 mg/dL — ABNORMAL HIGH (ref 70–99)
Potassium: 4.5 mmol/L (ref 3.5–5.1)
Sodium: 141 mmol/L (ref 135–145)
Total Bilirubin: 0.5 mg/dL (ref 0.3–1.2)
Total Protein: 4.7 g/dL — ABNORMAL LOW (ref 6.5–8.1)

## 2021-11-30 LAB — CBC WITH DIFFERENTIAL/PLATELET
Abs Immature Granulocytes: 0.03 10*3/uL (ref 0.00–0.07)
Basophils Absolute: 0 10*3/uL (ref 0.0–0.1)
Basophils Relative: 0 %
Eosinophils Absolute: 0 10*3/uL (ref 0.0–0.5)
Eosinophils Relative: 0 %
HCT: 27.9 % — ABNORMAL LOW (ref 36.0–46.0)
Hemoglobin: 9.1 g/dL — ABNORMAL LOW (ref 12.0–15.0)
Immature Granulocytes: 1 %
Lymphocytes Relative: 3 %
Lymphs Abs: 0.1 10*3/uL — ABNORMAL LOW (ref 0.7–4.0)
MCH: 35.5 pg — ABNORMAL HIGH (ref 26.0–34.0)
MCHC: 32.6 g/dL (ref 30.0–36.0)
MCV: 109 fL — ABNORMAL HIGH (ref 80.0–100.0)
Monocytes Absolute: 0.2 10*3/uL (ref 0.1–1.0)
Monocytes Relative: 3 %
Neutro Abs: 4.9 10*3/uL (ref 1.7–7.7)
Neutrophils Relative %: 93 %
Platelets: 52 10*3/uL — ABNORMAL LOW (ref 150–400)
RBC: 2.56 MIL/uL — ABNORMAL LOW (ref 3.87–5.11)
RDW: 15.9 % — ABNORMAL HIGH (ref 11.5–15.5)
WBC: 5.2 10*3/uL (ref 4.0–10.5)
nRBC: 0 % (ref 0.0–0.2)

## 2021-11-30 LAB — CULTURE, BLOOD (ROUTINE X 2): Culture: NO GROWTH

## 2021-11-30 LAB — MAGNESIUM: Magnesium: 2.1 mg/dL (ref 1.7–2.4)

## 2021-11-30 LAB — PHOSPHORUS: Phosphorus: 3.2 mg/dL (ref 2.5–4.6)

## 2021-11-30 MED ORDER — DIPHENHYDRAMINE HCL 25 MG PO CAPS
25.0000 mg | ORAL_CAPSULE | Freq: Four times a day (QID) | ORAL | Status: DC | PRN
Start: 2021-11-30 — End: 2021-12-03
  Administered 2021-11-30: 25 mg via ORAL
  Filled 2021-11-30 (×2): qty 1

## 2021-11-30 NOTE — TOC Initial Note (Signed)
Transition of Care [facility]) - Initial/Assessment Note    Patient Details  Name: Kristen Foley MRN: [id number] Date of Birth: 14-May-1965  Transition of Care [facility]) CM/SW Contact:    Kristen Pall, LCSW Phone Number: 11/30/2021, 1:03 PM  Clinical Narrative:                 Met with pt and spouse today to introduce CSW/TOC role with dc planning.  Pt/spouse had just spoken with therapists about their recommendation for CIR and he is considering this option.   He notes that he and patient are eager to get home, however, aware that this could be very good option to regain overall strength and mobility.  At this point, they are in agreement with CIR plan if they meet CIR guidelines and insurance agrees to authorize.  He notes they will need some DME when returning home but understands that TOC will hold off on ordering at this time as needs may change.  TOC will continue to monitor plans/ needs.  Expected Discharge Plan: [place] (vs. home with home health) Barriers to Discharge: Continued Medical Work up   Patient Goals and CMS Choice Patient states their goals for this hospitalization and ongoing recovery are:: return home      Expected Discharge Plan and Services Expected Discharge Plan: [place] (vs. home with home health)     Post Acute Care Choice: IP Rehab Living arrangements for the past 2 months: Single Family Home                                      Prior Living Arrangements/Services Living arrangements for the past 2 months: Single Family Home Lives with:: Spouse   Do you feel safe going back to the place where you live?: Yes      Need for Family Participation in Patient Care: Yes (Comment) Care giver support system in place?: Yes (comment)   Criminal Activity/Legal Involvement Pertinent to Current Situation/Hospitalization: No - Comment as needed  Activities of Daily Living Home Assistive Devices/Equipment: None ADL Screening (condition at time  of admission) Patient's cognitive ability adequate to safely complete daily activities?: Yes Is the patient deaf or have difficulty hearing?: No Does the patient have difficulty seeing, even when wearing glasses/contacts?: No Does the patient have difficulty concentrating, remembering, or making decisions?: Yes Patient able to express need for assistance with ADLs?: Yes Does the patient have difficulty dressing or bathing?: Yes Independently performs ADLs?: Yes (appropriate for developmental age) Does the patient have difficulty walking or climbing stairs?: Yes Weakness of Legs: Right Weakness of Arms/Hands: Right  Permission Sought/Granted Permission sought to share information with : Family Supports Permission granted to share information with : Yes, Verbal Permission Granted  Share Information with NAME: Kristen Foley     Permission granted to share info w Relationship: spouse  Permission granted to share info w Contact Information: [phone]  Emotional Assessment Appearance:: Appears stated age   Affect (typically observed): Flat Orientation: : Oriented to Self Alcohol / Substance Use: Not Applicable Psych Involvement: No (comment)  Admission diagnosis:  Sepsis ([place]) [A41.9] Community acquired pneumonia of left upper lobe of lung [J18.9] Patient Active Problem List   Diagnosis Date Noted   Sepsis ([place]) 11/25/2021   CAP (community acquired pneumonia) 11/25/2021   Essential hypertension 11/25/2021   GBM (glioblastoma multiforme) ([place]) 11/23/2021   Seizure ([place]) 11/23/2021   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 [place] [address]Phone: [phone]: [phone] [facility]Phone: [phone]: [phone]    Social Determinants of Health (SDOH) Interventions    Readmission Risk Interventions    11/30/2021   12:57 PM  Readmission Risk Prevention Plan  Post Dischage Appt Complete  Medication Screening Complete  Transportation Screening Complete

## 2021-11-30 NOTE — Progress Notes (Signed)
Physical Therapy Treatment Patient Details Name: Kristen Foley MRN: [id number] DOB: 27-Sep-1965 Today's Date: 11/30/2021   History of Present Illness 56 year old female with a history of glioblastoma, R hemiparesis, expressive aphasia, seizures, admitted to the hospital with altered mental status and high fever. Dx of sepsis, PNA.    PT Comments    Improved motor function noted in RUE/RLE. Pt now has weak grip and elbow extensor strength on RUE, which were both flaccid last session. Pt able to actively assist with R heel slide, no active dorsiflexion noted R ankle. Pt's spouse reports pt has a foot drop brace at home that pt was wearing prior to admission, I encouraged him to bring that in. Significant improvement in mobility today, pt ambulated 30' with min/mod assist for balance and management of RW, and to maintain R hand on RW. Pt ambulates with a narrow base of support and decreased step length on R. She continues to exhibit expressive aphasia with inconsistent answers to yes/no questions. She puts forth good effort. Husband present and engaged with pt's care. Pt has been on an external urinary catheter, I encouraged pt/spouse to DC use of this and to call for assistance to get up to a bedside commode or walk to the bathroom. PT/OT now recommending acute inpatient rehab.    Recommendations for follow up therapy are one component of a multi-disciplinary discharge planning process, led by the attending physician.  Recommendations may be updated based on patient status, additional functional criteria and insurance authorization.  Follow Up Recommendations  Acute inpatient rehab (3hours/day)     Assistance Recommended at Discharge Frequent or constant Supervision/Assistance  Patient can return home with the following A lot of help with walking and/or transfers;A lot of help with bathing/dressing/bathroom;Assistance with cooking/housework;Direct supervision/assist for medications management;Assist  for transportation;Help with stairs or ramp for entrance;Direct supervision/assist for financial management   Equipment Recommendations  Wheelchair (measurements PT);Wheelchair cushion (measurements PT)    Recommendations for Other Services       Precautions / Restrictions Precautions Precautions: Fall Precaution Comments: most recent fall was in July 2* seizure per spouse; expressive aphasia Restrictions Weight Bearing Restrictions: No     Mobility  Bed Mobility Overal bed mobility: Needs Assistance Bed Mobility: Supine to Sit     Supine to sit: Min assist, HOB elevated     General bed mobility comments: Use of rails and step by step cues for sequencing, assist to raise trunk    Transfers Overall transfer level: Needs assistance Equipment used: Rolling walker (2 wheels) Transfers: Sit to/from Stand Sit to Stand: Mod assist           General transfer comment: assist to power up and to steady, assist for placement of RUE on RW    Ambulation/Gait Ambulation/Gait assistance: Min assist, Mod assist Gait Distance (Feet): 30 Feet Assistive device: Rolling walker (2 wheels) Gait Pattern/deviations: Step-to pattern, Decreased step length - right, Decreased step length - left, Narrow base of support Gait velocity: decr     General Gait Details: no assist needed to advance RLE today, pt does have some grip and triceps strength today (which she did not have last session) so was able to use RW but required min A to maintain R hand on RW. VCs to widen base of support. Min/mod assist for balance. Distance limited by fatigue. Head forward flexed. VCs for sequencing and to increase step length.   Stairs             Emergency planning/management officer  Modified Rankin (Stroke Patients Only)       Balance Overall balance assessment: History of Falls, Needs assistance Sitting-balance support: Feet supported, No upper extremity supported Sitting balance-Leahy Scale: Fair      Standing balance support: Bilateral upper extremity supported Standing balance-Leahy Scale: Poor                              Cognition Arousal/Alertness: Awake/alert Behavior During Therapy: WFL for tasks assessed/performed Overall Cognitive Status: Impaired/Different from baseline Area of Impairment: Problem solving, Following commands                       Following Commands: Follows multi-step commands inconsistently, Follows multi-step commands with increased time, Follows one step commands consistently, Follows one step commands with increased time     Problem Solving: Slow processing, Difficulty sequencing, Requires verbal cues, Requires tactile cues General Comments: verbal and manual cues to follow 1 step commands; pt nods head for yes/no and attempts to verbalize answers to questions but has difficulty getting words out, her verbal response is often inconsistent with head nods        Exercises General Exercises - Lower Extremity Ankle Circles/Pumps: Right, 10 reps, Supine, AAROM Heel Slides: AAROM, Right, 10 reps, Supine    General Comments        Pertinent Vitals/Pain Pain Assessment Pain Assessment: No/denies pain    Home Living                          Prior Function            PT Goals (current goals can now be found in the care plan section) Acute Rehab PT Goals Patient Stated Goal: care for horses PT Goal Formulation: With family Time For Goal Achievement: 12/12/21 Potential to Achieve Goals: Fair Progress towards PT goals: Progressing toward goals    Frequency    Min 3X/week      PT Plan Discharge plan needs to be updated    Co-evaluation              AM-PAC PT "6 Clicks" Mobility   Outcome Measure  Help needed turning from your back to your side while in a flat bed without using bedrails?: A Lot Help needed moving from lying on your back to sitting on the side of a flat bed without using bedrails?:  A Lot Help needed moving to and from a bed to a chair (including a wheelchair)?: A Lot Help needed standing up from a chair using your arms (e.g., wheelchair or bedside chair)?: A Lot Help needed to walk in hospital room?: A Lot Help needed climbing 3-5 steps with a railing? : A Lot 6 Click Score: 12    End of Session Equipment Utilized During Treatment: Gait belt Activity Tolerance: Patient limited by fatigue;No increased pain Patient left: in chair;with call bell/phone within reach;with family/visitor present Nurse Communication: Mobility status PT Visit Diagnosis: Unsteadiness on feet (R26.81);Difficulty in walking, not elsewhere classified (R26.2);Other abnormalities of gait and mobility (R26.89)     Time: 1005-1036 PT Time Calculation (min) (ACUTE ONLY): 31 min  Charges:  $Gait Training: 8-22 mins $Therapeutic Activity: 8-22 mins                     Blondell Reveal Kistler PT 11/30/2021  Acute Rehabilitation Services  Office [phone]

## 2021-11-30 NOTE — Progress Notes (Signed)
Physical Therapy Treatment Patient Details Name: Kristen Foley MRN: [id number] DOB: October 30, 1965 Today's Date: 11/30/2021   History of Present Illness 56 year old female with a history of glioblastoma, R hemiparesis, expressive aphasia, seizures, admitted to the hospital with altered mental status and high fever. Dx of sepsis, PNA.    PT Comments    Assisted pt to transfer from recliner to bedside commode, then back to recliner with RW and min assist.    Recommendations for follow up therapy are one component of a multi-disciplinary discharge planning process, led by the attending physician.  Recommendations may be updated based on patient status, additional functional criteria and insurance authorization.  Follow Up Recommendations  Acute inpatient rehab (3hours/day)     Assistance Recommended at Discharge Frequent or constant Supervision/Assistance  Patient can return home with the following A lot of help with walking and/or transfers;A lot of help with bathing/dressing/bathroom;Assistance with cooking/housework;Direct supervision/assist for medications management;Assist for transportation;Help with stairs or ramp for entrance;Direct supervision/assist for financial management   Equipment Recommendations  Wheelchair (measurements PT);Wheelchair cushion (measurements PT)    Recommendations for Other Services       Precautions / Restrictions Precautions Precautions: Fall Precaution Comments: most recent fall was in July 2* seizure per spouse; expressive aphasia, R HP Restrictions Weight Bearing Restrictions: No     Mobility  Bed Mobility Overal bed mobility: Needs Assistance Bed Mobility: Supine to Sit     Supine to sit: Min assist, HOB elevated     General bed mobility comments: Use of rails and step by step cues for sequencing, assist to raise trunk    Transfers Overall transfer level: Needs assistance Equipment used: Rolling walker (2 wheels) Transfers: Sit to/from  Stand, Bed to chair/wheelchair/BSC Sit to Stand: Min assist   Step pivot transfers: Min assist       General transfer comment: assist to power up and to steady, assist for placement of RUE on RW. Pt took a few pivotal steps from recliner to bedside commode, then back to recliner.      Stairs             Wheelchair Mobility    Modified Rankin (Stroke Patients Only)       Balance Overall balance assessment: History of Falls, Needs assistance Sitting-balance support: Feet supported, No upper extremity supported Sitting balance-Leahy Scale: Fair     Standing balance support: Bilateral upper extremity supported Standing balance-Leahy Scale: Poor                              Cognition Arousal/Alertness: Awake/alert Behavior During Therapy: WFL for tasks assessed/performed Overall Cognitive Status: Impaired/Different from baseline Area of Impairment: Problem solving, Following commands                       Following Commands: Follows multi-step commands inconsistently, Follows multi-step commands with increased time, Follows one step commands consistently, Follows one step commands with increased time     Problem Solving: Slow processing, Difficulty sequencing, Requires verbal cues, Requires tactile cues General Comments: verbal and manual cues to follow 1 step commands; pt nods head for yes/no and attempts to verbalize answers to questions but has difficulty getting words out, her verbal response is often inconsistent with head nods        Exercises General Exercises - Lower Extremity Ankle Circles/Pumps: Right, 10 reps, Supine, AAROM Heel Slides: AAROM, Right, 10 reps, Supine    General  Comments        Pertinent Vitals/Pain Pain Assessment Pain Assessment: No/denies pain Pain Score: 0-No pain    Home Living                          Prior Function            PT Goals (current goals can now be found in the care plan  section) Acute Rehab PT Goals Patient Stated Goal: care for horses PT Goal Formulation: With family Time For Goal Achievement: 12/12/21 Potential to Achieve Goals: Fair Progress towards PT goals: Progressing toward goals    Frequency    Min 3X/week      PT Plan Discharge plan needs to be updated    Co-evaluation              AM-PAC PT "6 Clicks" Mobility   Outcome Measure  Help needed turning from your back to your side while in a flat bed without using bedrails?: A Lot Help needed moving from lying on your back to sitting on the side of a flat bed without using bedrails?: A Lot Help needed moving to and from a bed to a chair (including a wheelchair)?: A Little Help needed standing up from a chair using your arms (e.g., wheelchair or bedside chair)?: A Little Help needed to walk in hospital room?: A Lot Help needed climbing 3-5 steps with a railing? : A Lot 6 Click Score: 14    End of Session Equipment Utilized During Treatment: Gait belt Activity Tolerance: No increased pain Patient left: in chair;with call bell/phone within reach;with family/visitor present Nurse Communication: Mobility status PT Visit Diagnosis: Unsteadiness on feet (R26.81);Difficulty in walking, not elsewhere classified (R26.2);Other abnormalities of gait and mobility (R26.89)     Time: 8676-1950 PT Time Calculation (min) (ACUTE ONLY): 9 min  Charges:   $Therapeutic Activity: 8-22 mins                    Blondell Reveal Kistler PT 11/30/2021  Acute Rehabilitation Services  Office [phone]

## 2021-11-30 NOTE — Progress Notes (Signed)
Occupational Therapy Treatment Patient Details Name: Kristen Foley MRN: [id number] DOB: Jan 07, 1966 Today's Date: 11/30/2021   History of present illness 56 year old female with a history of glioblastoma, R hemiparesis, expressive aphasia, seizures, admitted to the hospital with altered mental status and high fever. Dx of sepsis, PNA.   OT comments  Treatment focused on use of and activation of muscles in right dominant upper extremity. Patient supine in bed and min assist to transfer to edge of bed. Patient holding and moving RUE with left. To encourage use and movement of arm therapist had patient push IV pole forward and back sitting on edge of bed. Therapist helped hold patient's grasp on pole, supporting arm and activating forward flexion and tricep extension. Patient actively able to extend shoulder and pull IV pole back and have a light grasp on pole - with minimal ability to push forward. Returned patient to show patient how to range shoulder with active assist on her own. Worked on elbow extension, flexion, and wrist extension in supine as well with some light muscle tapping. Patient exhibited more ROM today in elbow and wrist but significantly limited by abnormal muscle recruitment - especially shoulder internal rotation and shoulder extension. Also has difficulty relaxing and turning muscles off.  Patient has 24/7 family support and is highly motivated to work with therapy and return to her baseline. Patient is a great candidate for aggressive short term rehab prior to return home. Recommend AIR level of therapy.   Recommendations for follow up therapy are one component of a multi-disciplinary discharge planning process, led by the attending physician.  Recommendations may be updated based on patient status, additional functional criteria and insurance authorization.    Follow Up Recommendations  Acute inpatient rehab (3hours/day)    Assistance Recommended at Discharge Frequent or constant  Supervision/Assistance  Patient can return home with the following  A little help with walking and/or transfers;Assistance with feeding;Help with stairs or ramp for entrance;Assist for transportation;Assistance with cooking/housework;Direct supervision/assist for medications management;A lot of help with bathing/dressing/bathroom   Equipment Recommendations  BSC/3in1    Recommendations for Other Services Rehab consult    Precautions / Restrictions Precautions Precautions: Fall Precaution Comments: most recent fall was in July 2* seizure per spouse; expressive aphasia Restrictions Weight Bearing Restrictions: No       Mobility Bed Mobility Overal bed mobility: Needs Assistance Bed Mobility: Supine to Sit     Supine to sit: Min assist, HOB elevated     General bed mobility comments: Min assist to transfer to edge of bed. increased time. assistance for positioning of UE and assistance with RLE.    Transfers                         Balance Overall balance assessment: Needs assistance Sitting-balance support: No upper extremity supported, Feet supported Sitting balance-Leahy Scale: Fair Sitting balance - Comments: fair for sitting edge of bed.                                     Extremity/Trunk Assessment Upper Extremity Assessment RUE Deficits / Details: Shoulder 2-/5, elbow 3-/5, wrist 3-/5, fingers 3-/5. UE hypotonic at rest RUE Sensation: WNL RUE Coordination: decreased fine motor;decreased gross motor LUE Deficits / Details: WFL LUE Sensation: WNL LUE Coordination: WNL   Lower Extremity Assessment Lower Extremity Assessment: Defer to PT evaluation   Cervical / Trunk Assessment  Cervical / Trunk Assessment: Normal    Vision   Vision Assessment?: No apparent visual deficits   Perception     Praxis      Cognition Arousal/Alertness: Awake/alert Behavior During Therapy: WFL for tasks assessed/performed Overall Cognitive Status:  Within Functional Limits for tasks assessed                                 General Comments: Communication impaired secondary to aphasia but able to follow commands with verbal and tactile cues.        Exercises Other Exercises Other Exercises: In supine: Educated patinet how to passively range RUE to 90 degrees with active assist. Other Exercises: In supine: worked on activating and moving through near full ROM elbow flexion, elbow extension with shoulder positioned in 90 degrees flexion and with arm down at side. Worked wrist extension. Other Exercises: In sitting: attempted to use right hand to move wash cloth on thigh back and forth without success. Had patient push IV pole forward and back - with therapist providing active assist and maintaining positioning - to begin to activitate motor movement in RUE    Shoulder Instructions       General Comments      Pertinent Vitals/ Pain       Pain Assessment Pain Assessment: Faces Faces Pain Scale: Hurts a little bit Pain Location: R shoulder Pain Descriptors / Indicators: Aching, Grimacing Pain Intervention(s): Limited activity within patient's tolerance   Frequency  Min 3X/week        Progress Toward Goals  OT Goals(current goals can now be found in the care plan section)  Progress towards OT goals: Progressing toward goals  Acute Rehab OT Goals Patient Stated Goal: Improve use of right arm OT Goal Formulation: With patient Time For Goal Achievement: 12/13/21 Potential to Achieve Goals: Good  Plan Discharge plan needs to be updated    Co-evaluation                 AM-PAC OT "6 Clicks" Daily Activity     Outcome Measure   Help from another person eating meals?: A Little Help from another person taking care of personal grooming?: A Little Help from another person toileting, which includes using toliet, bedpan, or urinal?: A Lot Help from another person bathing (including washing, rinsing,  drying)?: A Lot Help from another person to put on and taking off regular upper body clothing?: A Lot Help from another person to put on and taking off regular lower body clothing?: A Lot 6 Click Score: 14    End of Session    OT Visit Diagnosis: Hemiplegia and hemiparesis;Cognitive communication deficit (R41.841) Symptoms and signs involving cognitive functions: Other cerebrovascular disease Hemiplegia - Right/Left: Right Hemiplegia - dominant/non-dominant: Dominant Hemiplegia - caused by: Other cerebrovascular disease   Activity Tolerance Patient tolerated treatment well   Patient Left in bed;with call bell/phone within reach;with family/visitor present   Nurse Communication Mobility status        Time: 0177-9390 OT Time Calculation (min): 36 min  Charges: OT General Charges $OT Visit: 1 Visit OT Treatments $Neuromuscular Re-education: 23-37 mins  Neri Vieyra, OTR/L [place]  Office [phone] Pager: [place] 11/30/2021, 11:36 AM

## 2021-11-30 NOTE — Progress Notes (Signed)
PROGRESS NOTE    Kristen Foley  XFG:[id number] DOB: [birth year] DOA: 11/25/2021 PCP: Unk Pinto, MD   Brief Narrative:  The patient is a 56 year old Caucasian female with a history of GBM, seizures, admitted to the hospital with altered mental status and high fever.  Chest imaging indicated possible underlying pneumonia. Started on broad-spectrum antibiotics.  Sepsis work-up in process and she is improving however she spiked another temperature again today.  Repeat chest x-ray done and showed "Bibasilar pulmonary opacities, raising concern for aspiration or multifocal infection."  Speech therapy evaluated and did an MBS which recommends a regular solid and thin liquid diet for the patient.   Chest x-ray today showed some increasing interstitial edema and opacities in the mid to lower lung fields and also showed increasing small left greater than right pleural effusions.  She is given a dose of IV Lasix 40 mg because of this.  Her chest x-ray also showed a 1.5 cm cavitating nodule in the peripheral left midlung on the recent CT scan.  Given that she continues to spike temperatures daily we will need to evaluate for PE and a CTA of the PE protocol was ordered.  CT of the chest done and showed "Interval development of small bilateral pleural effusions, diffuse ground-glass opacity, and smooth interlobular  septal thickening in keeping with mild pulmonary edema. Interval decrease in wall  thickness and solidity of a cavitary nodule within the left upper lobe suggesting an infectious or inflammatory process. Short-term follow-up imaging  in 3 months is recommended to document resolution. Stable right pulmonary nodules since remote prior examination of 06/02/2019"  She is improving and PT OT now recommending CIR   Assessment and Plan:  Sepsis with acute respiratory failure with hypoxia -Secondary to possible pneumonia -Blood cultures have not shown any growth to date -Urinalysis without signs  of infection and showed small hemoglobin and urine culture negative -On admission, she was tachycardic, leukopenic and had altered mental status and this is improving -Continue antibiotics but de-escalated the broad-spectrum antibiotics to azithromycin and ceftriaxone and will continue for now -Overall fevers appear to be improving and has not spiked a recurrent temperature again and her Tmax this afternoon was 98.7 -Given her recurrent fevers and slight sinus tachycardia as well as her recent respiratory failure will need to evaluate for PE so we will order a CTA chest PE protocol given her hypercoagulable state in the setting of cancer -See CT of the chest as below -Repeat chest x-ray done and showed "Increasing interstitial edema and opacities in the mid to lower lung fields. Increasing small left-greater-than-right pleural effusions.1.5 cm cavitating nodule in the peripheral left mid lung on the recent CT is not well seen today." -Given the vascular congestion and pleural effusion noted she is given IV Lasix 40 mg and improved respiratory status and off of supplemental oxygen altogether now -Continuous pulse oximetry maintain O2 saturation greater than 90% -Strep pneumo urinary antigen and Legionella Serogp 1 Ur Ag is Negative  -Continue supplemental oxygen via nasal cannula wean O2 to room air and she is finally weaned to room air -She will need an ambulatory home O2 screen prior to discharge and repeat chest x-ray in a.m. -PT OT recommending CIR now and if she does not discharged to inpatient rehab she will need to go home with home health with a wheelchair wheelchair cushion   Cavitary lung nodule -Chest x-ray yesterday showed a 1.5 cm cavitating nodule in the peripheral left midlung -Getting a CT scan  of the chest PE protocol with contrast to further evaluate given her fevers -CT of the chest done and showed "Interval development of small bilateral pleural effusions, diffuse ground-glass  opacity, and smooth interlobular  septal thickening in keeping with mild pulmonary edema. Interval decrease in wall  thickness and solidity of a cavitary nodule within the left upper lobe suggesting an infectious or inflammatory process. Short-term follow-up imaging  in 3 months is recommended to document resolution. Stable right pulmonary nodules since remote prior examination of 06/02/2019" -We will have referral to pulmonary in outpatient setting   Acute metabolic encephalopathy, improving -Secondary to sepsis -Family notes that she was less responsive at home -CT head on admission unremarkable -Overall appears to be improving and husband thinks that she is closer to her baseline -Continue to monitor   Seizure disorder -Related to underlying brain mass -Continue on home seizure medicines including Keppra, Vimpat, as needed benzos -Continue seizure precautions   GBM -Followed at [facility] -Continued on chemotherapy -Continue on baseline dexamethasone dose -Recent CT Scan done and showed "Stable left frontal lobe mass consistent with glioma with surrounding edema and/or infiltrative tumor with mild regional mass effect but no midline shift. No new acute intracranial pathology."   Thrombocytopenia -Suspect is related to sepsis and history of cancer -Continue to monitor and Platelet count has gone from 82 -> 74 -> 65 -> 67 -> 52 -Discontinue prophylactic dose of Lovenox and use SCDs   Hypertension -Continue lisinopril and amlodipine -Continue monitor blood pressures per protocol -Last blood pressure reading was 144/78   Hypoalbuminemia -Patient's albumin level has gone from 3.2 -> 2.2 -> 1.9 -> 2.0 today is 2.3 and is now 2.2 -Continue to monitor and trend and repeat CMP in a.m.   Generalized weakness -PT/OT reevaluated and now recommending inpatient rehabilitation -CIR consulted for further evaluation and if she does not go to inpatient rehab she will go home with home  health   Pancytopenia -In the setting of her cancer and chemotherapy -Patient's WBC was 3.5 and hemoglobin/hematocrit was 9.5/20.7 with a platelet count of 65 -Now patient's WBC is 5.2, hemoglobin/hematocrit 9.1/27.9 and MCV of 109.0, and a platelet count of 52 -Continue monitor and trend and repeat CBC in a.m.   Abnormal LFTs -Mild and likely reactive -AST went from 26 -> 25 -> 44 -> 58 -ALT went from 19 -> 16 -> 24 -> 48 -Continue to monitor and trend and repeat CMP in a.m. and if necessary will obtain a right upper quadrant ultrasound as well as an acute hepatitis panel   DVT prophylaxis: Place and maintain sequential compression device Start: 11/27/21 1459    Code Status: Full Code Family Communication: Discussed with husband at bedside  Disposition Plan:  Level of care: Telemetry Status is: Inpatient Remains inpatient appropriate because: He is clinically improving and now may benefit from inpatient rehab so evaluation and insurance authorization is pending   Consultants:  CIR  Procedures:  As above  Antimicrobials:  Anti-infectives (From admission, onward)    Start     Dose/Rate Route Frequency Ordered Stop   11/26/21 1600  azithromycin (ZITHROMAX) tablet 500 mg        500 mg Oral Every 24 hours 11/26/21 1429 11/29/21 1654   11/26/21 1300  vancomycin (VANCOREADY) IVPB 750 mg/150 mL  Status:  Discontinued        750 mg 150 mL/hr over 60 Minutes Intravenous Every 24 hours 11/25/21 1128 11/25/21 1527   11/25/21 2200  ceFEPIme (MAXIPIME)  2 g in sodium chloride 0.9 % 100 mL IVPB  Status:  Discontinued        2 g 200 mL/hr over 30 Minutes Intravenous Every 12 hours 11/25/21 1128 11/25/21 1532   11/25/21 2200  cefTRIAXone (ROCEPHIN) 2 g in sodium chloride 0.9 % 100 mL IVPB        2 g 200 mL/hr over 30 Minutes Intravenous Every 24 hours 11/25/21 1515 11/29/21 2237   11/25/21 1615  azithromycin (ZITHROMAX) 500 mg in sodium chloride 0.9 % 250 mL IVPB  Status:  Discontinued         500 mg 250 mL/hr over 60 Minutes Intravenous Every 24 hours 11/25/21 1515 11/26/21 1429   11/25/21 0945  ceFEPIme (MAXIPIME) 2 g in sodium chloride 0.9 % 100 mL IVPB        2 g 200 mL/hr over 30 Minutes Intravenous  Once 11/25/21 0944 11/25/21 1043   11/25/21 0945  metroNIDAZOLE (FLAGYL) IVPB 500 mg        500 mg 100 mL/hr over 60 Minutes Intravenous  Once 11/25/21 0944 11/25/21 1148   11/25/21 0945  vancomycin (VANCOCIN) IVPB 1000 mg/200 mL premix        1,000 mg 200 mL/hr over 60 Minutes Intravenous  Once 11/25/21 0944 11/25/21 1304       Subjective: Seen and examined at bedside and she is sitting in the chair and husband thinks that she is doing much better and getting closer to her baseline.  The swelling in her body is improved and she worked with therapy and she improved and the physical therapist felt that she would benefit from CIR.  Her respiratory status is improved and she is no longer wearing supplemental oxygen or coughing.  No other concerns or complaints at this time.  Objective: Vitals:   11/30/21 0159 11/30/21 0550 11/30/21 0809 11/30/21 1325  BP: 119/75 117/73  (!) 144/78  Pulse: 84 82  (!) 108  Resp: '16 16  18  '$ Temp: 99 F (37.2 C) 98.9 F (37.2 C)  98.7 F (37.1 C)  TempSrc: Oral Oral    SpO2: 97% 96% 96% 98%  Weight:      Height:        Intake/Output Summary (Last 24 hours) at 11/30/2021 1619 Last data filed at 11/30/2021 1000 Gross per 24 hour  Intake 120 ml  Output 1670 ml  Net -1550 ml   Filed Weights   11/25/21 1417 11/26/21 0451 11/29/21 0500  Weight: 52 kg 52 kg 52.7 kg   Examination: Physical Exam:  Constitutional: WN/WD chronically ill-appearing Caucasian female in no acute distress sitting in the chair at bedside Respiratory: Diminished to auscultation bilaterally with coarse breath sounds, no wheezing, rales, rhonchi or crackles. Normal respiratory effort and patient is not tachypenic. No accessory muscle use.  Unlabored breathing and  is not wearing supplemental oxygen nasal cannula Cardiovascular: RRR, no murmurs / rubs / gallops. S1 and S2 auscultated.  No appreciable extremity edema Abdomen: Soft, non-tender, non-distended. Bowel sounds positive.  GU: Deferred. Musculoskeletal: No clubbing / cyanosis of digits/nails. No joint deformity upper and lower extremities.  Skin: No rashes, lesions, ulcers on limited skin evaluation. No induration; Warm and dry.  Neurologic: CN 2-12 grossly intact with no focal deficits but she does have some aphasia and has some right-sided weakness  Data Reviewed: I have personally reviewed following labs and imaging studies  CBC: Recent Labs  Lab 11/25/21 0950 11/25/21 1644 11/26/21 2979 11/27/21 0422 11/28/21 0449 11/29/21 0932  11/30/21 0436  WBC 3.8*   < > 3.0* 3.1* 3.5* 4.9 5.2  NEUTROABS 3.4  --   --   --   --  4.2 4.9  HGB 12.0   < > 9.8* 9.3* 9.5* 11.0* 9.1*  HCT 35.0*   < > 29.9* 28.4* 28.7* 32.1* 27.9*  MCV 105.4*   < > 108.7* 109.2* 108.7* 104.6* 109.0*  PLT 91*   < > 82* 74* 65* 67* 52*   < > = values in this interval not displayed.   Basic Metabolic Panel: Recent Labs  Lab 11/25/21 1644 11/26/21 0412 11/27/21 0422 11/28/21 0449 11/29/21 0932 11/30/21 0436  NA  --  142 142 141 141 141  K  --  3.9 4.1 4.3 3.8 4.5  CL  --  115* 115* 113* 108 109  CO2  --  '24 24 24 26 26  '$ GLUCOSE  --  118* 138* 143* 114* 117*  BUN  --  '20 14 16 15 18  '$ CREATININE 0.78 0.82 0.69 0.70 0.63 0.69  CALCIUM  --  7.6* 7.3* 7.6* 8.1* 7.8*  MG 1.8  --   --   --  1.9 2.1  PHOS  --   --   --  3.7 3.2 3.2   GFR: Estimated Creatinine Clearance: 64.3 mL/min (by C-G formula based on SCr of 0.69 mg/dL). Liver Function Tests: Recent Labs  Lab 11/25/21 0950 11/26/21 0412 11/27/21 0422 11/28/21 0449 11/29/21 0932 11/30/21 0436  AST 38 26 25  --  44* 58*  ALT '17 19 16  '$ --  24 48*  ALKPHOS 54 42 40  --  44 44  BILITOT 0.6 0.4 0.5  --  0.5 0.5  PROT 5.7* 4.3* 4.0*  --  5.4* 4.7*   ALBUMIN 3.2* 2.2* 1.9* 2.0* 2.3* 2.2*   No results for input(s): "LIPASE", "AMYLASE" in the last 168 hours. No results for input(s): "AMMONIA" in the last 168 hours. Coagulation Profile: Recent Labs  Lab 11/25/21 0950  INR 0.9   Cardiac Enzymes: No results for input(s): "CKTOTAL", "CKMB", "CKMBINDEX", "TROPONINI" in the last 168 hours. BNP (last 3 results) No results for input(s): "PROBNP" in the last 8760 hours. HbA1C: No results for input(s): "HGBA1C" in the last 72 hours. CBG: No results for input(s): "GLUCAP" in the last 168 hours. Lipid Profile: No results for input(s): "CHOL", "HDL", "LDLCALC", "TRIG", "CHOLHDL", "LDLDIRECT" in the last 72 hours. Thyroid Function Tests: No results for input(s): "TSH", "T4TOTAL", "FREET4", "T3FREE", "THYROIDAB" in the last 72 hours. Anemia Panel: No results for input(s): "VITAMINB12", "FOLATE", "FERRITIN", "TIBC", "IRON", "RETICCTPCT" in the last 72 hours. Sepsis Labs: Recent Labs  Lab 11/25/21 0940 11/25/21 1644  PROCALCITON  --  0.11  LATICACIDVEN 1.7  --     Recent Results (from the past 240 hour(s))  SARS Coronavirus 2 by RT PCR (hospital order, performed in [facility] hospital lab) *cepheid single result test* Anterior Nasal Swab     Status: None   Collection Time: 11/23/21 11:05 AM   Specimen: Anterior Nasal Swab  Result Value Ref Range Status   SARS Coronavirus 2 by RT PCR NEGATIVE NEGATIVE Final    Comment: (NOTE) SARS-CoV-2 target nucleic acids are NOT DETECTED.  The SARS-CoV-2 RNA is generally detectable in upper and lower respiratory specimens during the acute phase of infection. The lowest concentration of SARS-CoV-2 viral copies this assay can detect is 250 copies / mL. A negative result does not preclude SARS-CoV-2 infection and should not be used  as the sole basis for treatment or other patient management decisions.  A negative result may occur with improper specimen collection / handling, submission of specimen  other than nasopharyngeal swab, presence of viral mutation(s) within the areas targeted by this assay, and inadequate number of viral copies (<250 copies / mL). A negative result must be combined with clinical observations, patient history, and epidemiological information.  Fact Sheet for Patients:   https://www.patel.info/  Fact Sheet for Healthcare Providers: https://hall.com/  This test is not yet approved or  cleared by the [place] FDA and has been authorized for detection and/or diagnosis of SARS-CoV-2 by FDA under an Emergency Use Authorization (EUA).  This EUA will remain in effect (meaning this test can be used) for the duration of the COVID-19 declaration under Section 564(b)(1) of the Act, 21 U.S.C. section 360bbb-3(b)(1), unless the authorization is terminated or revoked sooner.  Performed at [facility], [address] [place], [place] [address]   Urine Culture     Status: Abnormal   Collection Time: 11/23/21  2:00 PM   Specimen: Urine, Clean Catch  Result Value Ref Range Status   Specimen Description   Final    URINE, CLEAN CATCH Performed at [facility], [address] [place], [place] [address]    Special Requests   Final    NONE Performed at [facility], [address], [place], [place] [address]    Culture (A)  Final    <10,000 COLONIES/mL INSIGNIFICANT GROWTH Performed at [facility] [address] [place], [place] [address]    Report Status 11/24/2021 FINAL  Final  Culture, blood (Routine x 2)     Status: None   Collection Time: 11/25/21  9:40 AM   Specimen: BLOOD LEFT FOREARM  Result Value Ref Range Status   Specimen Description   Final    BLOOD LEFT FOREARM Performed at [facility], [address], [place], [place] [address]    Special Requests   Final    BOTTLES DRAWN AEROBIC ONLY Blood Culture results may  not be optimal due to an inadequate volume of blood received in culture bottles Performed at [facility], [address], [place],  [address]    Culture   Final    NO GROWTH 5 DAYS Performed at [facility], [place] [address], [place],  [address]    Report Status 11/30/2021 FINAL  Final  Resp Panel by RT-PCR (Flu A&B, Covid) Anterior Nasal Swab     Status: None   Collection Time: 11/25/21  9:50 AM   Specimen: Anterior Nasal Swab  Result Value Ref Range Status   SARS Coronavirus 2 by RT PCR NEGATIVE NEGATIVE Final    Comment: (NOTE) SARS-CoV-2 target nucleic acids are NOT DETECTED.  The SARS-CoV-2 RNA is generally detectable in upper respiratory specimens during the acute phase of infection. The lowest concentration of SARS-CoV-2 viral copies this assay can detect is 138 copies/mL. A negative result does not preclude SARS-Cov-2 infection and should not be used as the sole basis for treatment or other patient management decisions. A negative result may occur with  improper specimen collection/handling, submission of specimen other than nasopharyngeal swab, presence of viral mutation(s) within the areas targeted by this assay, and inadequate number of viral copies(<138 copies/mL). A negative result must be combined with clinical observations, patient history, and epidemiological information. The expected result is Negative.  Fact Sheet for Patients:  EntrepreneurPulse.com.au  Fact Sheet for Healthcare  Providers:  IncredibleEmployment.be  This test is no t yet approved or cleared by the [place] and  has been authorized for detection and/or diagnosis of SARS-CoV-2 by FDA under an Emergency Use Authorization (EUA). This EUA will remain  in effect (meaning this test can be used) for the duration of the COVID-19 declaration under Section 564(b)(1) of the Act, 21 U.S.C.section 360bbb-3(b)(1), unless the  authorization is terminated  or revoked sooner.       Influenza A by PCR NEGATIVE NEGATIVE Final   Influenza B by PCR NEGATIVE NEGATIVE Final    Comment: (NOTE) The Xpert Xpress SARS-CoV-2/FLU/RSV plus assay is intended as an aid in the diagnosis of influenza from Nasopharyngeal swab specimens and should not be used as a sole basis for treatment. Nasal washings and aspirates are unacceptable for Xpert Xpress SARS-CoV-2/FLU/RSV testing.  Fact Sheet for Patients: EntrepreneurPulse.com.au  Fact Sheet for Healthcare Providers: IncredibleEmployment.be  This test is not yet approved or cleared by the [place] FDA and has been authorized for detection and/or diagnosis of SARS-CoV-2 by FDA under an Emergency Use Authorization (EUA). This EUA will remain in effect (meaning this test can be used) for the duration of the COVID-19 declaration under Section 564(b)(1) of the Act, 21 U.S.C. section 360bbb-3(b)(1), unless the authorization is terminated or revoked.  Performed at [facility], [address], [place], [place] [address]   Urine Culture     Status: None   Collection Time: 11/26/21  9:50 PM   Specimen: Urine, Clean Catch  Result Value Ref Range Status   Specimen Description   Final    URINE, CLEAN CATCH Performed at [facility], [place] [address] [place], [place] [address]    Special Requests   Final    NONE Performed at [facility], [place] [address] [place], [place] [address]    Culture   Final    NO GROWTH Performed at [facility], [place] [address], [place], [place] [address]    Report Status 11/27/2021 FINAL  Final     Radiology Studies: DG CHEST PORT 1 VIEW  Result Date: 11/30/2021 CLINICAL DATA:  [id number]; shortness of breath EXAM: PORTABLE CHEST 1 VIEW COMPARISON:  Chest x-ray from yesterday FINDINGS: The heart size and mediastinal contours are within normal  limits. There is mild bilateral pleural effusion greater on the left with bibasilar atelectasis and has improved in the interim. There is improvement of the interstitial edema seen as well. The visualized skeletal structures are unremarkable. IMPRESSION: There has been overall interval improvement of the interstitial edema, bilateral pleural effusion and bibasilar atelectasis. There is continued obliteration of the left hemidiaphragm seen. Electronically Signed   By: Frazier Richards M.D.   On: 11/30/2021 07:56   CT Angio Chest Pulmonary Embolism (PE) W or WO Contrast  Result Date: 11/29/2021 CLINICAL DATA:  Pulmonary embolism (PE) suspected, unknown D-dimer EXAM: CT ANGIOGRAPHY CHEST WITH CONTRAST TECHNIQUE: Multidetector CT imaging of the chest was performed using the standard protocol during bolus administration of intravenous contrast. Multiplanar CT image reconstructions and MIPs were obtained to evaluate the vascular anatomy. RADIATION DOSE REDUCTION: This exam was performed according to the departmental dose-optimization program which includes automated exposure control, adjustment of the mA and/or kV according to patient size and/or use of iterative reconstruction technique. CONTRAST:  51m OMNIPAQUE IOHEXOL 350 MG/ML SOLN COMPARISON:  None Available. FINDINGS: Cardiovascular: Adequate opacification of the pulmonary arterial tree. No intraluminal filling defect identified to suggest acute  pulmonary embolism. Central pulmonary arteries are of normal caliber. No significant coronary artery calcification. Cardiac size within normal limits. No pericardial effusion. No significant atherosclerotic calcification within the thoracic aorta. No aortic aneurysm. Mediastinum/Nodes: Visualized thyroid is unremarkable. No pathologic thoracic adenopathy. Esophagus unremarkable. Lungs/Pleura: The cavitary nodule within the left upper lobe appears relatively stable in size measuring 12 mm in mean diameter at axial image #  59/6, but demonstrates decreasing wall thicknessz and solidity suggesting an infectious or inflammatory process. Adjacent nodularity extending toward the pleural surface also appears improved. Right upper lobe pulmonary nodule, axial image # 40, 4 mm, stable Perifissural nodule, 5 mm, axial image # 83/6, stable Right lower lobe pulmonary nodule, 3 mm, axial image # 84/6, stable No new new pulmonary nodules or infiltrates are identified. Small bilateral pleural effusions have developed with increasing compressive atelectasis of the lung bases bilaterally. Diffuse ground-glass opacity has developed with smooth interlobular septal thickening best appreciated at the lung bases suggesting development of mild pulmonary edema. No pneumothorax. No central obstructing lesion. Upper Abdomen: No acute abnormality. Musculoskeletal: No chest wall abnormality. No acute or significant osseous findings. Review of the MIP images confirms the above findings. IMPRESSION: 1. No pulmonary embolism. 2. Interval development of small bilateral pleural effusions, diffuse ground-glass opacity, and smooth interlobular septal thickening in keeping with mild pulmonary edema. 3. Interval decrease in wall thickness and solidity of a cavitary nodule within the left upper lobe suggesting an infectious or inflammatory process. Short-term follow-up imaging in 3 months is recommended to document resolution. 4. Stable right pulmonary nodules since remote prior examination of 06/02/2019. Electronically Signed   By: Fidela Salisbury M.D.   On: 11/29/2021 22:09   DG CHEST PORT 1 VIEW  Result Date: 11/29/2021 CLINICAL DATA:  [id number].  Shortness of breath. EXAM: PORTABLE CHEST 1 VIEW COMPARISON:  Portable chest yesterday at 8:32 a.m. FINDINGS: 4:15 a.m. the heart is slightly enlarged. There are increased small left-greater-than-right pleural effusions. There is increased central vascular prominence and interstitial edema. There are patchy opacities of the  mid to lower lung fields with the interstitial edema and opacities showing a basal gradient and slightly denser on the left. The apical lungs remain clear.  The mediastinum is stable. IMPRESSION: 1. Increasing interstitial edema and opacities in the mid to lower lung fields. 2. Increasing small left-greater-than-right pleural effusions. 3. 1.5 cm cavitating nodule in the peripheral left mid lung on the recent CT is not well seen today. Electronically Signed   By: Telford Nab M.D.   On: 11/29/2021 05:41     Scheduled Meds:  amLODipine  10 mg Oral Daily   dexamethasone  2 mg Oral BID   guaiFENesin  1,200 mg Oral BID   ipratropium-albuterol  3 mL Nebulization TID   lacosamide  200 mg Oral BID   levETIRAcetam  1,500 mg Oral BID   lisinopril  20 mg Oral Daily   pantoprazole  40 mg Oral Daily   perampanel  4 mg Oral QHS   polyethylene glycol  17 g Oral BID   senna-docusate  1 tablet Oral BID   Continuous Infusions:   LOS: 5 days   Raiford Noble, DO [facility] Available via Epic secure chat 7am-7pm After these hours, please refer to coverage provider listed on [email or web address] 11/30/2021, 4:19 PM

## 2021-11-30 NOTE — Progress Notes (Signed)
° °  Inpatient Rehab Admissions Coordinator : ° °Per therapy change in recommendations, patient was screened for CIR candidacy by Annmargaret Decaprio RN MSN.  At this time patient appears to be a potential candidate for CIR. I will place a rehab consult per protocol for full assessment. Please call me with any questions. ° °Marva Hendryx RN MSN °Admissions Coordinator °[phone] °  °

## 2021-12-01 DIAGNOSIS — A419 Sepsis, unspecified organism: Secondary | ICD-10-CM | POA: Diagnosis not present

## 2021-12-01 DIAGNOSIS — J189 Pneumonia, unspecified organism: Secondary | ICD-10-CM | POA: Diagnosis not present

## 2021-12-01 DIAGNOSIS — C719 Malignant neoplasm of brain, unspecified: Secondary | ICD-10-CM | POA: Diagnosis not present

## 2021-12-01 DIAGNOSIS — I10 Essential (primary) hypertension: Secondary | ICD-10-CM | POA: Diagnosis not present

## 2021-12-01 LAB — COMPREHENSIVE METABOLIC PANEL
ALT: 60 U/L — ABNORMAL HIGH (ref 0–44)
AST: 60 U/L — ABNORMAL HIGH (ref 15–41)
Albumin: 2.4 g/dL — ABNORMAL LOW (ref 3.5–5.0)
Alkaline Phosphatase: 45 U/L (ref 38–126)
Anion gap: 7 (ref 5–15)
BUN: 19 mg/dL (ref 6–20)
CO2: 26 mmol/L (ref 22–32)
Calcium: 8.3 mg/dL — ABNORMAL LOW (ref 8.9–10.3)
Chloride: 110 mmol/L (ref 98–111)
Creatinine, Ser: 0.58 mg/dL (ref 0.44–1.00)
GFR, Estimated: >60 mL/min (ref >60)
Glucose, Bld: 90 mg/dL (ref 70–99)
Potassium: 4.4 mmol/L (ref 3.5–5.1)
Sodium: 143 mmol/L (ref 135–145)
Total Bilirubin: 0.6 mg/dL (ref 0.3–1.2)
Total Protein: 5.1 g/dL — ABNORMAL LOW (ref 6.5–8.1)

## 2021-12-01 LAB — PHOSPHORUS: Phosphorus: 4.2 mg/dL (ref 2.5–4.6)

## 2021-12-01 LAB — CBC WITH DIFFERENTIAL/PLATELET
Abs Immature Granulocytes: 0.03 10*3/uL (ref 0.00–0.07)
Basophils Absolute: 0 10*3/uL (ref 0.0–0.1)
Basophils Relative: 0 %
Eosinophils Absolute: 0 10*3/uL (ref 0.0–0.5)
Eosinophils Relative: 0 %
HCT: 30.2 % — ABNORMAL LOW (ref 36.0–46.0)
Hemoglobin: 9.7 g/dL — ABNORMAL LOW (ref 12.0–15.0)
Immature Granulocytes: 1 %
Lymphocytes Relative: 6 %
Lymphs Abs: 0.2 10*3/uL — ABNORMAL LOW (ref 0.7–4.0)
MCH: 35.4 pg — ABNORMAL HIGH (ref 26.0–34.0)
MCHC: 32.1 g/dL (ref 30.0–36.0)
MCV: 110.2 fL — ABNORMAL HIGH (ref 80.0–100.0)
Monocytes Absolute: 0.1 10*3/uL (ref 0.1–1.0)
Monocytes Relative: 3 %
Neutro Abs: 3.9 10*3/uL (ref 1.7–7.7)
Neutrophils Relative %: 90 %
Platelets: 44 10*3/uL — ABNORMAL LOW (ref 150–400)
RBC: 2.74 MIL/uL — ABNORMAL LOW (ref 3.87–5.11)
RDW: 16.1 % — ABNORMAL HIGH (ref 11.5–15.5)
WBC: 4.3 10*3/uL (ref 4.0–10.5)
nRBC: 0 % (ref 0.0–0.2)

## 2021-12-01 LAB — MAGNESIUM: Magnesium: 2.3 mg/dL (ref 1.7–2.4)

## 2021-12-01 NOTE — Progress Notes (Signed)
PROGRESS NOTE    Kristen Foley  LEX:[id number] DOB: Jul 24, 1965 DOA: 11/25/2021 PCP: Unk Pinto, MD   Brief Narrative:  The patient is a 56 year old Caucasian female with a history of GBM, seizures, admitted to the hospital with altered mental status and high fever.  Chest imaging indicated possible underlying pneumonia. Started on broad-spectrum antibiotics.  Sepsis work-up in process and she is improving however she spiked another temperature again today.  Repeat chest x-ray done and showed "Bibasilar pulmonary opacities, raising concern for aspiration or multifocal infection."  Speech therapy evaluated and did an MBS which recommends a regular solid and thin liquid diet for the patient.   Chest x-ray today showed some increasing interstitial edema and opacities in the mid to lower lung fields and also showed increasing small left greater than right pleural effusions.  She is given a dose of IV Lasix 40 mg because of this.  Her chest x-ray also showed a 1.5 cm cavitating nodule in the peripheral left midlung on the recent CT scan.  Given that she continues to spike temperatures daily we will need to evaluate for PE and a CTA of the PE protocol was ordered.   CT of the chest done and showed "Interval development of small bilateral pleural effusions, diffuse ground-glass opacity, and smooth interlobular  septal thickening in keeping with mild pulmonary edema. Interval decrease in wall  thickness and solidity of a cavitary nodule within the left upper lobe suggesting an infectious or inflammatory process. Short-term follow-up imaging  in 3 months is recommended to document resolution. Stable right pulmonary nodules since remote prior examination of 06/02/2019"   She is improving and PT OT now recommending CIR   Assessment and Plan:  Sepsis with acute respiratory failure with hypoxia -Secondary to possible pneumonia -Blood cultures have not shown any growth to date -Urinalysis without signs  of infection and showed small hemoglobin and urine culture negative -On admission, she was tachycardic, leukopenic and had altered mental status and this is improving -Continue antibiotics but de-escalated the broad-spectrum antibiotics to azithromycin and ceftriaxone and will continue for now -Fevers have resolved -Given her recurrent fevers and slight sinus tachycardia as well as her recent respiratory failure will need to evaluate for PE so we will order a CTA chest PE protocol given her hypercoagulable state in the setting of cancer -See CT of the chest as below -Repeat chest x-ray done and showed "Increasing interstitial edema and opacities in the mid to lower lung fields. Increasing small left-greater-than-right pleural effusions.1.5 cm cavitating nodule in the peripheral left mid lung on the recent CT is not well seen today." -Given the vascular congestion and pleural effusion noted she is given IV Lasix 40 mg and improved respiratory status and off of supplemental oxygen altogether now -Continuous pulse oximetry maintain O2 saturation greater than 90% -Strep pneumo urinary antigen and Legionella Serogp 1 Ur Ag is Negative  -Continue supplemental oxygen via nasal cannula wean O2 to room air and she is finally weaned to room air and doing well  -She will need an ambulatory home O2 screen prior to discharge and repeat chest x-ray in a.m. -PT OT recommending CIR now and if she does not discharged to inpatient rehab she will need to go home with home health with a wheelchair wheelchair cushion; Medically stable to be D/C'd    Cavitary lung nodule -Chest x-ray yesterday showed a 1.5 cm cavitating nodule in the peripheral left midlung -Getting a CT scan of the chest PE protocol with  contrast to further evaluate given her fevers -CT of the chest done and showed "Interval development of small bilateral pleural effusions, diffuse ground-glass opacity, and smooth interlobular  septal thickening in  keeping with mild pulmonary edema. Interval decrease in wall  thickness and solidity of a cavitary nodule within the left upper lobe suggesting an infectious or inflammatory process. Short-term follow-up imaging  in 3 months is recommended to document resolution. Stable right pulmonary nodules since remote prior examination of 06/02/2019" -We will have referral to pulmonary in outpatient setting   Acute metabolic encephalopathy, improving -Secondary to sepsis -Family notes that she was less responsive at home -CT head on admission unremarkable -Overall appears to be improving and husband thinks that she is closer to her baseline -Continue to monitor   Seizure disorder -Related to underlying brain mass -Continue on home seizure medicines including Keppra, Vimpat, as needed benzos -Continue seizure precautions   GBM -Followed at [facility] -Continued on chemotherapy -Continue on baseline dexamethasone dose -Recent CT Scan done and showed "Stable left frontal lobe mass consistent with glioma with surrounding edema and/or infiltrative tumor with mild regional mass effect but no midline shift. No new acute intracranial pathology." -Follow up with [facility] at D/C    Thrombocytopenia -Suspect is related to sepsis and history of cancer -Continue to monitor and Platelet count has gone from 82 -> 74 -> 65 -> 67 -> 52 -> 44 -Discontinue prophylactic dose of Lovenox and use SCDs   Hypertension -Continue lisinopril and amlodipine -Continue monitor blood pressures per protocol -Last blood pressure reading was 137/81   Hypoalbuminemia -Patient's albumin level has gone from 3.2 -> 2.2 -> 1.9 -> 2.0 -> 2.3 -> 2.2 -> 2.4 -Continue to monitor and trend and repeat CMP in a.m.   Generalized weakness -PT/OT reevaluated and now recommending inpatient rehabilitation -CIR consulted for further evaluation and if she does not go to inpatient rehab she will go home with home health   Pancytopenia -In  the setting of her cancer and chemotherapy -Patient's WBC was 3.5 and hemoglobin/hematocrit was 9.5/20.7 with a platelet count of 65 -Now patient's WBC is 4.3, hemoglobin/hematocrit 9.7/30.2 and MCV of 110.2, and a platelet count of 44 -Continue monitor and trend and repeat CBC in a.m.   Abnormal LFTs -Mild and likely reactive -AST went from 26 -> 25 -> 44 -> 58 -> 60 -ALT went from 19 -> 16 -> 24 -> 48 -> 60 -Continue to monitor and trend and repeat CMP in a.m. and if necessary will obtain a right upper quadrant ultrasound as well as an acute hepatitis panel   DVT prophylaxis: Place and maintain sequential compression device Start: 11/27/21 1459    Code Status: Full Code Family Communication: Discussed with Husband at bedside   Disposition Plan:  Level of care: Telemetry Status is: Inpatient Remains inpatient appropriate because: Needs CIR and awaiting Insurance Authorization   Consultants:  CIR  Procedures:  As above   Antimicrobials:  Anti-infectives (From admission, onward)    Start     Dose/Rate Route Frequency Ordered Stop   11/26/21 1600  azithromycin (ZITHROMAX) tablet 500 mg        500 mg Oral Every 24 hours 11/26/21 1429 11/29/21 1654   11/26/21 1300  vancomycin (VANCOREADY) IVPB 750 mg/150 mL  Status:  Discontinued        750 mg 150 mL/hr over 60 Minutes Intravenous Every 24 hours 11/25/21 1128 11/25/21 1527   11/25/21 2200  ceFEPIme (MAXIPIME) 2 g in sodium  chloride 0.9 % 100 mL IVPB  Status:  Discontinued        2 g 200 mL/hr over 30 Minutes Intravenous Every 12 hours 11/25/21 1128 11/25/21 1532   11/25/21 2200  cefTRIAXone (ROCEPHIN) 2 g in sodium chloride 0.9 % 100 mL IVPB        2 g 200 mL/hr over 30 Minutes Intravenous Every 24 hours 11/25/21 1515 11/29/21 2237   11/25/21 1615  azithromycin (ZITHROMAX) 500 mg in sodium chloride 0.9 % 250 mL IVPB  Status:  Discontinued        500 mg 250 mL/hr over 60 Minutes Intravenous Every 24 hours 11/25/21 1515 11/26/21  1429   11/25/21 0945  ceFEPIme (MAXIPIME) 2 g in sodium chloride 0.9 % 100 mL IVPB        2 g 200 mL/hr over 30 Minutes Intravenous  Once 11/25/21 0944 11/25/21 1043   11/25/21 0945  metroNIDAZOLE (FLAGYL) IVPB 500 mg        500 mg 100 mL/hr over 60 Minutes Intravenous  Once 11/25/21 0944 11/25/21 1148   11/25/21 0945  vancomycin (VANCOCIN) IVPB 1000 mg/200 mL premix        1,000 mg 200 mL/hr over 60 Minutes Intravenous  Once 11/25/21 0944 11/25/21 1304       Subjective: Seen and Examined at bedside she is sitting up in the bed without issues.  Slept better last night.  No nausea or vomiting.  Denies chest pain or shortness breath.  Has not been coughing up very much now.  Thinks her breathing status is stable.  Husband thinks that she is doing much better.  She is medically stable to be discharged and awaiting insurance authorization at [facility].  Objective: Vitals:   11/30/21 2008 12/01/21 0500 12/01/21 0525 12/01/21 0755  BP: (!) 145/76  137/81   Pulse: (!) 103  78   Resp: 16  16   Temp: 98.4 F (36.9 C)  98.4 F (36.9 C)   TempSrc: Oral  Oral   SpO2: 97%  99% 97%  Weight:  50.7 kg    Height:        Intake/Output Summary (Last 24 hours) at 12/01/2021 1251 Last data filed at 12/01/2021 1222 Gross per 24 hour  Intake 240 ml  Output 1250 ml  Net -1010 ml   Filed Weights   11/26/21 0451 11/29/21 0500 12/01/21 0500  Weight: 52 kg 52.7 kg 50.7 kg   Examination: Physical Exam:  Constitutional: WN/WD chronically ill-appearing Caucasian female currently no acute distress sitting up in the bed Respiratory: Slightly diminished to auscultation bilaterally, no wheezing, rales, rhonchi or crackles. Normal respiratory effort and patient is not tachypenic. No accessory muscle use.  Unlabored breathing and is not wearing supplemental oxygen via nasal cannula Cardiovascular: RRR, no murmurs / rubs / gallops. S1 and S2 auscultated. No extremity edema.  Abdomen: Soft, non-tender,  non-distended. Bowel sounds positive.  GU: Deferred. Musculoskeletal: No clubbing / cyanosis of digits/nails. No joint deformity upper and lower extremities.  Skin: No rashes, lesions, ulcers on limited skin evaluation. No induration; Warm and dry.  Neurologic: CN 2-12 grossly intact but she does have some significant aphasia and has some right-sided weakness worse on her arm and her leg.  Data Reviewed: I have personally reviewed following labs and imaging studies  CBC: Recent Labs  Lab 11/25/21 0950 11/25/21 1644 11/27/21 0422 11/28/21 0449 11/29/21 0932 11/30/21 0436 12/01/21 0751  WBC 3.8*   < > 3.1* 3.5* 4.9 5.2 4.3  NEUTROABS 3.4  --   --   --  4.2 4.9 3.9  HGB 12.0   < > 9.3* 9.5* 11.0* 9.1* 9.7*  HCT 35.0*   < > 28.4* 28.7* 32.1* 27.9* 30.2*  MCV 105.4*   < > 109.2* 108.7* 104.6* 109.0* 110.2*  PLT 91*   < > 74* 65* 67* 52* 44*   < > = values in this interval not displayed.   Basic Metabolic Panel: Recent Labs  Lab 11/25/21 1644 11/26/21 0412 11/27/21 0422 11/28/21 0449 11/29/21 0932 11/30/21 0436 12/01/21 0751  NA  --    < > 142 141 141 141 143  K  --    < > 4.1 4.3 3.8 4.5 4.4  CL  --    < > 115* 113* 108 109 110  CO2  --    < > '24 24 26 26 26  '$ GLUCOSE  --    < > 138* 143* 114* 117* 90  BUN  --    < > '14 16 15 18 19  '$ CREATININE 0.78   < > 0.69 0.70 0.63 0.69 0.58  CALCIUM  --    < > 7.3* 7.6* 8.1* 7.8* 8.3*  MG 1.8  --   --   --  1.9 2.1 2.3  PHOS  --   --   --  3.7 3.2 3.2 4.2   < > = values in this interval not displayed.   GFR: Estimated Creatinine Clearance: 64.3 mL/min (by C-G formula based on SCr of 0.58 mg/dL). Liver Function Tests: Recent Labs  Lab 11/26/21 0412 11/27/21 0422 11/28/21 0449 11/29/21 0932 11/30/21 0436 12/01/21 0751  AST 26 25  --  44* 58* 60*  ALT 19 16  --  24 48* 60*  ALKPHOS 42 40  --  44 44 45  BILITOT 0.4 0.5  --  0.5 0.5 0.6  PROT 4.3* 4.0*  --  5.4* 4.7* 5.1*  ALBUMIN 2.2* 1.9* 2.0* 2.3* 2.2* 2.4*   No results  for input(s): "LIPASE", "AMYLASE" in the last 168 hours. No results for input(s): "AMMONIA" in the last 168 hours. Coagulation Profile: Recent Labs  Lab 11/25/21 0950  INR 0.9   Cardiac Enzymes: No results for input(s): "CKTOTAL", "CKMB", "CKMBINDEX", "TROPONINI" in the last 168 hours. BNP (last 3 results) No results for input(s): "PROBNP" in the last 8760 hours. HbA1C: No results for input(s): "HGBA1C" in the last 72 hours. CBG: No results for input(s): "GLUCAP" in the last 168 hours. Lipid Profile: No results for input(s): "CHOL", "HDL", "LDLCALC", "TRIG", "CHOLHDL", "LDLDIRECT" in the last 72 hours. Thyroid Function Tests: No results for input(s): "TSH", "T4TOTAL", "FREET4", "T3FREE", "THYROIDAB" in the last 72 hours. Anemia Panel: No results for input(s): "VITAMINB12", "FOLATE", "FERRITIN", "TIBC", "IRON", "RETICCTPCT" in the last 72 hours. Sepsis Labs: Recent Labs  Lab 11/25/21 0940 11/25/21 1644  PROCALCITON  --  0.11  LATICACIDVEN 1.7  --     Recent Results (from the past 240 hour(s))  SARS Coronavirus 2 by RT PCR (hospital order, performed in [facility] hospital lab) *cepheid single result test* Anterior Nasal Swab     Status: None   Collection Time: 11/23/21 11:05 AM   Specimen: Anterior Nasal Swab  Result Value Ref Range Status   SARS Coronavirus 2 by RT PCR NEGATIVE NEGATIVE Final    Comment: (NOTE) SARS-CoV-2 target nucleic acids are NOT DETECTED.  The SARS-CoV-2 RNA is generally detectable in upper and lower respiratory specimens during the acute phase of  infection. The lowest concentration of SARS-CoV-2 viral copies this assay can detect is 250 copies / mL. A negative result does not preclude SARS-CoV-2 infection and should not be used as the sole basis for treatment or other patient management decisions.  A negative result may occur with improper specimen collection / handling, submission of specimen other than nasopharyngeal swab, presence of viral  mutation(s) within the areas targeted by this assay, and inadequate number of viral copies (<250 copies / mL). A negative result must be combined with clinical observations, patient history, and epidemiological information.  Fact Sheet for Patients:   https://www.patel.info/  Fact Sheet for Healthcare Providers: https://hall.com/  This test is not yet approved or  cleared by the [place] FDA and has been authorized for detection and/or diagnosis of SARS-CoV-2 by FDA under an Emergency Use Authorization (EUA).  This EUA will remain in effect (meaning this test can be used) for the duration of the COVID-19 declaration under Section 564(b)(1) of the Act, 21 U.S.C. section 360bbb-3(b)(1), unless the authorization is terminated or revoked sooner.  Performed at [facility], [address] [place], [place] [address]   Urine Culture     Status: Abnormal   Collection Time: 11/23/21  2:00 PM   Specimen: Urine, Clean Catch  Result Value Ref Range Status   Specimen Description   Final    URINE, CLEAN CATCH Performed at [facility], [address] [place], [place] [address]    Special Requests   Final    NONE Performed at [facility], [address] [place], [place] [address]    Culture (A)  Final    <10,000 COLONIES/mL INSIGNIFICANT GROWTH Performed at [facility] [address], [place], [place] [address]    Report Status 11/24/2021 FINAL  Final  Culture, blood (Routine x 2)     Status: None   Collection Time: 11/25/21  9:40 AM   Specimen: BLOOD LEFT FOREARM  Result Value Ref Range Status   Specimen Description   Final    BLOOD LEFT FOREARM Performed at [facility], [address] [place], [place] [address]    Special Requests   Final    BOTTLES DRAWN AEROBIC ONLY Blood Culture results may not be optimal due to an inadequate volume of  blood received in culture bottles Performed at [facility], [address], [place], [place] [address]    Culture   Final    NO GROWTH 5 DAYS Performed at [facility], [place] [address], [place], [place] [address]    Report Status 11/30/2021 FINAL  Final  Resp Panel by RT-PCR (Flu A&B, Covid) Anterior Nasal Swab     Status: None   Collection Time: 11/25/21  9:50 AM   Specimen: Anterior Nasal Swab  Result Value Ref Range Status   SARS Coronavirus 2 by RT PCR NEGATIVE NEGATIVE Final    Comment: (NOTE) SARS-CoV-2 target nucleic acids are NOT DETECTED.  The SARS-CoV-2 RNA is generally detectable in upper respiratory specimens during the acute phase of infection. The lowest concentration of SARS-CoV-2 viral copies this assay can detect is 138 copies/mL. A negative result does not preclude SARS-Cov-2 infection and should not be used as the sole basis for treatment or other patient management decisions. A negative result may occur with  improper specimen collection/handling, submission of specimen other than nasopharyngeal swab, presence of viral mutation(s) within the areas targeted by this assay, and inadequate number of viral copies(<138 copies/mL). A  negative result must be combined with clinical observations, patient history, and epidemiological information. The expected result is Negative.  Fact Sheet for Patients:  EntrepreneurPulse.com.au  Fact Sheet for Healthcare Providers:  IncredibleEmployment.be  This test is no t yet approved or cleared by the [place] FDA and  has been authorized for detection and/or diagnosis of SARS-CoV-2 by FDA under an Emergency Use Authorization (EUA). This EUA will remain  in effect (meaning this test can be used) for the duration of the COVID-19 declaration under Section 564(b)(1) of the Act, 21 U.S.C.section 360bbb-3(b)(1), unless the authorization is terminated  or revoked  sooner.       Influenza A by PCR NEGATIVE NEGATIVE Final   Influenza B by PCR NEGATIVE NEGATIVE Final    Comment: (NOTE) The Xpert Xpress SARS-CoV-2/FLU/RSV plus assay is intended as an aid in the diagnosis of influenza from Nasopharyngeal swab specimens and should not be used as a sole basis for treatment. Nasal washings and aspirates are unacceptable for Xpert Xpress SARS-CoV-2/FLU/RSV testing.  Fact Sheet for Patients: EntrepreneurPulse.com.au  Fact Sheet for Healthcare Providers: IncredibleEmployment.be  This test is not yet approved or cleared by the [place] FDA and has been authorized for detection and/or diagnosis of SARS-CoV-2 by FDA under an Emergency Use Authorization (EUA). This EUA will remain in effect (meaning this test can be used) for the duration of the COVID-19 declaration under Section 564(b)(1) of the Act, 21 U.S.C. section 360bbb-3(b)(1), unless the authorization is terminated or revoked.  Performed at [facility], [address], [place], [place] [address]   Urine Culture     Status: None   Collection Time: 11/26/21  9:50 PM   Specimen: Urine, Clean Catch  Result Value Ref Range Status   Specimen Description   Final    URINE, CLEAN CATCH Performed at [facility], [place] [address], [place], [place] [address]    Special Requests   Final    NONE Performed at [facility], [place] [address], [place], [place] [address]    Culture   Final    NO GROWTH Performed at [facility], [place] [address], [place], [place] [address]    Report Status 11/27/2021 FINAL  Final     Radiology Studies: DG CHEST PORT 1 VIEW  Result Date: 11/30/2021 CLINICAL DATA:  [id number]; shortness of breath EXAM: PORTABLE CHEST 1 VIEW COMPARISON:  Chest x-ray from yesterday FINDINGS: The heart size and mediastinal contours are within normal limits. There is mild bilateral pleural  effusion greater on the left with bibasilar atelectasis and has improved in the interim. There is improvement of the interstitial edema seen as well. The visualized skeletal structures are unremarkable. IMPRESSION: There has been overall interval improvement of the interstitial edema, bilateral pleural effusion and bibasilar atelectasis. There is continued obliteration of the left hemidiaphragm seen. Electronically Signed   By: Frazier Richards M.D.   On: 11/30/2021 07:56   CT Angio Chest Pulmonary Embolism (PE) W or WO Contrast  Result Date: 11/29/2021 CLINICAL DATA:  Pulmonary embolism (PE) suspected, unknown D-dimer EXAM: CT ANGIOGRAPHY CHEST WITH CONTRAST TECHNIQUE: Multidetector CT imaging of the chest was performed using the standard protocol during bolus administration of intravenous contrast. Multiplanar CT image reconstructions and MIPs were obtained to evaluate the vascular anatomy. RADIATION DOSE REDUCTION: This exam was performed according to the departmental dose-optimization program which includes automated exposure control, adjustment of the mA and/or kV according to patient size and/or use of iterative reconstruction  technique. CONTRAST:  53m OMNIPAQUE IOHEXOL 350 MG/ML SOLN COMPARISON:  None Available. FINDINGS: Cardiovascular: Adequate opacification of the pulmonary arterial tree. No intraluminal filling defect identified to suggest acute pulmonary embolism. Central pulmonary arteries are of normal caliber. No significant coronary artery calcification. Cardiac size within normal limits. No pericardial effusion. No significant atherosclerotic calcification within the thoracic aorta. No aortic aneurysm. Mediastinum/Nodes: Visualized thyroid is unremarkable. No pathologic thoracic adenopathy. Esophagus unremarkable. Lungs/Pleura: The cavitary nodule within the left upper lobe appears relatively stable in size measuring 12 mm in mean diameter at axial image # 59/6, but demonstrates decreasing wall  thicknessz and solidity suggesting an infectious or inflammatory process. Adjacent nodularity extending toward the pleural surface also appears improved. Right upper lobe pulmonary nodule, axial image # 40, 4 mm, stable Perifissural nodule, 5 mm, axial image # 83/6, stable Right lower lobe pulmonary nodule, 3 mm, axial image # 84/6, stable No new new pulmonary nodules or infiltrates are identified. Small bilateral pleural effusions have developed with increasing compressive atelectasis of the lung bases bilaterally. Diffuse ground-glass opacity has developed with smooth interlobular septal thickening best appreciated at the lung bases suggesting development of mild pulmonary edema. No pneumothorax. No central obstructing lesion. Upper Abdomen: No acute abnormality. Musculoskeletal: No chest wall abnormality. No acute or significant osseous findings. Review of the MIP images confirms the above findings. IMPRESSION: 1. No pulmonary embolism. 2. Interval development of small bilateral pleural effusions, diffuse ground-glass opacity, and smooth interlobular septal thickening in keeping with mild pulmonary edema. 3. Interval decrease in wall thickness and solidity of a cavitary nodule within the left upper lobe suggesting an infectious or inflammatory process. Short-term follow-up imaging in 3 months is recommended to document resolution. 4. Stable right pulmonary nodules since remote prior examination of 06/02/2019. Electronically Signed   By: AFidela SalisburyM.D.   On: 11/29/2021 22:09     Scheduled Meds:  amLODipine  10 mg Oral Daily   dexamethasone  2 mg Oral BID   guaiFENesin  1,200 mg Oral BID   ipratropium-albuterol  3 mL Nebulization TID   lacosamide  200 mg Oral BID   levETIRAcetam  1,500 mg Oral BID   lisinopril  20 mg Oral Daily   pantoprazole  40 mg Oral Daily   perampanel  4 mg Oral QHS   polyethylene glycol  17 g Oral BID   senna-docusate  1 tablet Oral BID   Continuous Infusions:   LOS: 6  days   ORaiford Noble DO [facility] Available via Epic secure chat 7am-7pm After these hours, please refer to coverage provider listed on [email or web address] 12/01/2021, 12:51 PM

## 2021-12-01 NOTE — Progress Notes (Addendum)
Inpatient Rehab Admissions:  Inpatient Rehab Consult received.  I spoke with pt's husband [name] on the telephone for rehabilitation assessment and to discuss goals and expectations of an inpatient rehab admission.  Discussed average length of stay; visitation policy; discharge home; insurance authorization requirement for pt's insurance. He acknowledged understanding. He is interested in pt pursuing CIR. He confirmed that he and other family members will be able to provide 24/7 support for pt after discharge. Will continue to follow.  ADDENDUM 1538: Started Ship broker.   Signed: Gayland Curry, [place], [place] Admissions Coordinator [phone]

## 2021-12-02 ENCOUNTER — Telehealth: Payer: Self-pay | Admitting: Pulmonary Disease

## 2021-12-02 DIAGNOSIS — A419 Sepsis, unspecified organism: Secondary | ICD-10-CM | POA: Diagnosis not present

## 2021-12-02 DIAGNOSIS — I10 Essential (primary) hypertension: Secondary | ICD-10-CM | POA: Diagnosis not present

## 2021-12-02 DIAGNOSIS — J189 Pneumonia, unspecified organism: Secondary | ICD-10-CM | POA: Diagnosis not present

## 2021-12-02 DIAGNOSIS — C719 Malignant neoplasm of brain, unspecified: Secondary | ICD-10-CM | POA: Diagnosis not present

## 2021-12-02 LAB — CBC WITH DIFFERENTIAL/PLATELET
Abs Immature Granulocytes: 0.04 10*3/uL (ref 0.00–0.07)
Basophils Absolute: 0 10*3/uL (ref 0.0–0.1)
Basophils Relative: 0 %
Eosinophils Absolute: 0 10*3/uL (ref 0.0–0.5)
Eosinophils Relative: 0 %
HCT: 28 % — ABNORMAL LOW (ref 36.0–46.0)
Hemoglobin: 8.9 g/dL — ABNORMAL LOW (ref 12.0–15.0)
Immature Granulocytes: 1 %
Lymphocytes Relative: 6 %
Lymphs Abs: 0.2 10*3/uL — ABNORMAL LOW (ref 0.7–4.0)
MCH: 35.7 pg — ABNORMAL HIGH (ref 26.0–34.0)
MCHC: 31.8 g/dL (ref 30.0–36.0)
MCV: 112.4 fL — ABNORMAL HIGH (ref 80.0–100.0)
Monocytes Absolute: 0.1 10*3/uL (ref 0.1–1.0)
Monocytes Relative: 3 %
Neutro Abs: 3.7 10*3/uL (ref 1.7–7.7)
Neutrophils Relative %: 90 %
Platelets: 38 10*3/uL — ABNORMAL LOW (ref 150–400)
RBC: 2.49 MIL/uL — ABNORMAL LOW (ref 3.87–5.11)
RDW: 16 % — ABNORMAL HIGH (ref 11.5–15.5)
WBC: 4.1 10*3/uL (ref 4.0–10.5)
nRBC: 0 % (ref 0.0–0.2)

## 2021-12-02 LAB — COMPREHENSIVE METABOLIC PANEL
ALT: 53 U/L — ABNORMAL HIGH (ref 0–44)
AST: 39 U/L (ref 15–41)
Albumin: 2.3 g/dL — ABNORMAL LOW (ref 3.5–5.0)
Alkaline Phosphatase: 43 U/L (ref 38–126)
Anion gap: 7 (ref 5–15)
BUN: 25 mg/dL — ABNORMAL HIGH (ref 6–20)
CO2: 24 mmol/L (ref 22–32)
Calcium: 8.2 mg/dL — ABNORMAL LOW (ref 8.9–10.3)
Chloride: 111 mmol/L (ref 98–111)
Creatinine, Ser: 0.61 mg/dL (ref 0.44–1.00)
GFR, Estimated: >60 mL/min (ref >60)
Glucose, Bld: 98 mg/dL (ref 70–99)
Potassium: 4.2 mmol/L (ref 3.5–5.1)
Sodium: 142 mmol/L (ref 135–145)
Total Bilirubin: 0.6 mg/dL (ref 0.3–1.2)
Total Protein: 4.9 g/dL — ABNORMAL LOW (ref 6.5–8.1)

## 2021-12-02 LAB — PHOSPHORUS: Phosphorus: 3.7 mg/dL (ref 2.5–4.6)

## 2021-12-02 LAB — MAGNESIUM: Magnesium: 2.3 mg/dL (ref 1.7–2.4)

## 2021-12-02 MED ORDER — IPRATROPIUM-ALBUTEROL 0.5-2.5 (3) MG/3ML IN SOLN
3.0000 mL | Freq: Two times a day (BID) | RESPIRATORY_TRACT | Status: DC
Start: 2021-12-02 — End: 2021-12-02
  Administered 2021-12-02: 3 mL via RESPIRATORY_TRACT
  Filled 2021-12-02: qty 3

## 2021-12-02 MED ORDER — IPRATROPIUM-ALBUTEROL 0.5-2.5 (3) MG/3ML IN SOLN
3.0000 mL | RESPIRATORY_TRACT | Status: DC
Start: 2021-12-03 — End: 2021-12-03

## 2021-12-02 NOTE — TOC Progression Note (Signed)
Transition of Care [facility]) - Progression Note    Patient Details  Name: Kristen Foley MRN: [id number] Date of Birth: 1965/08/24  Transition of [facility]) CM/SW Contact  Dracen Reigle, Juliann Pulse, RN Phone Number: 12/02/2021, 1:22 PM  Clinical Narrative: Denied insurance auth for CIR-Per attending will do peer to peer in am since insurance office closed today.      Expected Discharge Plan: [name] (vs. home with home health) Barriers to Discharge: Continued Medical Work up  Expected Discharge Plan and Services Expected Discharge Plan: [name] (vs. home with home health)     Post Acute Care Choice: IP Rehab Living arrangements for the past 2 months: Single Family Home                                       Social Determinants of Health (SDOH) Interventions    Readmission Risk Interventions    11/30/2021   12:57 PM  Readmission Risk Prevention Plan  Post Dischage Appt Complete  Medication Screening Complete  Transportation Screening Complete

## 2021-12-02 NOTE — Progress Notes (Signed)
PROGRESS NOTE    ERCIE ELIASEN  JJK:[id number] DOB: May 21, 1965 DOA: 11/25/2021 PCP: Unk Pinto, MD   Brief Narrative:  The patient is a 56 year old Caucasian female with a history of GBM, seizures, admitted to the hospital with altered mental status and high fever.  Chest imaging indicated possible underlying pneumonia. Started on broad-spectrum antibiotics.  Sepsis work-up in process and she is improving however she spiked another temperature again today.  Repeat chest x-ray done and showed "Bibasilar pulmonary opacities, raising concern for aspiration or multifocal infection."  Speech therapy evaluated and did an MBS which recommends a regular solid and thin liquid diet for the patient.   Chest x-ray today showed some increasing interstitial edema and opacities in the mid to lower lung fields and also showed increasing small left greater than right pleural effusions.  She is given a dose of IV Lasix 40 mg because of this.  Her chest x-ray also showed a 1.5 cm cavitating nodule in the peripheral left midlung on the recent CT scan.  Given that she continues to spike temperatures daily we will need to evaluate for PE and a CTA of the PE protocol was ordered.   CT of the chest done and showed "Interval development of small bilateral pleural effusions, diffuse ground-glass opacity, and smooth interlobular  septal thickening in keeping with mild pulmonary edema. Interval decrease in wall  thickness and solidity of a cavitary nodule within the left upper lobe suggesting an infectious or inflammatory process. Short-term follow-up imaging  in 3 months is recommended to document resolution. Stable right pulmonary nodules since remote prior examination of 06/02/2019"   She is improving and PT OT now recommending CIR and she is medically stable to D/C. Her Platelet Count continues to Drop so case was discussed with Oncology and they feel that it is related to her Recent chemotherapy earlier this Month.    Assessment and Plan:  Sepsis with acute respiratory failure with hypoxia -Secondary to possible pneumonia -Blood cultures have not shown any growth to date -Urinalysis without signs of infection and showed small hemoglobin and urine culture negative -On admission, she was tachycardic, leukopenic and had altered mental status and this is improving -Continue antibiotics but de-escalated the broad-spectrum antibiotics to azithromycin and ceftriaxone and will continue for now -Fevers have resolved -Given her recurrent fevers and slight sinus tachycardia as well as her recent respiratory failure will need to evaluate for PE so we will order a CTA chest PE protocol given her hypercoagulable state in the setting of cancer -See CT of the chest as below -Repeat chest x-ray done and showed "Increasing interstitial edema and opacities in the mid to lower lung fields. Increasing small left-greater-than-right pleural effusions.1.5 cm cavitating nodule in the peripheral left mid lung on the recent CT is not well seen today." -Given the vascular congestion and pleural effusion noted she is given IV Lasix 40 mg and improved respiratory status and off of supplemental oxygen altogether now -Continuous pulse oximetry maintain O2 saturation greater than 90% -Strep pneumo urinary antigen and Legionella Serogp 1 Ur Ag is Negative  -Continue supplemental oxygen via nasal cannula wean O2 to room air and she is finally weaned to room air and doing well  -She will need an ambulatory home O2 screen prior to discharge and repeat chest x-ray in a.m. -PT OT recommending CIR now and if she does not discharged to inpatient rehab she will need to go home with home health with a wheelchair wheelchair cushion; Medically stable  to be D/C'd currently and awaiting Insurance Auth at [place] lung nodule -Chest x-ray yesterday showed a 1.5 cm cavitating nodule in the peripheral left midlung -Getting a CT scan of the chest  PE protocol with contrast to further evaluate given her fevers -CT of the chest done and showed "Interval development of small bilateral pleural effusions, diffuse ground-glass opacity, and smooth interlobular  septal thickening in keeping with mild pulmonary edema. Interval decrease in wall  thickness and solidity of a cavitary nodule within the left upper lobe suggesting an infectious or inflammatory process. Short-term follow-up imaging  in 3 months is recommended to document resolution. Stable right pulmonary nodules since remote prior examination of 06/02/2019" -We will have referral to pulmonary in outpatient setting   Acute Metabolic Encephalopathy, improved -Secondary to sepsis -Family notes that she was less responsive at home -CT head on admission unremarkable -Overall appears to be improving and husband thinks that she is closer to her baseline -Continue to monitor  Elevated BUN -In the setting of Steroids as she is on Dexamethasone -BUN went from 14 -> 16 ->15 -> 18 -> 19 -> 25 -Continue to Monitor and Trend -Repeat CMP in the AM    Seizure disorder -Related to underlying brain mass -Continue on home seizure medicines including Keppra, Vimpat, as needed benzos -Continue seizure precautions   GBM -Followed at [facility] -Continued on chemotherapy with Avastin and CCNU/Gleostine  -C/w Dexamethasone 2 mg po BID -Continue on baseline dexamethasone dose -Recent CT Scan done and showed "Stable left frontal lobe mass consistent with glioma with surrounding edema and/or infiltrative tumor with mild regional mass effect but no midline shift. No new acute intracranial pathology." -Follow up with [facility] at D/C; recently received chemotherapy on 8/4 or 11/12/2021   Thrombocytopenia -Suspect is related to sepsis and history of cancer -Continue to monitor and Platelet count has gone from 82 -> 74 -> 65 -> 67 -> 52 -> 44 -> 38 -I spoke with Dr. Julieanne Manson of oncology who evaluated  the case and feels that her platelet count is dropping in the setting of her chemotherapy with L[place]ustine as she received it on either 11/10/2018 or 11/12/2021 at [facility].  He recommends continue to monitor platelet counts and monitoring for signs or symptoms of bleeding but feels that if he continues to drop to call her primary oncologist at [facility] for further evaluation and recommendations -Discontinue prophylactic dose of Lovenox and use SCDs   Hypertension -Continue lisinopril and amlodipine -Continue monitor blood pressures per protocol -Last blood pressure reading was 126/87   Hypoalbuminemia -Patient's albumin level has gone from 3.2 -> 2.2 -> 1.9 -> 2.0 -> 2.3 -> 2.2 -> 2.4 -> 2.3 -Continue to monitor and trend and repeat CMP in a.m.   Generalized weakness -PT/OT reevaluated and now recommending inpatient rehabilitation -CIR consulted for further evaluation and if she does not go to inpatient rehab she will go home with home health   Pancytopenia -In the setting of her cancer and chemotherapy -Patient's WBC was 3.5 and hemoglobin/hematocrit was 9.5/20.7 with a platelet count of 65 -Now patient's WBC is 4.1, hemoglobin/hematocrit 8.9/28.0 and MCV of 112.4, and a platelet count of 38 -Continue monitor and trend and repeat CBC in a.m.   Abnormal LFTs -Mild and likely reactive -AST went from 26 -> 25 -> 44 -> 58 -> 60 -> 39 -ALT went from 19 -> 16 -> 24 -> 48 -> 60 -> 53 -Continue to monitor and trend and  repeat CMP in a.m. and if necessary will obtain a right upper quadrant ultrasound as well as an acute hepatitis panel   DVT prophylaxis: Place and maintain sequential compression device Start: 11/27/21 1459    Code Status: Full Code Family Communication: Discussed with Husband at bedside   Disposition Plan:  Level of care: Telemetry Status is: Inpatient Remains inpatient appropriate because: She is medically stable to stable to CIR and awaiting Insurance Authorization   Consultants:   Discussed the case with Dr. Benay Spice CIR  Procedures:  None  Antimicrobials:  Anti-infectives (From admission, onward)    Start     Dose/Rate Route Frequency Ordered Stop   11/26/21 1600  azithromycin (ZITHROMAX) tablet 500 mg        500 mg Oral Every 24 hours 11/26/21 1429 11/29/21 1654   11/26/21 1300  vancomycin (VANCOREADY) IVPB 750 mg/150 mL  Status:  Discontinued        750 mg 150 mL/hr over 60 Minutes Intravenous Every 24 hours 11/25/21 1128 11/25/21 1527   11/25/21 2200  ceFEPIme (MAXIPIME) 2 g in sodium chloride 0.9 % 100 mL IVPB  Status:  Discontinued        2 g 200 mL/hr over 30 Minutes Intravenous Every 12 hours 11/25/21 1128 11/25/21 1532   11/25/21 2200  cefTRIAXone (ROCEPHIN) 2 g in sodium chloride 0.9 % 100 mL IVPB        2 g 200 mL/hr over 30 Minutes Intravenous Every 24 hours 11/25/21 1515 11/29/21 2237   11/25/21 1615  azithromycin (ZITHROMAX) 500 mg in sodium chloride 0.9 % 250 mL IVPB  Status:  Discontinued        500 mg 250 mL/hr over 60 Minutes Intravenous Every 24 hours 11/25/21 1515 11/26/21 1429   11/25/21 0945  ceFEPIme (MAXIPIME) 2 g in sodium chloride 0.9 % 100 mL IVPB        2 g 200 mL/hr over 30 Minutes Intravenous  Once 11/25/21 0944 11/25/21 1043   11/25/21 0945  metroNIDAZOLE (FLAGYL) IVPB 500 mg        500 mg 100 mL/hr over 60 Minutes Intravenous  Once 11/25/21 0944 11/25/21 1148   11/25/21 0945  vancomycin (VANCOCIN) IVPB 1000 mg/200 mL premix        1,000 mg 200 mL/hr over 60 Minutes Intravenous  Once 11/25/21 0944 11/25/21 1304        Subjective: Seen and examined at bedside and she is doing well.  No fevers.  Swelling is improved.  Husband thinks that she does not have as much feeling in her right leg today as she did yesterday and this is intermittent.  Platelet count continues to drop so case was discussed with oncology Dr. Benay Spice and he feels it is related to her chemotherapy.  She has no complaints is medically stable for discharge  to CIR.  Objective: Vitals:   12/01/21 2033 12/01/21 2100 12/02/21 0647 12/02/21 0814  BP:  125/80 126/87   Pulse:  94 84   Resp:  16 16   Temp:  98.3 F (36.8 C) 98.3 F (36.8 C)   TempSrc:   Oral   SpO2: 99%  99% 97%  Weight:      Height:        Intake/Output Summary (Last 24 hours) at 12/02/2021 1213 Last data filed at 12/02/2021 1000 Gross per 24 hour  Intake 500 ml  Output 2350 ml  Net -1850 ml   Filed Weights   11/26/21 0451 11/29/21 0500 12/01/21 0500  Weight: 52 kg 52.7 kg 50.7 kg   Examination: Physical Exam:  Constitutional: WN/WD chronically ill-appearing Caucasian female currently in no acute distress seen up in the bed  Respiratory: Mildly diminished to auscultation bilaterally with coarse breath sounds, no wheezing, rales, rhonchi or crackles. Normal respiratory effort and patient is not tachypenic. No accessory muscle use.  Unlabored breathing and is not wearing supplemental oxygen via nasal cannula Cardiovascular: RRR, no murmurs / rubs / gallops. S1 and S2 auscultated. No extremity edema. Abdomen: Soft, non-tender, non-distended. Bowel sounds positive.  GU: Deferred. Musculoskeletal: No clubbing / cyanosis of digits/nails. No joint deformity upper and lower extremities.  Skin: No rashes, lesions, ulcers on limited skin evaluation. No induration; Warm and dry.  Neurologic: CN 2-12 grossly intact with no focal deficits but she does have some significant aphasia and some right-sided weakness    Data Reviewed: I have personally reviewed following labs and imaging studies  CBC: Recent Labs  Lab 11/28/21 0449 11/29/21 0932 11/30/21 0436 12/01/21 0751 12/02/21 0503  WBC 3.5* 4.9 5.2 4.3 4.1  NEUTROABS  --  4.2 4.9 3.9 3.7  HGB 9.5* 11.0* 9.1* 9.7* 8.9*  HCT 28.7* 32.1* 27.9* 30.2* 28.0*  MCV 108.7* 104.6* 109.0* 110.2* 112.4*  PLT 65* 67* 52* 44* 38*   Basic Metabolic Panel: Recent Labs  Lab 11/25/21 1644 11/26/21 0412 11/28/21 0449 11/29/21 0932  11/30/21 0436 12/01/21 0751 12/02/21 0503  NA  --    < > 141 141 141 143 142  K  --    < > 4.3 3.8 4.5 4.4 4.2  CL  --    < > 113* 108 109 110 111  CO2  --    < > '24 26 26 26 24  '$ GLUCOSE  --    < > 143* 114* 117* 90 98  BUN  --    < > '16 15 18 19 '$ 25*  CREATININE 0.78   < > 0.70 0.63 0.69 0.58 0.61  CALCIUM  --    < > 7.6* 8.1* 7.8* 8.3* 8.2*  MG 1.8  --   --  1.9 2.1 2.3 2.3  PHOS  --   --  3.7 3.2 3.2 4.2 3.7   < > = values in this interval not displayed.   GFR: Estimated Creatinine Clearance: 64.3 mL/min (by C-G formula based on SCr of 0.61 mg/dL). Liver Function Tests: Recent Labs  Lab 11/27/21 0422 11/28/21 0449 11/29/21 0932 11/30/21 0436 12/01/21 0751 12/02/21 0503  AST 25  --  44* 58* 60* 39  ALT 16  --  24 48* 60* 53*  ALKPHOS 40  --  44 44 45 43  BILITOT 0.5  --  0.5 0.5 0.6 0.6  PROT 4.0*  --  5.4* 4.7* 5.1* 4.9*  ALBUMIN 1.9* 2.0* 2.3* 2.2* 2.4* 2.3*   No results for input(s): "LIPASE", "AMYLASE" in the last 168 hours. No results for input(s): "AMMONIA" in the last 168 hours. Coagulation Profile: No results for input(s): "INR", "PROTIME" in the last 168 hours. Cardiac Enzymes: No results for input(s): "CKTOTAL", "CKMB", "CKMBINDEX", "TROPONINI" in the last 168 hours. BNP (last 3 results) No results for input(s): "PROBNP" in the last 8760 hours. HbA1C: No results for input(s): "HGBA1C" in the last 72 hours. CBG: No results for input(s): "GLUCAP" in the last 168 hours. Lipid Profile: No results for input(s): "CHOL", "HDL", "LDLCALC", "TRIG", "CHOLHDL", "LDLDIRECT" in the last 72 hours. Thyroid Function Tests: No results for input(s): "TSH", "T4TOTAL", "FREET4", "T3FREE", "THYROIDAB"  in the last 72 hours. Anemia Panel: No results for input(s): "VITAMINB12", "FOLATE", "FERRITIN", "TIBC", "IRON", "RETICCTPCT" in the last 72 hours. Sepsis Labs: Recent Labs  Lab 11/25/21 1644  PROCALCITON 0.11    Recent Results (from the past 240 hour(s))  SARS  Coronavirus 2 by RT PCR (hospital order, performed in [facility] hospital lab) *cepheid single result test* Anterior Nasal Swab     Status: None   Collection Time: 11/23/21 11:05 AM   Specimen: Anterior Nasal Swab  Result Value Ref Range Status   SARS Coronavirus 2 by RT PCR NEGATIVE NEGATIVE Final    Comment: (NOTE) SARS-CoV-2 target nucleic acids are NOT DETECTED.  The SARS-CoV-2 RNA is generally detectable in upper and lower respiratory specimens during the acute phase of infection. The lowest concentration of SARS-CoV-2 viral copies this assay can detect is 250 copies / mL. A negative result does not preclude SARS-CoV-2 infection and should not be used as the sole basis for treatment or other patient management decisions.  A negative result may occur with improper specimen collection / handling, submission of specimen other than nasopharyngeal swab, presence of viral mutation(s) within the areas targeted by this assay, and inadequate number of viral copies (<250 copies / mL). A negative result must be combined with clinical observations, patient history, and epidemiological information.  Fact Sheet for Patients:   https://www.patel.info/  Fact Sheet for Healthcare Providers: https://hall.com/  This test is not yet approved or  cleared by the [place] FDA and has been authorized for detection and/or diagnosis of SARS-CoV-2 by FDA under an Emergency Use Authorization (EUA).  This EUA will remain in effect (meaning this test can be used) for the duration of the COVID-19 declaration under Section 564(b)(1) of the Act, 21 U.S.C. section 360bbb-3(b)(1), unless the authorization is terminated or revoked sooner.  Performed at [facility], [address], [place], [place] [address]   Urine Culture     Status: Abnormal   Collection Time: 11/23/21  2:00 PM   Specimen: Urine, Clean Catch  Result Value Ref Range  Status   Specimen Description   Final    URINE, CLEAN CATCH Performed at [facility], [address] [place], [place] [address]    Special Requests   Final    NONE Performed at [facility], [address] [place], [place] [address]    Culture (A)  Final    <10,000 COLONIES/mL INSIGNIFICANT GROWTH Performed at [facility] [address], [place], [place] [address]    Report Status 11/24/2021 FINAL  Final  Culture, blood (Routine x 2)     Status: None   Collection Time: 11/25/21  9:40 AM   Specimen: BLOOD LEFT FOREARM  Result Value Ref Range Status   Specimen Description   Final    BLOOD LEFT FOREARM Performed at [facility], [address], [place], [place] [address]    Special Requests   Final    BOTTLES DRAWN AEROBIC ONLY Blood Culture results may not be optimal due to an inadequate volume of blood received in culture bottles Performed at [facility], [address], [place], [place] [address]    Culture   Final    NO GROWTH 5 DAYS Performed at [facility], [place] [address], [place], [place] [address]    Report Status 11/30/2021 FINAL  Final  Resp Panel by RT-PCR (Flu A&B, Covid) Anterior Nasal Swab     Status: None   Collection  Time: 11/25/21  9:50 AM   Specimen: Anterior Nasal Swab  Result Value Ref Range Status   SARS Coronavirus 2 by RT PCR NEGATIVE NEGATIVE Final    Comment: (NOTE) SARS-CoV-2 target nucleic acids are NOT DETECTED.  The SARS-CoV-2 RNA is generally detectable in upper respiratory specimens during the acute phase of infection. The lowest concentration of SARS-CoV-2 viral copies this assay can detect is 138 copies/mL. A negative result does not preclude SARS-Cov-2 infection and should not be used as the sole basis for treatment or other patient management decisions. A negative result may occur with  improper specimen collection/handling, submission of  specimen other than nasopharyngeal swab, presence of viral mutation(s) within the areas targeted by this assay, and inadequate number of viral copies(<138 copies/mL). A negative result must be combined with clinical observations, patient history, and epidemiological information. The expected result is Negative.  Fact Sheet for Patients:  EntrepreneurPulse.com.au  Fact Sheet for Healthcare Providers:  IncredibleEmployment.be  This test is no t yet approved or cleared by the [place] FDA and  has been authorized for detection and/or diagnosis of SARS-CoV-2 by FDA under an Emergency Use Authorization (EUA). This EUA will remain  in effect (meaning this test can be used) for the duration of the COVID-19 declaration under Section 564(b)(1) of the Act, 21 U.S.C.section 360bbb-3(b)(1), unless the authorization is terminated  or revoked sooner.       Influenza A by PCR NEGATIVE NEGATIVE Final   Influenza B by PCR NEGATIVE NEGATIVE Final    Comment: (NOTE) The Xpert Xpress SARS-CoV-2/FLU/RSV plus assay is intended as an aid in the diagnosis of influenza from Nasopharyngeal swab specimens and should not be used as a sole basis for treatment. Nasal washings and aspirates are unacceptable for Xpert Xpress SARS-CoV-2/FLU/RSV testing.  Fact Sheet for Patients: EntrepreneurPulse.com.au  Fact Sheet for Healthcare Providers: IncredibleEmployment.be  This test is not yet approved or cleared by the [place] FDA and has been authorized for detection and/or diagnosis of SARS-CoV-2 by FDA under an Emergency Use Authorization (EUA). This EUA will remain in effect (meaning this test can be used) for the duration of the COVID-19 declaration under Section 564(b)(1) of the Act, 21 U.S.C. section 360bbb-3(b)(1), unless the authorization is terminated or revoked.  Performed at [facility], [address] [place], [place] [address]   Urine Culture     Status: None   Collection Time: 11/26/21  9:50 PM   Specimen: Urine, Clean Catch  Result Value Ref Range Status   Specimen Description   Final    URINE, CLEAN CATCH Performed at [facility], [place] [address] [place], [place] [address]    Special Requests   Final    NONE Performed at [facility], [place] [address], [place], [place] [address]    Culture   Final    NO GROWTH Performed at [facility], [place] [address] [place],  [address]    Report Status 11/27/2021 FINAL  Final    Radiology Studies: No results found.  Scheduled Meds:  amLODipine  10 mg Oral Daily   dexamethasone  2 mg Oral BID   guaiFENesin  1,200 mg Oral BID   ipratropium-albuterol  3 mL Nebulization BID   lacosamide  200 mg Oral BID   levETIRAcetam  1,500 mg Oral BID   lisinopril  20 mg Oral Daily   pantoprazole  40 mg Oral Daily   perampanel  4 mg Oral QHS  polyethylene glycol  17 g Oral BID   senna-docusate  1 tablet Oral BID   Continuous Infusions:   LOS: 7 days   Raiford Noble, DO [facility] Available via Epic secure chat 7am-7pm After these hours, please refer to coverage provider listed on [email or web address] 12/02/2021, 12:13 PM

## 2021-12-02 NOTE — Progress Notes (Signed)
Inpatient Rehab Admissions Coordinator:  Received a denial from [facility]. A peer-to-peer is being offered. Dr. Alfredia Ferguson is aware. He was unable to do peer-to-peer today d/t office being closed. He will f/u tomorrow. Called pt's husband [name] to update him about status of insurance authorization. Will continue to follow.   Gayland Curry, [place], [place] Admissions Coordinator [phone]

## 2021-12-02 NOTE — Telephone Encounter (Signed)
Received message from hospitalist.  She has Glioblastoma and cavitary lung nodule.  Please schedule first available appointment with Dr. Lamonte Sakai, Dr. Valeta Harms, or Dr. Verlee Monte.

## 2021-12-03 DIAGNOSIS — R569 Unspecified convulsions: Secondary | ICD-10-CM | POA: Diagnosis not present

## 2021-12-03 DIAGNOSIS — C719 Malignant neoplasm of brain, unspecified: Secondary | ICD-10-CM | POA: Diagnosis not present

## 2021-12-03 DIAGNOSIS — D696 Thrombocytopenia, unspecified: Secondary | ICD-10-CM

## 2021-12-03 DIAGNOSIS — J9601 Acute respiratory failure with hypoxia: Secondary | ICD-10-CM | POA: Diagnosis not present

## 2021-12-03 DIAGNOSIS — R531 Weakness: Secondary | ICD-10-CM | POA: Diagnosis not present

## 2021-12-03 DIAGNOSIS — R4701 Aphasia: Secondary | ICD-10-CM | POA: Diagnosis not present

## 2021-12-03 DIAGNOSIS — E782 Mixed hyperlipidemia: Secondary | ICD-10-CM | POA: Diagnosis not present

## 2021-12-03 DIAGNOSIS — J189 Pneumonia, unspecified organism: Secondary | ICD-10-CM | POA: Diagnosis not present

## 2021-12-03 DIAGNOSIS — A419 Sepsis, unspecified organism: Secondary | ICD-10-CM | POA: Diagnosis not present

## 2021-12-03 LAB — CBC WITH DIFFERENTIAL/PLATELET
Abs Immature Granulocytes: 0.02 10*3/uL (ref 0.00–0.07)
Basophils Absolute: 0 10*3/uL (ref 0.0–0.1)
Basophils Relative: 0 %
Eosinophils Absolute: 0 10*3/uL (ref 0.0–0.5)
Eosinophils Relative: 0 %
HCT: 26.9 % — ABNORMAL LOW (ref 36.0–46.0)
Hemoglobin: 8.6 g/dL — ABNORMAL LOW (ref 12.0–15.0)
Immature Granulocytes: 1 %
Lymphocytes Relative: 6 %
Lymphs Abs: 0.2 10*3/uL — ABNORMAL LOW (ref 0.7–4.0)
MCH: 35 pg — ABNORMAL HIGH (ref 26.0–34.0)
MCHC: 32 g/dL (ref 30.0–36.0)
MCV: 109.3 fL — ABNORMAL HIGH (ref 80.0–100.0)
Monocytes Absolute: 0.1 10*3/uL (ref 0.1–1.0)
Monocytes Relative: 4 %
Neutro Abs: 3.6 10*3/uL (ref 1.7–7.7)
Neutrophils Relative %: 89 %
Platelets: 36 10*3/uL — ABNORMAL LOW (ref 150–400)
RBC: 2.46 MIL/uL — ABNORMAL LOW (ref 3.87–5.11)
RDW: 15.9 % — ABNORMAL HIGH (ref 11.5–15.5)
WBC: 4 10*3/uL (ref 4.0–10.5)
nRBC: 0 % (ref 0.0–0.2)

## 2021-12-03 LAB — COMPREHENSIVE METABOLIC PANEL
ALT: 47 U/L — ABNORMAL HIGH (ref 0–44)
AST: 31 U/L (ref 15–41)
Albumin: 2.3 g/dL — ABNORMAL LOW (ref 3.5–5.0)
Alkaline Phosphatase: 46 U/L (ref 38–126)
Anion gap: 5 (ref 5–15)
BUN: 29 mg/dL — ABNORMAL HIGH (ref 6–20)
CO2: 23 mmol/L (ref 22–32)
Calcium: 8.4 mg/dL — ABNORMAL LOW (ref 8.9–10.3)
Chloride: 114 mmol/L — ABNORMAL HIGH (ref 98–111)
Creatinine, Ser: 0.63 mg/dL (ref 0.44–1.00)
GFR, Estimated: >60 mL/min (ref >60)
Glucose, Bld: 128 mg/dL — ABNORMAL HIGH (ref 70–99)
Potassium: 4.4 mmol/L (ref 3.5–5.1)
Sodium: 142 mmol/L (ref 135–145)
Total Bilirubin: 0.5 mg/dL (ref 0.3–1.2)
Total Protein: 5 g/dL — ABNORMAL LOW (ref 6.5–8.1)

## 2021-12-03 LAB — PHOSPHORUS: Phosphorus: 3.8 mg/dL (ref 2.5–4.6)

## 2021-12-03 LAB — MAGNESIUM: Magnesium: 2.2 mg/dL (ref 1.7–2.4)

## 2021-12-03 MED ORDER — GUAIFENESIN ER 600 MG PO TB12: 600.0000 mg | ORAL_TABLET | Freq: Two times a day (BID) | ORAL | 0 refills | Status: AC

## 2021-12-03 MED ORDER — POLYETHYLENE GLYCOL 3350 17 G PO PACK: 17.0000 g | PACK | Freq: Two times a day (BID) | ORAL | 0 refills | Status: DC

## 2021-12-03 MED ORDER — IPRATROPIUM-ALBUTEROL 0.5-2.5 (3) MG/3ML IN SOLN: 3.0000 mL | RESPIRATORY_TRACT | Status: DC | PRN

## 2021-12-03 MED ORDER — ONDANSETRON HCL 4 MG PO TABS: 4.0000 mg | ORAL_TABLET | Freq: Four times a day (QID) | ORAL | 0 refills | Status: DC | PRN

## 2021-12-03 MED ORDER — SALINE SPRAY 0.65 % NA SOLN
1.0000 | NASAL | Status: DC | PRN
  Filled 2021-12-03: qty 44

## 2021-12-03 MED ORDER — ACETAMINOPHEN 325 MG PO TABS: 650.0000 mg | ORAL_TABLET | Freq: Four times a day (QID) | ORAL | 0 refills | Status: DC | PRN

## 2021-12-03 MED ORDER — SENNOSIDES-DOCUSATE SODIUM 8.6-50 MG PO TABS: 1.0000 | ORAL_TABLET | Freq: Every day | ORAL | 0 refills | Status: AC

## 2021-12-03 NOTE — Progress Notes (Signed)
Discharge instructions given to patient and all questions were answered.  

## 2021-12-03 NOTE — Discharge Summary (Signed)
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 minimal atelectasis or infiltrate at the left base, similar to prior. The cardiopericardial silhouette is within normal limits for size. Telemetry leads overlie the chest. IMPRESSION: 1. Slight interval increase in conspicuity of the nodular opacity in the peripheral left mid lung. 2. Persistent atelectasis or infiltrate at the left base. Electronically Signed   By: Misty [name] M.D.   On: 11/25/2021 11:10   CT CHEST WO CONTRAST  Result Date: 11/24/2021 CLINICAL DATA:  Follow-up for abnormal chest radiograph. Possible left mid lung nodule. EXAM: CT CHEST WITHOUT CONTRAST TECHNIQUE: Multidetector CT imaging of the chest was performed following the standard protocol without IV contrast. RADIATION DOSE REDUCTION: This exam was performed according to the departmental dose-optimization program which includes automated exposure control, adjustment of the mA and/or kV according to patient size and/or use of iterative reconstruction technique. COMPARISON:  Chest radiograph, 11/23/2021.  Chest CT, 06/02/2019. FINDINGS: Cardiovascular: Heart is normal in size and  configuration. No pericardial effusion. No coronary artery calcifications. Great vessels are normal in caliber. Mediastinum/Nodes: No enlarged mediastinal or axillary lymph nodes. Thyroid gland, trachea, and esophagus demonstrate no significant findings.  Lungs/Pleura: Central cavitary nodule, left upper lobe posteriorly and laterally, centered on image 63, series 5, with smaller contiguous nodules extending from its posterior margin toward the lateral aspect of the oblique fissure. Nodule measures 12 mm. Nodule and the adjacent posterior nodularity spans a total of 2.2 cm. This corresponds to the opacity noted on the current chest radiograph and is new from the prior chest CT. Small stable nodules, 3 mm nodule, right upper lobe, image 40, series 5, Peri fissural nodule, 4 mm, image 82, series 5 and right lower lobe nodule, 3-4 mm, image 84, series 5. Given the stability for over 2 years, these are benign. No other pulmonary nodules. Lungs demonstrate interstitial thickening, lower lung predominance, and basilar opacities in the lower lobes consistent with atelectasis. No pleural effusion.  No pneumothorax. Upper Abdomen: Unremarkable. Musculoskeletal: No fracture or acute finding.  No bone lesion. Small rounded mass in the right breast, 5-6 mm, benign and stable from mammograms dated 2019 in 2017. IMPRESSION: 1. 12 mm nodule in the left upper lobe corresponding to the chest radiographic abnormality. This has central cavitation. Prior chest radiographic history reports fever and history of glioblastoma. Since this nodule may be inflammatory, recommend short-term follow-up unenhanced chest CT in 2-3 months to reassess. 2. Bilateral interstitial thickening, mostly in the lower lungs, which is new from the prior CT. Consider interstitial edema if the patient has consistent clinical symptoms. 3. Small stable nodules in the right lung, benign with no follow-up recommended. Electronically Signed   By: Lajean Manes M.D.    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  EEG to evaluate for seizure Level of alertness: Awake, asleep AEDs during EEG study: LEV, LCM, Fycompa Technical aspects: This EEG study was done with scalp electrodes positioned according to the 10-20 International system of electrode placement. Electrical activity was reviewed with band pass filter of 1-'70Hz'$ , sensitivity of 7 uV/mm, display speed of 21m/sec with a '60Hz'$  notched filter applied as appropriate. EEG data were recorded continuously and digitally stored.  Video monitoring was available and reviewed as appropriate. Description: The posterior dominant rhythm consists of 8 Hz activity of moderate voltage (25-35 uV) seen predominantly in posterior head regions, asymmetric( left<right) and reactive to eye opening and eye closing. Sleep was characterized by vertex waves, sleep spindles (12 to 14 Hz), maximal frontocentral region. EEG showed continuous 3 to 6 Hz theta-delta slowing in left hemisphere, maximal left temporal region. Hyperventilation and photic stimulation were not performed.   ABNORMALITY - Continuous slow, left hemisphere, maximal left temporal region - Background asymmetry, left<right IMPRESSION: This study is suggestive of cortical dysfunction arising from left hemisphere, maximal left temporal region likely secondary to underlying structural abnormality. No seizures or epileptiform discharges were seen throughout the recording. Priyanka [name]  [place]ous Img Lower Unilateral  Right  Result Date: 11/23/2021 CLINICAL DATA:  Pain right calf EXAM: Right LOWER EXTREMITY VENOUS DOPPLER ULTRASOUND TECHNIQUE: Gray-scale sonography with compression, as well as color and duplex ultrasound, were performed to evaluate the deep venous system(s) from the level of the common femoral vein through the popliteal and proximal  calf veins. COMPARISON:  None Available. FINDINGS: VENOUS Normal compressibility of the common femoral, superficial femoral, and popliteal veins, as well as the visualized calf veins. Visualized portions of profunda femoral vein and great saphenous vein unremarkable. No filling defects to suggest DVT on grayscale or color Doppler imaging. Doppler waveforms show normal direction of venous flow, normal respiratory plasticity and response to augmentation. Limited views of the contralateral common femoral vein are unremarkable. OTHER None. Limitations: none IMPRESSION: There is no evidence of deep venous thrombosis in right lower extremity. Electronically Signed   By: Elmer Picker M.D.   On: 11/23/2021 12:51   DG Chest Portable 1 View  Result Date: 11/23/2021 CLINICAL DATA:  Fever.  History of glioblastoma. EXAM: PORTABLE CHEST 1 VIEW COMPARISON:  None Available. FINDINGS: The heart size and mediastinal contours are within normal limits. Mild bibasilar subsegmental atelectasis or scarring is noted. Irregular nodular density is noted laterally in left midlung concerning for pulmonary nodule or metastatic disease. The visualized skeletal structures are unremarkable. IMPRESSION: Irregular nodular density seen laterally in the left midlung concerning for metastatic disease. CT scan of the chest is recommended for further evaluation. Electronically Signed   By: Marijo Conception M.D.   On: 11/23/2021 11:49   MR Brain W and Wo Contrast  Result Date: 11/23/2021 CLINICAL DATA:  Neuro deficit, acute, stroke suspected glioblastoma, focal seizures, worsening aphasia EXAM: MRI HEAD  WITHOUT AND WITH CONTRAST TECHNIQUE: Multiplanar, multiecho pulse sequences of the brain and surrounding structures were obtained without and with intravenous contrast. CONTRAST:  4.49m GADAVIST GADOBUTROL 1 MMOL/ML IV SOLN COMPARISON:  March 2021 FINDINGS: Brain: There is a heterogeneous T2 hyperintense mass of the high left frontal lobe measuring about 4.5 x 3.1 x 5 cm. There is enhancement at the periphery. Surrounding T2 FLAIR hyperintensity with involvement of the body of the corpus callosum. There is also some T2 FLAIR hyperintensity along the more posterior corpus callosum and splenium. Decreased T2 hyperintensity in the insula and temporal lobe compared to the remote available study. Mass effect is minor. No acute infarction or hemorrhage. No hydrocephalus or extra-axial collection. Vascular: Major vessel flow voids at the skull base are preserved. Skull and upper cervical spine: Normal marrow signal is preserved. Sinuses/Orbits: Paranasal sinuses are aerated. Orbits are unremarkable. Other: Sella is unremarkable.  Mastoid air cells are clear. IMPRESSION: No recent prior studies are available for comparison. Left frontal lobe mass and surrounding edema/infiltrating tumor. Paucity of enhancement may reflect bevacizumab treatment. There is no hemorrhage or significant mass effect. Electronically Signed   By: PMacy MisM.D.   On: 11/23/2021 11:13   CT HEAD WO CONTRAST  Result Date: 11/23/2021 CLINICAL DATA:  History of glioblastoma with speech issues and right-sided weakness. EXAM: CT HEAD WITHOUT CONTRAST TECHNIQUE: Contiguous axial images were obtained from the base of the skull through the vertex without intravenous contrast. RADIATION DOSE REDUCTION: This exam was performed according to the departmental dose-optimization program which includes automated exposure control, adjustment of the mA and/or kV according to patient size and/or use of iterative reconstruction technique. COMPARISON:  Brain MRI  07/05/2019 FINDINGS: Brain: There is edema in the left frontal lobe predominantly in the superior and middle frontal gyri. Some of the overlying cortex appears involved while other areas appears spared (for example 4-39, 4-36). There is no associated hemorrhage. This is distinct from the region of signal abnormality in the brain MRI from 2021. There is regional sulcal effacement with partial effacement of the left lateral ventricle  but no midline shift. There is no acute intracranial hemorrhage or extra-axial fluid collection. Background parenchymal volume is normal. The ventricles are otherwise normal in size. Vascular: No hyperdense vessel or unexpected calcification. Skull: Normal. Negative for fracture or focal lesion. Sinuses/Orbits: Imaged paranasal sinuses are clear. The globes and orbits are unremarkable. Other: None. IMPRESSION: 1. Predominantly vasogenic appearing edema in the left frontal lobe raise concern for underlying mass lesion especially given the history of glioma, though ischemia remains a possibility. This area is separate from the region of signal abnormality seen on the brain MRI from 2021. Recommend brain MRI with and without contrast for further evaluation. 2. No acute intracranial hemorrhage. Electronically Signed   By: Valetta Mole M.D.   On: 11/23/2021 09:54    Microbiology: Results for orders placed or performed during the hospital encounter of 11/25/21  Culture, blood (Routine x 2)     Status: None   Collection Time: 11/25/21  9:40 AM   Specimen: BLOOD LEFT FOREARM  Result Value Ref Range Status   Specimen Description   Final    BLOOD LEFT FOREARM Performed at [facility], [address], [place], [place] [address]    Special Requests   Final    BOTTLES DRAWN AEROBIC ONLY Blood Culture results may not be optimal due to an inadequate volume of blood received in culture bottles Performed at [facility], [address]  [place], [place] [address]    Culture   Final    NO GROWTH 5 DAYS Performed at [facility], [place] [address], [place], [place] [address]    Report Status 11/30/2021 FINAL  Final  Resp Panel by RT-PCR (Flu A&B, Covid) Anterior Nasal Swab     Status: None   Collection Time: 11/25/21  9:50 AM   Specimen: Anterior Nasal Swab  Result Value Ref Range Status   SARS Coronavirus 2 by RT PCR NEGATIVE NEGATIVE Final    Comment: (NOTE) SARS-CoV-2 target nucleic acids are NOT DETECTED.  The SARS-CoV-2 RNA is generally detectable in upper respiratory specimens during the acute phase of infection. The lowest concentration of SARS-CoV-2 viral copies this assay can detect is 138 copies/mL. A negative result does not preclude SARS-Cov-2 infection and should not be used as the sole basis for treatment or other patient management decisions. A negative result may occur with  improper specimen collection/handling, submission of specimen other than nasopharyngeal swab, presence of viral mutation(s) within the areas targeted by this assay, and inadequate number of viral copies(<138 copies/mL). A negative result must be combined with clinical observations, patient history, and epidemiological information. The expected result is Negative.  Fact Sheet for Patients:  EntrepreneurPulse.com.au  Fact Sheet for Healthcare Providers:  IncredibleEmployment.be  This test is no t yet approved or cleared by the [place] FDA and  has been authorized for detection and/or diagnosis of SARS-CoV-2 by FDA under an Emergency Use Authorization (EUA). This EUA will remain  in effect (meaning this test can be used) for the duration of the COVID-19 declaration under Section 564(b)(1) of the Act, 21 U.S.C.section 360bbb-3(b)(1), unless the authorization is terminated  or revoked sooner.       Influenza A by PCR NEGATIVE NEGATIVE Final   Influenza B by PCR NEGATIVE NEGATIVE Final     Comment: (NOTE) The Xpert Xpress SARS-CoV-2/FLU/RSV plus assay is intended as an aid in the diagnosis of influenza from Nasopharyngeal swab specimens and should not be used as a sole basis for treatment. Nasal  washings and aspirates are unacceptable for Xpert Xpress SARS-CoV-2/FLU/RSV testing.  Fact Sheet for Patients: EntrepreneurPulse.com.au  Fact Sheet for Healthcare Providers: IncredibleEmployment.be  This test is not yet approved or cleared by the [place] FDA and has been authorized for detection and/or diagnosis of SARS-CoV-2 by FDA under an Emergency Use Authorization (EUA). This EUA will remain in effect (meaning this test can be used) for the duration of the COVID-19 declaration under Section 564(b)(1) of the Act, 21 U.S.C. section 360bbb-3(b)(1), unless the authorization is terminated or revoked.  Performed at [facility], [address] [place], [place] [address]   Urine Culture     Status: None   Collection Time: 11/26/21  9:50 PM   Specimen: Urine, Clean Catch  Result Value Ref Range Status   Specimen Description   Final    URINE, CLEAN CATCH Performed at [facility], [place] [address], [place], [place] [address]    Special Requests   Final    NONE Performed at [facility], [place] [address] [place], [place] [address]    Culture   Final    NO GROWTH Performed at [facility], [place] [address] [place], [place] [address]    Report Status 11/27/2021 FINAL  Final   Labs: CBC: Recent Labs  Lab 11/29/21 0932 11/30/21 0436 12/01/21 0751 12/02/21 0503 12/03/21 0412  WBC 4.9 5.2 4.3 4.1 4.0  NEUTROABS 4.2 4.9 3.9 3.7 3.6  HGB 11.0* 9.1* 9.7* 8.9* 8.6*  HCT 32.1* 27.9* 30.2* 28.0* 26.9*  MCV 104.6* 109.0* 110.2* 112.4* 109.3*  PLT 67* 52* 44* 38* 36*   Basic Metabolic Panel: Recent Labs  Lab 11/29/21 0932 11/30/21 0436 12/01/21 0751 12/02/21 0503  12/03/21 0412  NA 141 141 143 142 142  K 3.8 4.5 4.4 4.2 4.4  CL 108 109 110 111 114*  CO2 '26 26 26 24 23  '$ GLUCOSE 114* 117* 90 98 128*  BUN '15 18 19 '$ 25* 29*  CREATININE 0.63 0.69 0.58 0.61 0.63  CALCIUM 8.1* 7.8* 8.3* 8.2* 8.4*  MG 1.9 2.1 2.3 2.3 2.2  PHOS 3.2 3.2 4.2 3.7 3.8   Liver Function Tests: Recent Labs  Lab 11/29/21 0932 11/30/21 0436 12/01/21 0751 12/02/21 0503 12/03/21 0412  AST 44* 58* 60* 39 31  ALT 24 48* 60* 53* 47*  ALKPHOS 44 44 45 43 46  BILITOT 0.5 0.5 0.6 0.6 0.5  PROT 5.4* 4.7* 5.1* 4.9* 5.0*  ALBUMIN 2.3* 2.2* 2.4* 2.3* 2.3*   CBG: No results for input(s): "GLUCAP" in the last 168 hours.  Discharge time spent: greater than 30 minutes.  Signed: Raiford Noble, DO [facility] 12/03/2021

## 2021-12-03 NOTE — Progress Notes (Addendum)
Inpatient Rehab Admissions Coordinator:  Peer-to-peer complete. Denial upheld. TOC aware. Attempted to contact pt's husband [name] to update him. No one answered the phone and not able to leave a message.  ADDENDUM 1350: Chris called AC. Updated him about denial. He would like pt to go home with [facility].    Gayland Curry, [place], [place] Admissions Coordinator [phone]

## 2021-12-03 NOTE — TOC Transition Note (Signed)
Transition of Care [facility]) - CM/SW Discharge Note   Patient Details  Name: Kristen Foley MRN: [id number] Date of Birth: [birth year]  Transition of Care [facility]) CM/SW Contact:  Lennart Pall, LCSW Phone Number: 12/03/2021, 2:09 PM   Clinical Narrative:    Discussed with pt and spouse the denial following peer-to-peer for CIR.  They understand and now planning on dc home with [facility] follow up.  Both eager to dc home today.  Reviewed DME and HH needs - no agency preferences.  Lightweight w/c and bedside commode ordered via [place] for delivery to room.  HHPT/OT/aide arranged with [facility].  No further TOC needs.   Final next level of care: [place] Barriers to Discharge: Barriers Resolved   Patient Goals and CMS Choice Patient states their goals for this hospitalization and ongoing recovery are:: return home      Discharge Placement                       Discharge Plan and Services     Post Acute Care Choice: IP Rehab          DME Arranged: 3-N-1, Lightweight manual wheelchair with seat cushion DME Agency: [employer] Date DME Agency Contacted: 12/03/21 Time DME Agency Contacted: [id number] Representative spoke with at DME Agency: [place]: PT, OT, Nurse's Aide [place] Agency: [place] Date [place]: 12/03/21 Time [place]: [place] Representative spoke with at [place]: [place] ([place]) Interventions     Readmission Risk Interventions    11/30/2021   12:57 PM  Readmission Risk Prevention Plan  Post Dischage Appt Complete  Medication Screening Complete  Transportation Screening Complete

## 2021-12-03 NOTE — Progress Notes (Signed)
Physical Therapy Treatment Patient Details Name: Kristen Foley MRN: [id number] DOB: 12/31/1965 Today's Date: 12/03/2021   History of Present Illness 56 year old female with a history of glioblastoma, R hemiparesis, expressive aphasia, seizures, admitted to the hospital with altered mental status and high fever. Dx of sepsis, PNA.    PT Comments    Pt progressing well with PT today. Husband present for session. Would continue to recommend CIR however ins denied. Pt will need OPPT f/u of this is doable for pt  family (if not HHPT). Husband was able to provide necessary assist this session, pt goes to work with him so she can be supervised.    Recommendations for follow up therapy are one component of a multi-disciplinary discharge planning process, led by the attending physician.  Recommendations may be updated based on patient status, additional functional criteria and insurance authorization.  Follow Up Recommendations  Outpatient PT (ins denied CIR)     Assistance Recommended at Discharge Frequent or constant Supervision/Assistance  Patient can return home with the following A lot of help with walking and/or transfers;A lot of help with bathing/dressing/bathroom;Assistance with cooking/housework;Direct supervision/assist for medications management;Direct supervision/assist for financial management;Assist for transportation;Help with stairs or ramp for entrance   Equipment Recommendations  Wheelchair (measurements PT);Wheelchair cushion (measurements PT)    Recommendations for Other Services       Precautions / Restrictions Precautions Precautions: Fall Precaution Comments: most recent fall was in July 2* seizure per spouse; expressive aphasia, R HP Restrictions Weight Bearing Restrictions: No     Mobility  Bed Mobility               General bed mobility comments: in recliner    Transfers Overall transfer level: Needs assistance Equipment used: Rolling walker (2  wheels) Transfers: Sit to/from Stand Sit to Stand: Min assist           General transfer comment: assist to rise and steady    Ambulation/Gait Ambulation/Gait assistance: Min assist, Mod assist Gait Distance (Feet): 160 Feet Assistive device: Rolling walker (2 wheels), 1 person hand held assist Gait Pattern/deviations: Step-to pattern, Decreased step length - right, Narrow base of support, Decreased weight shift to right, Decreased dorsiflexion - right       General Gait Details: no assist needed to advance RLE however cues to incr df and for step length as well as to widen BOS. pt unable to maintain grasp R ahdn on RW, therefore provided HHA. pt husband able to amb pt with HHA last 66' without difficulty.   Stairs             Wheelchair Mobility    Modified Rankin (Stroke Patients Only)       Balance Overall balance assessment: Needs assistance Sitting-balance support: No upper extremity supported, Feet supported Sitting balance-Leahy Scale: Fair     Standing balance support: Bilateral upper extremity supported, Single extremity supported Standing balance-Leahy Scale: Fair Standing balance comment: with wider BOS pt able to maintain static stand with close sueprvision                            Cognition Arousal/Alertness: Awake/alert Behavior During Therapy: WFL for tasks assessed/performed Overall Cognitive Status:  (at baseline per husband)                         Following Commands: Follows one step commands consistently, Follows one step commands with increased time  Exercises General Exercises - Lower Extremity Ankle Circles/Pumps: Right, 10 reps, Supine, AAROM Long Arc Quad: AROM, 10 reps, Right, Seated    General Comments        Pertinent Vitals/Pain Pain Assessment Pain Assessment: No/denies pain    Home Living                          Prior Function            PT Goals (current  goals can now be found in the care plan section) Acute Rehab PT Goals Patient Stated Goal: care for horses PT Goal Formulation: With family Time For Goal Achievement: 12/12/21 Potential to Achieve Goals: Fair Progress towards PT goals: Progressing toward goals    Frequency    Min 3X/week      PT Plan Discharge plan needs to be updated    Co-evaluation              AM-PAC PT "6 Clicks" Mobility   Outcome Measure  Help needed turning from your back to your side while in a flat bed without using bedrails?: A Lot Help needed moving from lying on your back to sitting on the side of a flat bed without using bedrails?: A Lot Help needed moving to and from a bed to a chair (including a wheelchair)?: A Lot Help needed standing up from a chair using your arms (e.g., wheelchair or bedside chair)?: A Lot Help needed to walk in hospital room?: A Lot Help needed climbing 3-5 steps with a railing? : A Lot 6 Click Score: 12    End of Session Equipment Utilized During Treatment: Gait belt Activity Tolerance: Patient tolerated treatment well Patient left: in chair;with call bell/phone within reach;with family/visitor present Nurse Communication: Mobility status PT Visit Diagnosis: Unsteadiness on feet (R26.81);Difficulty in walking, not elsewhere classified (R26.2);Other abnormalities of gait and mobility (R26.89)     Time: 1040-1059 PT Time Calculation (min) (ACUTE ONLY): 19 min  Charges:  $Gait Training: 8-22 mins                     Baxter Flattery, PT  Acute Rehab Dept [facility]) [phone]  [facility] Weekend Pager [facility] only)  [phone]  12/03/2021    [facility] 12/03/2021, 3:00 PM

## 2021-12-03 NOTE — Progress Notes (Signed)
RT changed Breathing TX changed due to patients assessment which scored a 4. Patient's breath sounds are clear and slightly diminished in the lower lobes.

## 2021-12-06 DIAGNOSIS — C719 Malignant neoplasm of brain, unspecified: Secondary | ICD-10-CM | POA: Diagnosis not present

## 2021-12-06 DIAGNOSIS — R4701 Aphasia: Secondary | ICD-10-CM | POA: Diagnosis not present

## 2021-12-06 DIAGNOSIS — G40909 Epilepsy, unspecified, not intractable, without status epilepticus: Secondary | ICD-10-CM | POA: Diagnosis not present

## 2021-12-06 DIAGNOSIS — E559 Vitamin D deficiency, unspecified: Secondary | ICD-10-CM | POA: Diagnosis not present

## 2021-12-06 DIAGNOSIS — Z7952 Long term (current) use of systemic steroids: Secondary | ICD-10-CM | POA: Diagnosis not present

## 2021-12-06 DIAGNOSIS — G8191 Hemiplegia, unspecified affecting right dominant side: Secondary | ICD-10-CM | POA: Diagnosis not present

## 2021-12-06 DIAGNOSIS — J189 Pneumonia, unspecified organism: Secondary | ICD-10-CM | POA: Diagnosis not present

## 2021-12-06 DIAGNOSIS — D696 Thrombocytopenia, unspecified: Secondary | ICD-10-CM | POA: Diagnosis not present

## 2021-12-06 DIAGNOSIS — A419 Sepsis, unspecified organism: Secondary | ICD-10-CM | POA: Diagnosis not present

## 2021-12-06 DIAGNOSIS — E782 Mixed hyperlipidemia: Secondary | ICD-10-CM | POA: Diagnosis not present

## 2021-12-06 DIAGNOSIS — Z9181 History of falling: Secondary | ICD-10-CM | POA: Diagnosis not present

## 2021-12-06 DIAGNOSIS — G9341 Metabolic encephalopathy: Secondary | ICD-10-CM | POA: Diagnosis not present

## 2021-12-06 DIAGNOSIS — I10 Essential (primary) hypertension: Secondary | ICD-10-CM | POA: Diagnosis not present

## 2021-12-06 DIAGNOSIS — D630 Anemia in neoplastic disease: Secondary | ICD-10-CM | POA: Diagnosis not present

## 2021-12-06 DIAGNOSIS — R918 Other nonspecific abnormal finding of lung field: Secondary | ICD-10-CM | POA: Diagnosis not present

## 2021-12-07 ENCOUNTER — Telehealth: Payer: Self-pay | Admitting: Nurse Practitioner

## 2021-12-07 ENCOUNTER — Other Ambulatory Visit: Payer: Self-pay | Admitting: Nurse Practitioner

## 2021-12-07 MED ORDER — NYSTATIN 100000 UNIT/ML MT SUSP: 5.0000 mL | Freq: Four times a day (QID) | OROMUCOSAL | 1 refills | Status: DC

## 2021-12-07 NOTE — Telephone Encounter (Signed)
Patient was just in the hospital for 8 days. Patient's husband [name], is calling on her behalf. She has developed 2 small red sores on her tongue as well as pencil eraser sized spots on her tongue that resemble cottage cheese. He is thinking that she might have thrush and is wondering if there is anything he can do or if anything can be called in to resolve it.

## 2021-12-07 NOTE — Telephone Encounter (Signed)
I sent Nystatin swish and spit 4 times a day to [facility]

## 2021-12-11 DIAGNOSIS — G40909 Epilepsy, unspecified, not intractable, without status epilepticus: Secondary | ICD-10-CM | POA: Diagnosis not present

## 2021-12-11 DIAGNOSIS — A419 Sepsis, unspecified organism: Secondary | ICD-10-CM | POA: Diagnosis not present

## 2021-12-11 DIAGNOSIS — I10 Essential (primary) hypertension: Secondary | ICD-10-CM | POA: Diagnosis not present

## 2021-12-11 DIAGNOSIS — C711 Malignant neoplasm of frontal lobe: Secondary | ICD-10-CM | POA: Diagnosis not present

## 2021-12-12 ENCOUNTER — Telehealth: Payer: Self-pay

## 2021-12-12 DIAGNOSIS — A419 Sepsis, unspecified organism: Secondary | ICD-10-CM | POA: Diagnosis not present

## 2021-12-12 NOTE — Telephone Encounter (Signed)
Mailbox is full, unable to reach patient.

## 2021-12-14 DIAGNOSIS — C719 Malignant neoplasm of brain, unspecified: Secondary | ICD-10-CM | POA: Diagnosis not present

## 2021-12-14 DIAGNOSIS — J189 Pneumonia, unspecified organism: Secondary | ICD-10-CM | POA: Diagnosis not present

## 2021-12-14 DIAGNOSIS — R918 Other nonspecific abnormal finding of lung field: Secondary | ICD-10-CM | POA: Diagnosis not present

## 2021-12-14 DIAGNOSIS — I10 Essential (primary) hypertension: Secondary | ICD-10-CM | POA: Diagnosis not present

## 2021-12-14 DIAGNOSIS — R4701 Aphasia: Secondary | ICD-10-CM | POA: Diagnosis not present

## 2021-12-14 DIAGNOSIS — E559 Vitamin D deficiency, unspecified: Secondary | ICD-10-CM | POA: Diagnosis not present

## 2021-12-14 DIAGNOSIS — Z9181 History of falling: Secondary | ICD-10-CM | POA: Diagnosis not present

## 2021-12-14 DIAGNOSIS — G40909 Epilepsy, unspecified, not intractable, without status epilepticus: Secondary | ICD-10-CM | POA: Diagnosis not present

## 2021-12-14 DIAGNOSIS — E782 Mixed hyperlipidemia: Secondary | ICD-10-CM | POA: Diagnosis not present

## 2021-12-14 DIAGNOSIS — Z7952 Long term (current) use of systemic steroids: Secondary | ICD-10-CM | POA: Diagnosis not present

## 2021-12-14 DIAGNOSIS — D630 Anemia in neoplastic disease: Secondary | ICD-10-CM | POA: Diagnosis not present

## 2021-12-14 DIAGNOSIS — D696 Thrombocytopenia, unspecified: Secondary | ICD-10-CM | POA: Diagnosis not present

## 2021-12-14 DIAGNOSIS — G8191 Hemiplegia, unspecified affecting right dominant side: Secondary | ICD-10-CM | POA: Diagnosis not present

## 2021-12-14 DIAGNOSIS — G9341 Metabolic encephalopathy: Secondary | ICD-10-CM | POA: Diagnosis not present

## 2021-12-17 DIAGNOSIS — J189 Pneumonia, unspecified organism: Secondary | ICD-10-CM | POA: Diagnosis not present

## 2021-12-17 DIAGNOSIS — E559 Vitamin D deficiency, unspecified: Secondary | ICD-10-CM | POA: Diagnosis not present

## 2021-12-17 DIAGNOSIS — G8191 Hemiplegia, unspecified affecting right dominant side: Secondary | ICD-10-CM | POA: Diagnosis not present

## 2021-12-17 DIAGNOSIS — C719 Malignant neoplasm of brain, unspecified: Secondary | ICD-10-CM | POA: Diagnosis not present

## 2021-12-17 DIAGNOSIS — Z7952 Long term (current) use of systemic steroids: Secondary | ICD-10-CM | POA: Diagnosis not present

## 2021-12-17 DIAGNOSIS — G40909 Epilepsy, unspecified, not intractable, without status epilepticus: Secondary | ICD-10-CM | POA: Diagnosis not present

## 2021-12-17 DIAGNOSIS — R4701 Aphasia: Secondary | ICD-10-CM | POA: Diagnosis not present

## 2021-12-17 DIAGNOSIS — E782 Mixed hyperlipidemia: Secondary | ICD-10-CM | POA: Diagnosis not present

## 2021-12-17 DIAGNOSIS — G9341 Metabolic encephalopathy: Secondary | ICD-10-CM | POA: Diagnosis not present

## 2021-12-17 DIAGNOSIS — Z9181 History of falling: Secondary | ICD-10-CM | POA: Diagnosis not present

## 2021-12-17 DIAGNOSIS — D696 Thrombocytopenia, unspecified: Secondary | ICD-10-CM | POA: Diagnosis not present

## 2021-12-17 DIAGNOSIS — D630 Anemia in neoplastic disease: Secondary | ICD-10-CM | POA: Diagnosis not present

## 2021-12-17 DIAGNOSIS — I10 Essential (primary) hypertension: Secondary | ICD-10-CM | POA: Diagnosis not present

## 2021-12-17 DIAGNOSIS — R918 Other nonspecific abnormal finding of lung field: Secondary | ICD-10-CM | POA: Diagnosis not present

## 2021-12-18 DIAGNOSIS — D696 Thrombocytopenia, unspecified: Secondary | ICD-10-CM | POA: Diagnosis not present

## 2021-12-18 DIAGNOSIS — E559 Vitamin D deficiency, unspecified: Secondary | ICD-10-CM | POA: Diagnosis not present

## 2021-12-18 DIAGNOSIS — G8191 Hemiplegia, unspecified affecting right dominant side: Secondary | ICD-10-CM | POA: Diagnosis not present

## 2021-12-18 DIAGNOSIS — E782 Mixed hyperlipidemia: Secondary | ICD-10-CM | POA: Diagnosis not present

## 2021-12-18 DIAGNOSIS — Z9181 History of falling: Secondary | ICD-10-CM | POA: Diagnosis not present

## 2021-12-18 DIAGNOSIS — J189 Pneumonia, unspecified organism: Secondary | ICD-10-CM | POA: Diagnosis not present

## 2021-12-18 DIAGNOSIS — Z7952 Long term (current) use of systemic steroids: Secondary | ICD-10-CM | POA: Diagnosis not present

## 2021-12-18 DIAGNOSIS — I10 Essential (primary) hypertension: Secondary | ICD-10-CM | POA: Diagnosis not present

## 2021-12-18 DIAGNOSIS — C719 Malignant neoplasm of brain, unspecified: Secondary | ICD-10-CM | POA: Diagnosis not present

## 2021-12-18 DIAGNOSIS — D630 Anemia in neoplastic disease: Secondary | ICD-10-CM | POA: Diagnosis not present

## 2021-12-18 DIAGNOSIS — R918 Other nonspecific abnormal finding of lung field: Secondary | ICD-10-CM | POA: Diagnosis not present

## 2021-12-18 DIAGNOSIS — G9341 Metabolic encephalopathy: Secondary | ICD-10-CM | POA: Diagnosis not present

## 2021-12-18 DIAGNOSIS — R4701 Aphasia: Secondary | ICD-10-CM | POA: Diagnosis not present

## 2021-12-18 DIAGNOSIS — G40909 Epilepsy, unspecified, not intractable, without status epilepticus: Secondary | ICD-10-CM | POA: Diagnosis not present

## 2021-12-19 ENCOUNTER — Ambulatory Visit: Payer: 59 | Admitting: Pulmonary Disease

## 2021-12-19 DIAGNOSIS — R4701 Aphasia: Secondary | ICD-10-CM | POA: Diagnosis not present

## 2021-12-19 DIAGNOSIS — R918 Other nonspecific abnormal finding of lung field: Secondary | ICD-10-CM | POA: Diagnosis not present

## 2021-12-19 DIAGNOSIS — G40909 Epilepsy, unspecified, not intractable, without status epilepticus: Secondary | ICD-10-CM | POA: Diagnosis not present

## 2021-12-19 DIAGNOSIS — Z7952 Long term (current) use of systemic steroids: Secondary | ICD-10-CM | POA: Diagnosis not present

## 2021-12-19 DIAGNOSIS — G9341 Metabolic encephalopathy: Secondary | ICD-10-CM | POA: Diagnosis not present

## 2021-12-19 DIAGNOSIS — E782 Mixed hyperlipidemia: Secondary | ICD-10-CM | POA: Diagnosis not present

## 2021-12-19 DIAGNOSIS — D696 Thrombocytopenia, unspecified: Secondary | ICD-10-CM | POA: Diagnosis not present

## 2021-12-19 DIAGNOSIS — E559 Vitamin D deficiency, unspecified: Secondary | ICD-10-CM | POA: Diagnosis not present

## 2021-12-19 DIAGNOSIS — G8191 Hemiplegia, unspecified affecting right dominant side: Secondary | ICD-10-CM | POA: Diagnosis not present

## 2021-12-19 DIAGNOSIS — I10 Essential (primary) hypertension: Secondary | ICD-10-CM | POA: Diagnosis not present

## 2021-12-19 DIAGNOSIS — J189 Pneumonia, unspecified organism: Secondary | ICD-10-CM | POA: Diagnosis not present

## 2021-12-19 DIAGNOSIS — D630 Anemia in neoplastic disease: Secondary | ICD-10-CM | POA: Diagnosis not present

## 2021-12-19 DIAGNOSIS — Z9181 History of falling: Secondary | ICD-10-CM | POA: Diagnosis not present

## 2021-12-19 DIAGNOSIS — C719 Malignant neoplasm of brain, unspecified: Secondary | ICD-10-CM | POA: Diagnosis not present

## 2021-12-20 DIAGNOSIS — G40909 Epilepsy, unspecified, not intractable, without status epilepticus: Secondary | ICD-10-CM | POA: Diagnosis not present

## 2021-12-20 DIAGNOSIS — D696 Thrombocytopenia, unspecified: Secondary | ICD-10-CM | POA: Diagnosis not present

## 2021-12-20 DIAGNOSIS — Z7952 Long term (current) use of systemic steroids: Secondary | ICD-10-CM | POA: Diagnosis not present

## 2021-12-20 DIAGNOSIS — R4701 Aphasia: Secondary | ICD-10-CM | POA: Diagnosis not present

## 2021-12-20 DIAGNOSIS — D630 Anemia in neoplastic disease: Secondary | ICD-10-CM | POA: Diagnosis not present

## 2021-12-20 DIAGNOSIS — R918 Other nonspecific abnormal finding of lung field: Secondary | ICD-10-CM | POA: Diagnosis not present

## 2021-12-20 DIAGNOSIS — I10 Essential (primary) hypertension: Secondary | ICD-10-CM | POA: Diagnosis not present

## 2021-12-20 DIAGNOSIS — G8191 Hemiplegia, unspecified affecting right dominant side: Secondary | ICD-10-CM | POA: Diagnosis not present

## 2021-12-20 DIAGNOSIS — E782 Mixed hyperlipidemia: Secondary | ICD-10-CM | POA: Diagnosis not present

## 2021-12-20 DIAGNOSIS — J189 Pneumonia, unspecified organism: Secondary | ICD-10-CM | POA: Diagnosis not present

## 2021-12-20 DIAGNOSIS — Z9181 History of falling: Secondary | ICD-10-CM | POA: Diagnosis not present

## 2021-12-20 DIAGNOSIS — E559 Vitamin D deficiency, unspecified: Secondary | ICD-10-CM | POA: Diagnosis not present

## 2021-12-20 DIAGNOSIS — C719 Malignant neoplasm of brain, unspecified: Secondary | ICD-10-CM | POA: Diagnosis not present

## 2021-12-20 DIAGNOSIS — G9341 Metabolic encephalopathy: Secondary | ICD-10-CM | POA: Diagnosis not present

## 2021-12-21 DIAGNOSIS — C719 Malignant neoplasm of brain, unspecified: Secondary | ICD-10-CM | POA: Diagnosis not present

## 2021-12-21 DIAGNOSIS — C711 Malignant neoplasm of frontal lobe: Secondary | ICD-10-CM | POA: Diagnosis not present

## 2021-12-24 DIAGNOSIS — Z9181 History of falling: Secondary | ICD-10-CM | POA: Diagnosis not present

## 2021-12-24 DIAGNOSIS — C719 Malignant neoplasm of brain, unspecified: Secondary | ICD-10-CM | POA: Diagnosis not present

## 2021-12-24 DIAGNOSIS — R918 Other nonspecific abnormal finding of lung field: Secondary | ICD-10-CM | POA: Diagnosis not present

## 2021-12-24 DIAGNOSIS — E782 Mixed hyperlipidemia: Secondary | ICD-10-CM | POA: Diagnosis not present

## 2021-12-24 DIAGNOSIS — G8191 Hemiplegia, unspecified affecting right dominant side: Secondary | ICD-10-CM | POA: Diagnosis not present

## 2021-12-24 DIAGNOSIS — E559 Vitamin D deficiency, unspecified: Secondary | ICD-10-CM | POA: Diagnosis not present

## 2021-12-24 DIAGNOSIS — C711 Malignant neoplasm of frontal lobe: Secondary | ICD-10-CM | POA: Diagnosis not present

## 2021-12-24 DIAGNOSIS — G40909 Epilepsy, unspecified, not intractable, without status epilepticus: Secondary | ICD-10-CM | POA: Diagnosis not present

## 2021-12-24 DIAGNOSIS — R4701 Aphasia: Secondary | ICD-10-CM | POA: Diagnosis not present

## 2021-12-24 DIAGNOSIS — G9341 Metabolic encephalopathy: Secondary | ICD-10-CM | POA: Diagnosis not present

## 2021-12-24 DIAGNOSIS — Z7952 Long term (current) use of systemic steroids: Secondary | ICD-10-CM | POA: Diagnosis not present

## 2021-12-24 DIAGNOSIS — I10 Essential (primary) hypertension: Secondary | ICD-10-CM | POA: Diagnosis not present

## 2021-12-24 DIAGNOSIS — D630 Anemia in neoplastic disease: Secondary | ICD-10-CM | POA: Diagnosis not present

## 2021-12-24 DIAGNOSIS — J189 Pneumonia, unspecified organism: Secondary | ICD-10-CM | POA: Diagnosis not present

## 2021-12-24 DIAGNOSIS — D696 Thrombocytopenia, unspecified: Secondary | ICD-10-CM | POA: Diagnosis not present

## 2021-12-25 DIAGNOSIS — G8191 Hemiplegia, unspecified affecting right dominant side: Secondary | ICD-10-CM | POA: Diagnosis not present

## 2021-12-25 DIAGNOSIS — Z7952 Long term (current) use of systemic steroids: Secondary | ICD-10-CM | POA: Diagnosis not present

## 2021-12-25 DIAGNOSIS — G9341 Metabolic encephalopathy: Secondary | ICD-10-CM | POA: Diagnosis not present

## 2021-12-25 DIAGNOSIS — G40909 Epilepsy, unspecified, not intractable, without status epilepticus: Secondary | ICD-10-CM | POA: Diagnosis not present

## 2021-12-25 DIAGNOSIS — R4701 Aphasia: Secondary | ICD-10-CM | POA: Diagnosis not present

## 2021-12-25 DIAGNOSIS — D630 Anemia in neoplastic disease: Secondary | ICD-10-CM | POA: Diagnosis not present

## 2021-12-25 DIAGNOSIS — C719 Malignant neoplasm of brain, unspecified: Secondary | ICD-10-CM | POA: Diagnosis not present

## 2021-12-25 DIAGNOSIS — D696 Thrombocytopenia, unspecified: Secondary | ICD-10-CM | POA: Diagnosis not present

## 2021-12-25 DIAGNOSIS — E782 Mixed hyperlipidemia: Secondary | ICD-10-CM | POA: Diagnosis not present

## 2021-12-25 DIAGNOSIS — E559 Vitamin D deficiency, unspecified: Secondary | ICD-10-CM | POA: Diagnosis not present

## 2021-12-25 DIAGNOSIS — Z9181 History of falling: Secondary | ICD-10-CM | POA: Diagnosis not present

## 2021-12-25 DIAGNOSIS — I10 Essential (primary) hypertension: Secondary | ICD-10-CM | POA: Diagnosis not present

## 2021-12-25 DIAGNOSIS — R918 Other nonspecific abnormal finding of lung field: Secondary | ICD-10-CM | POA: Diagnosis not present

## 2021-12-25 DIAGNOSIS — J189 Pneumonia, unspecified organism: Secondary | ICD-10-CM | POA: Diagnosis not present

## 2021-12-26 DIAGNOSIS — R4701 Aphasia: Secondary | ICD-10-CM | POA: Diagnosis not present

## 2021-12-26 DIAGNOSIS — D696 Thrombocytopenia, unspecified: Secondary | ICD-10-CM | POA: Diagnosis not present

## 2021-12-26 DIAGNOSIS — Z7952 Long term (current) use of systemic steroids: Secondary | ICD-10-CM | POA: Diagnosis not present

## 2021-12-26 DIAGNOSIS — I10 Essential (primary) hypertension: Secondary | ICD-10-CM | POA: Diagnosis not present

## 2021-12-26 DIAGNOSIS — R918 Other nonspecific abnormal finding of lung field: Secondary | ICD-10-CM | POA: Diagnosis not present

## 2021-12-26 DIAGNOSIS — G9341 Metabolic encephalopathy: Secondary | ICD-10-CM | POA: Diagnosis not present

## 2021-12-26 DIAGNOSIS — E559 Vitamin D deficiency, unspecified: Secondary | ICD-10-CM | POA: Diagnosis not present

## 2021-12-26 DIAGNOSIS — Z9181 History of falling: Secondary | ICD-10-CM | POA: Diagnosis not present

## 2021-12-26 DIAGNOSIS — D630 Anemia in neoplastic disease: Secondary | ICD-10-CM | POA: Diagnosis not present

## 2021-12-26 DIAGNOSIS — J189 Pneumonia, unspecified organism: Secondary | ICD-10-CM | POA: Diagnosis not present

## 2021-12-26 DIAGNOSIS — C719 Malignant neoplasm of brain, unspecified: Secondary | ICD-10-CM | POA: Diagnosis not present

## 2021-12-26 DIAGNOSIS — G40909 Epilepsy, unspecified, not intractable, without status epilepticus: Secondary | ICD-10-CM | POA: Diagnosis not present

## 2021-12-26 DIAGNOSIS — G8191 Hemiplegia, unspecified affecting right dominant side: Secondary | ICD-10-CM | POA: Diagnosis not present

## 2021-12-26 DIAGNOSIS — E782 Mixed hyperlipidemia: Secondary | ICD-10-CM | POA: Diagnosis not present

## 2021-12-27 DIAGNOSIS — R569 Unspecified convulsions: Secondary | ICD-10-CM | POA: Diagnosis not present

## 2021-12-28 DIAGNOSIS — R918 Other nonspecific abnormal finding of lung field: Secondary | ICD-10-CM | POA: Diagnosis not present

## 2021-12-28 DIAGNOSIS — E782 Mixed hyperlipidemia: Secondary | ICD-10-CM | POA: Diagnosis not present

## 2021-12-28 DIAGNOSIS — D696 Thrombocytopenia, unspecified: Secondary | ICD-10-CM | POA: Diagnosis not present

## 2021-12-28 DIAGNOSIS — Z7952 Long term (current) use of systemic steroids: Secondary | ICD-10-CM | POA: Diagnosis not present

## 2021-12-28 DIAGNOSIS — Z9181 History of falling: Secondary | ICD-10-CM | POA: Diagnosis not present

## 2021-12-28 DIAGNOSIS — G40909 Epilepsy, unspecified, not intractable, without status epilepticus: Secondary | ICD-10-CM | POA: Diagnosis not present

## 2021-12-28 DIAGNOSIS — R4701 Aphasia: Secondary | ICD-10-CM | POA: Diagnosis not present

## 2021-12-28 DIAGNOSIS — C719 Malignant neoplasm of brain, unspecified: Secondary | ICD-10-CM | POA: Diagnosis not present

## 2021-12-28 DIAGNOSIS — E559 Vitamin D deficiency, unspecified: Secondary | ICD-10-CM | POA: Diagnosis not present

## 2021-12-28 DIAGNOSIS — J189 Pneumonia, unspecified organism: Secondary | ICD-10-CM | POA: Diagnosis not present

## 2021-12-28 DIAGNOSIS — D630 Anemia in neoplastic disease: Secondary | ICD-10-CM | POA: Diagnosis not present

## 2021-12-28 DIAGNOSIS — G8191 Hemiplegia, unspecified affecting right dominant side: Secondary | ICD-10-CM | POA: Diagnosis not present

## 2021-12-28 DIAGNOSIS — G9341 Metabolic encephalopathy: Secondary | ICD-10-CM | POA: Diagnosis not present

## 2021-12-28 DIAGNOSIS — I10 Essential (primary) hypertension: Secondary | ICD-10-CM | POA: Diagnosis not present

## 2021-12-31 DIAGNOSIS — Z7952 Long term (current) use of systemic steroids: Secondary | ICD-10-CM | POA: Diagnosis not present

## 2021-12-31 DIAGNOSIS — R918 Other nonspecific abnormal finding of lung field: Secondary | ICD-10-CM | POA: Diagnosis not present

## 2021-12-31 DIAGNOSIS — E559 Vitamin D deficiency, unspecified: Secondary | ICD-10-CM | POA: Diagnosis not present

## 2021-12-31 DIAGNOSIS — J189 Pneumonia, unspecified organism: Secondary | ICD-10-CM | POA: Diagnosis not present

## 2021-12-31 DIAGNOSIS — D696 Thrombocytopenia, unspecified: Secondary | ICD-10-CM | POA: Diagnosis not present

## 2021-12-31 DIAGNOSIS — I10 Essential (primary) hypertension: Secondary | ICD-10-CM | POA: Diagnosis not present

## 2021-12-31 DIAGNOSIS — R4701 Aphasia: Secondary | ICD-10-CM | POA: Diagnosis not present

## 2021-12-31 DIAGNOSIS — C719 Malignant neoplasm of brain, unspecified: Secondary | ICD-10-CM | POA: Diagnosis not present

## 2021-12-31 DIAGNOSIS — Z9181 History of falling: Secondary | ICD-10-CM | POA: Diagnosis not present

## 2021-12-31 DIAGNOSIS — G8191 Hemiplegia, unspecified affecting right dominant side: Secondary | ICD-10-CM | POA: Diagnosis not present

## 2021-12-31 DIAGNOSIS — G40909 Epilepsy, unspecified, not intractable, without status epilepticus: Secondary | ICD-10-CM | POA: Diagnosis not present

## 2021-12-31 DIAGNOSIS — D630 Anemia in neoplastic disease: Secondary | ICD-10-CM | POA: Diagnosis not present

## 2021-12-31 DIAGNOSIS — G9341 Metabolic encephalopathy: Secondary | ICD-10-CM | POA: Diagnosis not present

## 2021-12-31 DIAGNOSIS — E782 Mixed hyperlipidemia: Secondary | ICD-10-CM | POA: Diagnosis not present

## 2022-01-01 DIAGNOSIS — R918 Other nonspecific abnormal finding of lung field: Secondary | ICD-10-CM | POA: Diagnosis not present

## 2022-01-01 DIAGNOSIS — Z7952 Long term (current) use of systemic steroids: Secondary | ICD-10-CM | POA: Diagnosis not present

## 2022-01-01 DIAGNOSIS — G9341 Metabolic encephalopathy: Secondary | ICD-10-CM | POA: Diagnosis not present

## 2022-01-01 DIAGNOSIS — G8191 Hemiplegia, unspecified affecting right dominant side: Secondary | ICD-10-CM | POA: Diagnosis not present

## 2022-01-01 DIAGNOSIS — D630 Anemia in neoplastic disease: Secondary | ICD-10-CM | POA: Diagnosis not present

## 2022-01-01 DIAGNOSIS — G40909 Epilepsy, unspecified, not intractable, without status epilepticus: Secondary | ICD-10-CM | POA: Diagnosis not present

## 2022-01-01 DIAGNOSIS — D696 Thrombocytopenia, unspecified: Secondary | ICD-10-CM | POA: Diagnosis not present

## 2022-01-01 DIAGNOSIS — E782 Mixed hyperlipidemia: Secondary | ICD-10-CM | POA: Diagnosis not present

## 2022-01-01 DIAGNOSIS — C719 Malignant neoplasm of brain, unspecified: Secondary | ICD-10-CM | POA: Diagnosis not present

## 2022-01-01 DIAGNOSIS — R4701 Aphasia: Secondary | ICD-10-CM | POA: Diagnosis not present

## 2022-01-01 DIAGNOSIS — I10 Essential (primary) hypertension: Secondary | ICD-10-CM | POA: Diagnosis not present

## 2022-01-01 DIAGNOSIS — Z9181 History of falling: Secondary | ICD-10-CM | POA: Diagnosis not present

## 2022-01-01 DIAGNOSIS — J189 Pneumonia, unspecified organism: Secondary | ICD-10-CM | POA: Diagnosis not present

## 2022-01-01 DIAGNOSIS — E559 Vitamin D deficiency, unspecified: Secondary | ICD-10-CM | POA: Diagnosis not present

## 2022-01-02 ENCOUNTER — Other Ambulatory Visit: Payer: 59

## 2022-01-02 DIAGNOSIS — E782 Mixed hyperlipidemia: Secondary | ICD-10-CM | POA: Diagnosis not present

## 2022-01-02 DIAGNOSIS — J189 Pneumonia, unspecified organism: Secondary | ICD-10-CM | POA: Diagnosis not present

## 2022-01-02 DIAGNOSIS — R918 Other nonspecific abnormal finding of lung field: Secondary | ICD-10-CM | POA: Diagnosis not present

## 2022-01-02 DIAGNOSIS — G9341 Metabolic encephalopathy: Secondary | ICD-10-CM | POA: Diagnosis not present

## 2022-01-02 DIAGNOSIS — Z79899 Other long term (current) drug therapy: Secondary | ICD-10-CM | POA: Diagnosis not present

## 2022-01-02 DIAGNOSIS — D630 Anemia in neoplastic disease: Secondary | ICD-10-CM | POA: Diagnosis not present

## 2022-01-02 DIAGNOSIS — Z7952 Long term (current) use of systemic steroids: Secondary | ICD-10-CM | POA: Diagnosis not present

## 2022-01-02 DIAGNOSIS — E559 Vitamin D deficiency, unspecified: Secondary | ICD-10-CM | POA: Diagnosis not present

## 2022-01-02 DIAGNOSIS — G8191 Hemiplegia, unspecified affecting right dominant side: Secondary | ICD-10-CM | POA: Diagnosis not present

## 2022-01-02 DIAGNOSIS — I10 Essential (primary) hypertension: Secondary | ICD-10-CM | POA: Diagnosis not present

## 2022-01-02 DIAGNOSIS — Z9181 History of falling: Secondary | ICD-10-CM | POA: Diagnosis not present

## 2022-01-02 DIAGNOSIS — R4701 Aphasia: Secondary | ICD-10-CM | POA: Diagnosis not present

## 2022-01-02 DIAGNOSIS — G40909 Epilepsy, unspecified, not intractable, without status epilepticus: Secondary | ICD-10-CM | POA: Diagnosis not present

## 2022-01-02 DIAGNOSIS — D696 Thrombocytopenia, unspecified: Secondary | ICD-10-CM | POA: Diagnosis not present

## 2022-01-02 DIAGNOSIS — C719 Malignant neoplasm of brain, unspecified: Secondary | ICD-10-CM | POA: Diagnosis not present

## 2022-01-03 DIAGNOSIS — R531 Weakness: Secondary | ICD-10-CM | POA: Diagnosis not present

## 2022-01-03 DIAGNOSIS — A419 Sepsis, unspecified organism: Secondary | ICD-10-CM | POA: Diagnosis not present

## 2022-01-03 DIAGNOSIS — J189 Pneumonia, unspecified organism: Secondary | ICD-10-CM | POA: Diagnosis not present

## 2022-01-03 LAB — CBC WITH DIFFERENTIAL/PLATELET
Absolute Monocytes: 556 cells/uL (ref 200–950)
Basophils Absolute: 21 cells/uL (ref 0–200)
Basophils Relative: 0.2 %
Eosinophils Absolute: 0 cells/uL — ABNORMAL LOW (ref 15–500)
Eosinophils Relative: 0 %
HCT: 36 % (ref 35.0–45.0)
Hemoglobin: 12.1 g/dL (ref 11.7–15.5)
Lymphs Abs: 417 cells/uL — ABNORMAL LOW (ref 850–3900)
MCH: 35.3 pg — ABNORMAL HIGH (ref 27.0–33.0)
MCHC: 33.6 g/dL (ref 32.0–36.0)
MCV: 105 fL — ABNORMAL HIGH (ref 80.0–100.0)
MPV: 10.2 fL (ref 7.5–12.5)
Monocytes Relative: 5.2 %
Neutro Abs: 9705 cells/uL — ABNORMAL HIGH (ref 1500–7800)
Neutrophils Relative %: 90.7 %
Platelets: 181 10*3/uL (ref 140–400)
RBC: 3.43 10*6/uL — ABNORMAL LOW (ref 3.80–5.10)
RDW: 15.6 % — ABNORMAL HIGH (ref 11.0–15.0)
Total Lymphocyte: 3.9 %
WBC: 10.7 10*3/uL (ref 3.8–10.8)

## 2022-01-03 LAB — COMPLETE METABOLIC PANEL WITH GFR
AG Ratio: 1.5 (calc) (ref 1.0–2.5)
ALT: 17 U/L (ref 6–29)
AST: 18 U/L (ref 10–35)
Albumin: 3.7 g/dL (ref 3.6–5.1)
Alkaline phosphatase (APISO): 59 U/L (ref 37–153)
BUN: 17 mg/dL (ref 7–25)
CO2: 27 mmol/L (ref 20–32)
Calcium: 9.2 mg/dL (ref 8.6–10.4)
Chloride: 107 mmol/L (ref 98–110)
Creat: 0.88 mg/dL (ref 0.50–1.03)
Globulin: 2.4 g/dL (calc) (ref 1.9–3.7)
Glucose, Bld: 97 mg/dL (ref 65–99)
Potassium: 4.8 mmol/L (ref 3.5–5.3)
Sodium: 145 mmol/L (ref 135–146)
Total Bilirubin: 0.3 mg/dL (ref 0.2–1.2)
Total Protein: 6.1 g/dL (ref 6.1–8.1)
eGFR: 79 mL/min/{1.73_m2} (ref >=60)

## 2022-01-03 NOTE — Progress Notes (Signed)
Labs OK

## 2022-01-04 DIAGNOSIS — D630 Anemia in neoplastic disease: Secondary | ICD-10-CM | POA: Diagnosis not present

## 2022-01-04 DIAGNOSIS — R4701 Aphasia: Secondary | ICD-10-CM | POA: Diagnosis not present

## 2022-01-04 DIAGNOSIS — E782 Mixed hyperlipidemia: Secondary | ICD-10-CM | POA: Diagnosis not present

## 2022-01-04 DIAGNOSIS — G9341 Metabolic encephalopathy: Secondary | ICD-10-CM | POA: Diagnosis not present

## 2022-01-04 DIAGNOSIS — C719 Malignant neoplasm of brain, unspecified: Secondary | ICD-10-CM | POA: Diagnosis not present

## 2022-01-04 DIAGNOSIS — Z9181 History of falling: Secondary | ICD-10-CM | POA: Diagnosis not present

## 2022-01-04 DIAGNOSIS — G40909 Epilepsy, unspecified, not intractable, without status epilepticus: Secondary | ICD-10-CM | POA: Diagnosis not present

## 2022-01-04 DIAGNOSIS — Z7952 Long term (current) use of systemic steroids: Secondary | ICD-10-CM | POA: Diagnosis not present

## 2022-01-04 DIAGNOSIS — I10 Essential (primary) hypertension: Secondary | ICD-10-CM | POA: Diagnosis not present

## 2022-01-04 DIAGNOSIS — E559 Vitamin D deficiency, unspecified: Secondary | ICD-10-CM | POA: Diagnosis not present

## 2022-01-04 DIAGNOSIS — R918 Other nonspecific abnormal finding of lung field: Secondary | ICD-10-CM | POA: Diagnosis not present

## 2022-01-04 DIAGNOSIS — J189 Pneumonia, unspecified organism: Secondary | ICD-10-CM | POA: Diagnosis not present

## 2022-01-04 DIAGNOSIS — G8191 Hemiplegia, unspecified affecting right dominant side: Secondary | ICD-10-CM | POA: Diagnosis not present

## 2022-01-04 DIAGNOSIS — D696 Thrombocytopenia, unspecified: Secondary | ICD-10-CM | POA: Diagnosis not present

## 2022-01-07 DIAGNOSIS — C711 Malignant neoplasm of frontal lobe: Secondary | ICD-10-CM | POA: Diagnosis not present

## 2022-01-16 ENCOUNTER — Other Ambulatory Visit: Payer: Self-pay

## 2022-01-16 ENCOUNTER — Other Ambulatory Visit: Payer: 59

## 2022-01-16 ENCOUNTER — Other Ambulatory Visit: Payer: Self-pay | Admitting: Internal Medicine

## 2022-01-16 DIAGNOSIS — Z79899 Other long term (current) drug therapy: Secondary | ICD-10-CM

## 2022-01-16 DIAGNOSIS — Z5111 Encounter for antineoplastic chemotherapy: Secondary | ICD-10-CM | POA: Diagnosis not present

## 2022-01-16 DIAGNOSIS — R0989 Other specified symptoms and signs involving the circulatory and respiratory systems: Secondary | ICD-10-CM | POA: Diagnosis not present

## 2022-01-16 DIAGNOSIS — C719 Malignant neoplasm of brain, unspecified: Secondary | ICD-10-CM

## 2022-01-16 DIAGNOSIS — G40909 Epilepsy, unspecified, not intractable, without status epilepticus: Secondary | ICD-10-CM

## 2022-01-17 LAB — COMPLETE METABOLIC PANEL WITH GFR
AG Ratio: 1.9 (calc) (ref 1.0–2.5)
ALT: 20 U/L (ref 6–29)
AST: 16 U/L (ref 10–35)
Albumin: 3.7 g/dL (ref 3.6–5.1)
Alkaline phosphatase (APISO): 63 U/L (ref 37–153)
BUN: 17 mg/dL (ref 7–25)
CO2: 30 mmol/L (ref 20–32)
Calcium: 9 mg/dL (ref 8.6–10.4)
Chloride: 106 mmol/L (ref 98–110)
Creat: 0.92 mg/dL (ref 0.50–1.03)
Globulin: 2 g/dL (calc) (ref 1.9–3.7)
Glucose, Bld: 91 mg/dL (ref 65–99)
Potassium: 4.7 mmol/L (ref 3.5–5.3)
Sodium: 143 mmol/L (ref 135–146)
Total Bilirubin: 0.3 mg/dL (ref 0.2–1.2)
Total Protein: 5.7 g/dL — ABNORMAL LOW (ref 6.1–8.1)
eGFR: 74 mL/min/{1.73_m2} (ref >=60)

## 2022-01-17 LAB — CBC WITH DIFFERENTIAL/PLATELET
Absolute Monocytes: 111 cells/uL — ABNORMAL LOW (ref 200–950)
Basophils Absolute: 11 cells/uL (ref 0–200)
Basophils Relative: 0.2 %
Eosinophils Absolute: 0 cells/uL — ABNORMAL LOW (ref 15–500)
Eosinophils Relative: 0 %
HCT: 31.9 % — ABNORMAL LOW (ref 35.0–45.0)
Hemoglobin: 10.9 g/dL — ABNORMAL LOW (ref 11.7–15.5)
Lymphs Abs: 302 cells/uL — ABNORMAL LOW (ref 850–3900)
MCH: 35.5 pg — ABNORMAL HIGH (ref 27.0–33.0)
MCHC: 34.2 g/dL (ref 32.0–36.0)
MCV: 103.9 fL — ABNORMAL HIGH (ref 80.0–100.0)
MPV: 10.5 fL (ref 7.5–12.5)
Monocytes Relative: 2.1 %
Neutro Abs: 4876 cells/uL (ref 1500–7800)
Neutrophils Relative %: 92 %
Platelets: 150 10*3/uL (ref 140–400)
RBC: 3.07 10*6/uL — ABNORMAL LOW (ref 3.80–5.10)
RDW: 15.9 % — ABNORMAL HIGH (ref 11.0–15.0)
Total Lymphocyte: 5.7 %
WBC: 5.3 10*3/uL (ref 3.8–10.8)

## 2022-01-22 DIAGNOSIS — C711 Malignant neoplasm of frontal lobe: Secondary | ICD-10-CM | POA: Diagnosis not present

## 2022-01-22 DIAGNOSIS — C719 Malignant neoplasm of brain, unspecified: Secondary | ICD-10-CM | POA: Diagnosis not present

## 2022-02-02 DIAGNOSIS — J189 Pneumonia, unspecified organism: Secondary | ICD-10-CM | POA: Diagnosis not present

## 2022-02-02 DIAGNOSIS — A419 Sepsis, unspecified organism: Secondary | ICD-10-CM | POA: Diagnosis not present

## 2022-02-02 DIAGNOSIS — R531 Weakness: Secondary | ICD-10-CM | POA: Diagnosis not present

## 2022-02-04 DIAGNOSIS — C711 Malignant neoplasm of frontal lobe: Secondary | ICD-10-CM | POA: Diagnosis not present

## 2022-02-04 DIAGNOSIS — C719 Malignant neoplasm of brain, unspecified: Secondary | ICD-10-CM | POA: Diagnosis not present

## 2022-02-15 DIAGNOSIS — C718 Malignant neoplasm of overlapping sites of brain: Secondary | ICD-10-CM | POA: Diagnosis not present

## 2022-02-15 DIAGNOSIS — C719 Malignant neoplasm of brain, unspecified: Secondary | ICD-10-CM | POA: Diagnosis not present

## 2022-02-18 DIAGNOSIS — C711 Malignant neoplasm of frontal lobe: Secondary | ICD-10-CM | POA: Diagnosis not present

## 2022-02-20 DIAGNOSIS — R4701 Aphasia: Secondary | ICD-10-CM | POA: Diagnosis not present

## 2022-02-20 DIAGNOSIS — I10 Essential (primary) hypertension: Secondary | ICD-10-CM | POA: Diagnosis not present

## 2022-02-20 DIAGNOSIS — G9349 Other encephalopathy: Secondary | ICD-10-CM | POA: Diagnosis not present

## 2022-02-20 DIAGNOSIS — C711 Malignant neoplasm of frontal lobe: Secondary | ICD-10-CM | POA: Diagnosis not present

## 2022-02-21 DIAGNOSIS — I10 Essential (primary) hypertension: Secondary | ICD-10-CM | POA: Diagnosis not present

## 2022-02-21 DIAGNOSIS — G9349 Other encephalopathy: Secondary | ICD-10-CM | POA: Diagnosis not present

## 2022-02-21 DIAGNOSIS — C711 Malignant neoplasm of frontal lobe: Secondary | ICD-10-CM | POA: Diagnosis not present

## 2022-02-21 DIAGNOSIS — R4701 Aphasia: Secondary | ICD-10-CM | POA: Diagnosis not present

## 2022-02-22 DIAGNOSIS — I10 Essential (primary) hypertension: Secondary | ICD-10-CM | POA: Diagnosis not present

## 2022-02-22 DIAGNOSIS — G9349 Other encephalopathy: Secondary | ICD-10-CM | POA: Diagnosis not present

## 2022-02-22 DIAGNOSIS — C711 Malignant neoplasm of frontal lobe: Secondary | ICD-10-CM | POA: Diagnosis not present

## 2022-02-22 DIAGNOSIS — R4701 Aphasia: Secondary | ICD-10-CM | POA: Diagnosis not present

## 2022-02-23 DIAGNOSIS — I10 Essential (primary) hypertension: Secondary | ICD-10-CM | POA: Diagnosis not present

## 2022-02-23 DIAGNOSIS — R4701 Aphasia: Secondary | ICD-10-CM | POA: Diagnosis not present

## 2022-02-23 DIAGNOSIS — G9349 Other encephalopathy: Secondary | ICD-10-CM | POA: Diagnosis not present

## 2022-02-23 DIAGNOSIS — C711 Malignant neoplasm of frontal lobe: Secondary | ICD-10-CM | POA: Diagnosis not present

## 2022-02-24 DIAGNOSIS — C711 Malignant neoplasm of frontal lobe: Secondary | ICD-10-CM | POA: Diagnosis not present

## 2022-02-24 DIAGNOSIS — G9349 Other encephalopathy: Secondary | ICD-10-CM | POA: Diagnosis not present

## 2022-02-24 DIAGNOSIS — R4701 Aphasia: Secondary | ICD-10-CM | POA: Diagnosis not present

## 2022-02-24 DIAGNOSIS — I10 Essential (primary) hypertension: Secondary | ICD-10-CM | POA: Diagnosis not present

## 2022-02-25 ENCOUNTER — Ambulatory Visit: Payer: 59 | Admitting: Nurse Practitioner

## 2022-02-25 DIAGNOSIS — I10 Essential (primary) hypertension: Secondary | ICD-10-CM | POA: Diagnosis not present

## 2022-02-25 DIAGNOSIS — C711 Malignant neoplasm of frontal lobe: Secondary | ICD-10-CM | POA: Diagnosis not present

## 2022-02-25 DIAGNOSIS — R4701 Aphasia: Secondary | ICD-10-CM | POA: Diagnosis not present

## 2022-02-25 DIAGNOSIS — G9349 Other encephalopathy: Secondary | ICD-10-CM | POA: Diagnosis not present
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
## 2022-02-27 ENCOUNTER — Ambulatory Visit (INDEPENDENT_AMBULATORY_CARE_PROVIDER_SITE_OTHER): Payer: 59 | Admitting: Internal Medicine

## 2022-02-27 ENCOUNTER — Encounter: Payer: Self-pay | Admitting: Internal Medicine

## 2022-02-27 VITALS — BP 144/78 | HR 81 | Temp 97.9°F | Resp 17 | Ht 63.0 in | Wt 107.8 lb

## 2022-02-27 DIAGNOSIS — C719 Malignant neoplasm of brain, unspecified: Secondary | ICD-10-CM

## 2022-02-27 DIAGNOSIS — G40909 Epilepsy, unspecified, not intractable, without status epilepticus: Secondary | ICD-10-CM | POA: Diagnosis not present

## 2022-02-27 DIAGNOSIS — M79604 Pain in right leg: Secondary | ICD-10-CM | POA: Diagnosis not present

## 2022-02-27 MED ORDER — GABAPENTIN 100 MG PO CAPS: ORAL_CAPSULE | ORAL | 2 refills | Status: DC

## 2022-02-27 NOTE — Patient Instructions (Signed)
Glioblastoma  Glioblastoma is a type of abnormal tissue mass (tumor) that can develop in the brain and spinal cord (central nervous system). This type of tumor is also called glioblastoma multiforme or grade 4 astrocytoma. Glioblastoma tumors are made up of an overgrowth of glial cells of the brain called astrocytes. This type of cancer can grow very quickly and is the most common type of cancerous (malignant) brain tumor in adults. What are the causes? A tumor is formed when astrocytes grow and divide more than normal and form into a mass of tissue. What makes the cells grow and divide excessively is not known. What increases the risk? The following factors may make you more likely to develop this condition: Radiation exposure. Having a genetic disorder such as neurofibromatosis or a family history of cancer syndrome, such as Li-Fraumeni syndrome or Turcot syndrome. Age. People between the ages of 79 and 66 are more likely to develop this tumor. What are the signs or symptoms? Symptoms of this condition may depend on the size and location of the tumor. Symptoms may include: Headache, which may be worse in the morning and improve after vomiting. Nausea and vomiting. Vision, hearing, or speech changes and problems. Seizures. Inability to walk, loss of balance, or weakness and numbness on one side of the body or in an arm or leg. Changes in mood, personality, memory, or thinking. Changes in weight or energy. Symptoms are most commonly caused by increased pressure in the brain. How is this diagnosed? This condition is diagnosed based on a medical history and a physical exam, including a neurological exam. Brain imaging tests will also be done, such as a CT scan or MRI. A sample of the tumor will be taken and studied in a lab (biopsy) to confirm the diagnosis. Glioblastomas are high-grade tumors, which means they are fast growing. How is this treated? There are several treatment options for this  condition. Often, a person will have more than one type of treatment. Treatment may include: Surgery to remove as much of the tumor as possible. High-energy rays (radiation therapy) to help shrink or kill the tumor. There are different types of radiation therapy, including intensity-modulated radiation therapy, proton beam therapy, and stereotactic radiation therapy (radiosurgery). Chemotherapy. This is the use of medicines to shrink or kill the tumor. The medicines can be given by injection or as a pill. Sometimes, chemotherapy can be delivered by placing a wafer that contains the medicine directly in the area where the tumor was removed during surgery. Targeted therapy. This uses substances that damage or kill cancer cells without affecting normal cells. Immunotherapy. This involves the use of medicines to boost the body's defense system (immune system) to kill the tumor cells. New treatments through clinical trials. Steroid medicines may be given to decrease brain swelling and improve symptoms. Other medicines may also be given to treat or prevent seizures. Your health care provider may also refer you to physical, occupational, or speech therapy, or a combination of these therapies, as part of your treatment. Glioblastomas can come back after treatment. If you have a glioblastoma that comes back, you may need additional treatment. Follow these instructions at home: Take over-the-counter and prescription medicines only as told by your health care provider. Consider joining a support group. Work with your health care provider to manage any side effects that you experience during treatment. Do exercises and activities as told by your health care provider or therapist. Keep all follow-up visits. This is important. Your health care  provider will closely monitor you for any signs that the cancer is returning. You may need more frequent visits with your health care provider as well as periodic imaging  tests, such as MRIs, to monitor the tumor or a previous tumor location. Where to find more information American Brain Tumor Association: www.abta.org American Cancer Society: www.cancer.[place]: www.cancer.gov Contact a health care provider if: You have worsening symptoms or symptoms that have returned. You have diarrhea or abdominal pain. You cannot eat or drink what you need. You have a fever. Get help right away if: You have a seizure, particularly if this is new. You have new symptoms, such as vision problems or trouble walking. You have bleeding that does not stop. You have trouble breathing. These symptoms may represent a serious problem that is an emergency. Do not wait to see if the symptoms will go away. Get medical help right away. Call your local emergency services (911 in the [place] Do not drive yourself to the hospital. Summary Glioblastoma, also called glioblastoma multiforme or grade 4 astrocytoma, is a type of abnormal tissue mass (tumor) that can develop in the brain and spinal cord (central nervous system). This type of cancer can grow quickly. This condition is diagnosed based on a medical history and a physical exam. Brain imaging tests will also be done, such as a CT scan or MRI. A sample of the tumor will be taken and studied in a lab (biopsy) to confirm the diagnosis. Treatment may include surgery to remove the tumor as well as radiation therapy, chemotherapy, targeted therapy, or immunotherapy. This information is not intended to replace advice given to you by your health care provider. Make sure you discuss any questions you have with your health care provider. Document Revised: 12/25/2019 Document Reviewed: 12/25/2019 Elsevier Patient Education  [place].

## 2022-02-27 NOTE — Progress Notes (Signed)
Future Appointments  Date Time Provider Department  02/27/2022  3:00 PM Unk Pinto, MD [facility]  06/03/2022 10:00 AM Unk Pinto, MD [facility]    History of Present Illness:                                                   This very nice 56 y.o.  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         Medications  Current Outpatient Medications (Endocrine & Metabolic):    dexamethasone (DECADRON) 2 MG tablet, Take 2 mg by mouth 2 (two) times daily.  Current Outpatient Medications (Cardiovascular):    amLODipine (NORVASC) 10 MG tablet, Take 10 mg by mouth daily.   lisinopril (ZESTRIL) 20 MG tablet, Take 20 mg by mouth daily.   Current Outpatient Medications (Analgesics):    acetaminophen (TYLENOL) 325 MG tablet, Take 2 tablets (650 mg total) by mouth every 6 (six) hours as needed for mild pain (or Fever >/= 101).   Current Outpatient Medications (Other):    Cholecalciferol (VITAMIN D3) 125 MCG (5000 UT) CAPS, Take 10,000 Units by mouth 2 (two) times a week.   clonazePAM (KLONOPIN) 0.5 MG tablet, Take 0.5 mg by mouth as needed (seizure). >3 small seizures in a day   FYCOMPA 4 MG TABS, Take 4 mg by mouth at bedtime.   GLEOSTINE 40 MG capsule, Take 120 mg by mouth See admin instructions. Every six weeks   lacosamide (VIMPAT) 200 MG TABS tablet, Take 1 tablet (200 mg total) by mouth 2 (two) times daily.   levETIRAcetam (KEPPRA) 100 MG/ML solution, Take 1,500 mg by mouth 2 (two) times daily.   LORazepam (ATIVAN) 0.5 MG tablet, Take 0.5 mg by mouth every 4 (four) hours as needed for seizure.   Midazolam (NAYZILAM) 5 MG/0.1ML SOLN, Place 5 mg into the nose once.   nystatin (MYCOSTATIN) 100000 UNIT/ML suspension, Take 5 mLs (500,000 Units total) by mouth 4  (four) times daily. Swish and spit   ondansetron (ZOFRAN) 4 MG tablet, Take 1 tablet (4 mg total) by mouth every 6 (six) hours as needed for nausea.   pantoprazole (PROTONIX) 40 MG tablet, Take 40 mg by mouth daily.   polyethylene glycol (MIRALAX / GLYCOLAX) 17 g packet, Take 17 g by mouth 2 (two) times daily.  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   Observations/Objective:  There were no vitals taken for this visit.  HEENT - WNL. Neck - supple.  Chest - Clear equal BS. Cor - Nl HS. RRR w/o sig MGR. PP 1(+). No edema. MS- FROM w/o deformities.  Gait Nl. Neuro -  Nl w/o focal abnormalities.   Assessment and Plan:      Follow Up Instructions:        I discussed the assessment  and treatment plan with the patient. The patient was provided an opportunity to ask questions and all were answered. The patient agreed with the plan and demonstrated an understanding of the instructions.       The patient was advised to call back or seek an in-person evaluation if the symptoms worsen or if the condition fails to improve as anticipated.    Kirtland Bouchard, MD

## 2022-02-27 NOTE — Progress Notes (Signed)
<>   Future Appointments  Date Time Provider Department  02/27/2022  3:00 PM Unk Pinto, MD [facility]  06/03/2022 10:00 AM Unk Pinto, MD [facility]    History of Present Illness:       This very nice 56 y.o.  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 Recently she   ( & husband - [name] & mother - Lovey Newcomer) has agreed to go on Hospice and now she has rescinded that & has decided to undergo  more chemotherapy.  she is brought in today by husband  & her mother who provides her daytime care. Apparently she has intermittently c/o vague pains in the distal RLE & foot and today she denies pains.      Patient obviously has both receptive & expressive aphasia.                                                                                                                                                                           Medications  Current Outpatient Medications (Endocrine & Metabolic):    dexamethasone (DECADRON) 2 MG tablet, Take 2 mg by mouth 2 (two) times daily.  Current Outpatient Medications (Cardiovascular):    amLODipine (NORVASC) 10 MG tablet, Take 10 mg by mouth daily.   lisinopril (ZESTRIL) 20 MG tablet, Take 20 mg by mouth daily.   Current Outpatient Medications (Analgesics):    acetaminophen (TYLENOL) 325 MG tablet, Take 2 tablets (650 mg total) by mouth every 6 (six) hours as needed for mild pain (or Fever >/= 101).   Current Outpatient Medications (Other):    Cholecalciferol (VITAMIN D3) 125 MCG (5000 UT) CAPS, Take 10,000 Units by mouth 2 (two) times a week.   clonazePAM (KLONOPIN) 0.5 MG tablet, Take 0.5 mg by mouth as needed (seizure). >3 small seizures in a day   FYCOMPA 4 MG TABS, Take 4 mg by mouth at bedtime.    GLEOSTINE 40 MG capsule, Take 120 mg by mouth See admin instructions. Every six weeks   lacosamide (VIMPAT) 200 MG TABS tablet, Take 1 tablet (200 mg total) by mouth 2 (two) times daily.   levETIRAcetam (KEPPRA) 100 MG/ML solution, Take 1,500 mg by mouth 2 (two) times daily.   LORazepam (ATIVAN) 0.5 MG tablet, Take 0.5 mg by mouth every 4 (four) hours as needed for seizure.   Midazolam (NAYZILAM) 5 MG/0.1ML SOLN, Place 5 mg into the nose once.   nystatin (MYCOSTATIN) 100000 UNIT/ML suspension, Take 5 mLs (  500,000 Units total) by mouth 4 (four) times daily. Swish and spit   ondansetron (ZOFRAN) 4 MG tablet, Take 1 tablet (4 mg total) by mouth every 6 (six) hours as needed for nausea.   pantoprazole (PROTONIX) 40 MG tablet, Take 40 mg by mouth daily.   polyethylene glycol (MIRALAX / GLYCOLAX) 17 g packet, Take 17 g by mouth 2 (two) times daily.  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   Observations/Objective:  BP (!) 144/78   Pulse 81   Temp 97.9 F (36.6 C)   Resp 17   Ht '5\' 3"'$  (1.6 m)   Wt 107 lb 12.8 oz (48.9 kg)   SpO2 99%   BMI 19.10 kg/m   HEENT - WNL. Neck - supple.  Chest - Clear equal BS. Cor - Nl HS. RRR w/o sig MGR. PP 1(+). No edema. MS- FROM w/o deformities.  Gait Nl. Nl Rt hip & knee &  ankle ROM w/o restriction, STS or crepitus.  No calf tenderness. Neuro -  Aphasia. No obvious major motor deficit.    Assessment and Plan:  1. Right leg pain  - gabapentin (NEURONTIN) 100 MG capsule; Take  1 capsule  3 x /day   Dispense: 90 capsule; Refill: 2  2. 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 GBM (glioblastoma multiforme) ([place])   4. Seizure disorder [facility])   - Discussed with husband & mother  will try low dose Gabapentin 100 mg 1 -3 x /day with sedative precautions     Follow Up Instructions:        I discussed the assessment and treatment plan with the patient. The patient was provided an opportunity to ask questions and all were answered. The patient agreed with the plan and demonstrated an understanding of the instructions.       The patient was advised to call back or seek an in-person evaluation if the symptoms worsen or if the condition fails to improve as anticipated.   Kirtland Bouchard, MD

## 2022-03-04 ENCOUNTER — Other Ambulatory Visit: Payer: 59

## 2022-03-04 DIAGNOSIS — C711 Malignant neoplasm of frontal lobe: Secondary | ICD-10-CM | POA: Diagnosis not present

## 2022-03-04 LAB — COMPLETE METABOLIC PANEL WITH GFR
AG Ratio: 1.8 (calc) (ref 1.0–2.5)
ALT: 23 U/L (ref 6–29)
AST: 21 U/L (ref 10–35)
Albumin: 3.7 g/dL (ref 3.6–5.1)
Alkaline phosphatase (APISO): 69 U/L (ref 37–153)
BUN: 21 mg/dL (ref 7–25)
CO2: 33 mmol/L — ABNORMAL HIGH (ref 20–32)
Calcium: 9.4 mg/dL (ref 8.6–10.4)
Chloride: 105 mmol/L (ref 98–110)
Creat: 0.71 mg/dL (ref 0.50–1.03)
Globulin: 2.1 g/dL (calc) (ref 1.9–3.7)
Glucose, Bld: 84 mg/dL (ref 65–99)
Potassium: 5 mmol/L (ref 3.5–5.3)
Sodium: 143 mmol/L (ref 135–146)
Total Bilirubin: 0.3 mg/dL (ref 0.2–1.2)
Total Protein: 5.8 g/dL — ABNORMAL LOW (ref 6.1–8.1)
eGFR: 102 mL/min/{1.73_m2} (ref >=60)

## 2022-03-04 LAB — CBC WITH DIFFERENTIAL/PLATELET
Absolute Monocytes: 1061 {cells}/uL — ABNORMAL HIGH (ref 200–950)
Basophils Absolute: 40 {cells}/uL (ref 0–200)
Basophils Relative: 0.4 %
Eosinophils Absolute: 20 {cells}/uL (ref 15–500)
Eosinophils Relative: 0.2 %
HCT: 35.9 % (ref 35.0–45.0)
Hemoglobin: 12.2 g/dL (ref 11.7–15.5)
Lymphs Abs: 475 {cells}/uL — ABNORMAL LOW (ref 850–3900)
MCH: 34.4 pg — ABNORMAL HIGH (ref 27.0–33.0)
MCHC: 34 g/dL (ref 32.0–36.0)
MCV: 101.1 fL — ABNORMAL HIGH (ref 80.0–100.0)
MPV: 10.1 fL (ref 7.5–12.5)
Monocytes Relative: 10.5 %
Neutro Abs: 8504 {cells}/uL — ABNORMAL HIGH (ref 1500–7800)
Neutrophils Relative %: 84.2 %
Platelets: 160 10*3/uL (ref 140–400)
RBC: 3.55 Million/uL — ABNORMAL LOW (ref 3.80–5.10)
RDW: 15.8 % — ABNORMAL HIGH (ref 11.0–15.0)
Total Lymphocyte: 4.7 %
WBC: 10.1 10*3/uL (ref 3.8–10.8)

## 2022-03-04 LAB — URINALYSIS, ROUTINE W REFLEX MICROSCOPIC
Bacteria, UA: NONE SEEN /HPF
Bilirubin Urine: NEGATIVE
Glucose, UA: NEGATIVE
Hgb urine dipstick: NEGATIVE
Hyaline Cast: NONE SEEN /LPF
Ketones, ur: NEGATIVE
Leukocytes,Ua: NEGATIVE
Nitrite: NEGATIVE
Specific Gravity, Urine: 1.005 (ref 1.001–1.035)
Squamous Epithelial / HPF: NONE SEEN /HPF (ref <=5)
WBC, UA: NONE SEEN /HPF (ref 0–5)
pH: 7 (ref 5.0–8.0)

## 2022-03-04 LAB — MICROSCOPIC MESSAGE

## 2022-03-05 DIAGNOSIS — C711 Malignant neoplasm of frontal lobe: Secondary | ICD-10-CM | POA: Diagnosis not present

## 2022-03-05 DIAGNOSIS — A419 Sepsis, unspecified organism: Secondary | ICD-10-CM | POA: Diagnosis not present

## 2022-03-05 DIAGNOSIS — R531 Weakness: Secondary | ICD-10-CM | POA: Diagnosis not present

## 2022-03-05 DIAGNOSIS — J189 Pneumonia, unspecified organism: Secondary | ICD-10-CM | POA: Diagnosis not present

## 2022-03-18 ENCOUNTER — Other Ambulatory Visit: Payer: Self-pay | Admitting: Internal Medicine

## 2022-03-18 DIAGNOSIS — Z5111 Encounter for antineoplastic chemotherapy: Secondary | ICD-10-CM

## 2022-03-18 DIAGNOSIS — Z79899 Other long term (current) drug therapy: Secondary | ICD-10-CM

## 2022-03-18 DIAGNOSIS — C711 Malignant neoplasm of frontal lobe: Secondary | ICD-10-CM

## 2022-03-18 NOTE — Progress Notes (Unsigned)
Future Appointments  Date Time Provider Department  03/19/2022 10:30 AM Unk Pinto, MD [facility]  06/03/2022 10:00 AM Unk Pinto, MD [facility]    History of Present Illness:                                                       This very nice and very unfortunate  56 y.o.  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 with ongoing bimonthly chemo .until in April 2023 she was dx'd with a Glioblastoma Multiforme.  About 2 weeks ago , she was seen with vaghue pains/discomfort in the RLE & exam was unremarkable.  Low dose Gabapentin was added to her med regimen.        Medications  Current Outpatient Medications (Endocrine & Metabolic):    dexamethasone (DECADRON) 2 MG tablet, Take 2 mg by mouth 2 (two) times daily.  Current Outpatient Medications (Cardiovascular):    amLODipine (NORVASC) 10 MG tablet, Take 10 mg by mouth daily.   lisinopril (ZESTRIL) 20 MG tablet, Take 20 mg by mouth daily.   Current Outpatient Medications (Analgesics):    acetaminophen (TYLENOL) 325 MG tablet, Take 2 tablets (650 mg total) by mouth every 6 (six) hours as needed for mild pain (or Fever >/= 101).  Current Outpatient Medications (Other):    Cholecalciferol (VITAMIN D3) 125 MCG (5000 UT) CAPS, Take 10,000 Units by mouth 2 (two) times a week.     clonazePAM (KLONOPIN) 0.5 MG tablet, Take 0.5 mg by mouth as needed (seizure). >3 small seizures in a day   FYCOMPA 4 MG TABS, Take 4 mg by mouth at bedtime.   gabapentin (NEURONTIN) 100 MG capsule, Take  1 capsule  3 x /day   GLEOSTINE 40 MG capsule, Take 120 mg by mouth See admin instructions. Every six weeks   lacosamide (VIMPAT) 200 MG TABS tablet, Take 1 tablet (200 mg total) by mouth 2 (two) times daily.   levETIRAcetam (KEPPRA) 100 MG/ML solution, Take 1,500 mg by mouth 2 (two) times daily.   LORazepam (ATIVAN)  0.5 MG tablet, Take 0.5 mg by mouth every 4 (four) hours as needed for seizure.   Midazolam (NAYZILAM) 5 MG/0.1ML SOLN, Place 5 mg into the nose once.     nystatin (MYCOSTATIN) 100000 UNIT/ML suspension, Take 5 mLs (500,000 Units total) by mouth 4 (four) times daily. Swish and spit   ondansetron (ZOFRAN) 4 MG tablet, Take 1 tablet (4 mg total) by mouth every 6 (six) hours as needed for nausea.   pantoprazole (PROTONIX) 40 MG tablet, Take 40 mg by mouth daily.   polyethylene glycol (MIRALAX / GLYCOLAX) 17 g packet, Take 17 g by mouth 2 (two) times daily.  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   Observations/Objective:  There were no vitals taken for this visit.  HEENT - WNL. Neck - supple.  Chest - Clear equal  BS. Cor - Nl HS. RRR w/o sig MGR. PP 1(+). No edema. MS- FROM w/o deformities.  Gait Nl. Neuro -  both receptive /expressive Aphasia. .   Assessment and Plan:      Follow Up Instructions:        I discussed the assessment and treatment plan with the patient. The patient was provided an opportunity to ask questions and all were answered. The patient agreed with the plan and demonstrated an understanding of the instructions.       The patient was advised to call back or seek an in-person evaluation if the symptoms worsen or if the condition fails to improve as anticipated.    Kirtland Bouchard, MD

## 2022-03-19 ENCOUNTER — Ambulatory Visit (INDEPENDENT_AMBULATORY_CARE_PROVIDER_SITE_OTHER): Payer: 59 | Admitting: Internal Medicine

## 2022-03-19 ENCOUNTER — Encounter: Payer: Self-pay | Admitting: Internal Medicine

## 2022-03-19 VITALS — HR 76 | Temp 97.9°F | Resp 16 | Ht 63.0 in | Wt 107.0 lb

## 2022-03-19 DIAGNOSIS — I10 Essential (primary) hypertension: Secondary | ICD-10-CM | POA: Diagnosis not present

## 2022-03-19 DIAGNOSIS — I872 Venous insufficiency (chronic) (peripheral): Secondary | ICD-10-CM

## 2022-03-19 MED ORDER — OLMESARTAN MEDOXOMIL 40 MG PO TABS: ORAL_TABLET | ORAL | 3 refills | Status: DC

## 2022-03-21 ENCOUNTER — Other Ambulatory Visit: Payer: Self-pay

## 2022-03-21 ENCOUNTER — Other Ambulatory Visit: Payer: 59

## 2022-03-21 DIAGNOSIS — C711 Malignant neoplasm of frontal lobe: Secondary | ICD-10-CM | POA: Diagnosis not present

## 2022-03-21 DIAGNOSIS — Z79899 Other long term (current) drug therapy: Secondary | ICD-10-CM | POA: Diagnosis not present

## 2022-03-21 DIAGNOSIS — Z5111 Encounter for antineoplastic chemotherapy: Secondary | ICD-10-CM | POA: Diagnosis not present

## 2022-03-21 LAB — URINALYSIS, ROUTINE W REFLEX MICROSCOPIC
Bacteria, UA: NONE SEEN /HPF
Bilirubin Urine: NEGATIVE
Glucose, UA: NEGATIVE
Hyaline Cast: NONE SEEN /LPF
Ketones, ur: NEGATIVE
Leukocytes,Ua: NEGATIVE
Nitrite: NEGATIVE
Specific Gravity, Urine: 1.02 (ref 1.001–1.035)
Squamous Epithelial / HPF: NONE SEEN /HPF (ref <=5)
pH: 6 (ref 5.0–8.0)

## 2022-03-21 LAB — CBC WITH DIFFERENTIAL/PLATELET
Absolute Monocytes: 609 cells/uL (ref 200–950)
Basophils Absolute: 9 cells/uL (ref 0–200)
Basophils Relative: 0.1 %
Eosinophils Absolute: 9 cells/uL — ABNORMAL LOW (ref 15–500)
Eosinophils Relative: 0.1 %
HCT: 32.7 % — ABNORMAL LOW (ref 35.0–45.0)
Hemoglobin: 11.2 g/dL — ABNORMAL LOW (ref 11.7–15.5)
Lymphs Abs: 278 cells/uL — ABNORMAL LOW (ref 850–3900)
MCH: 34.3 pg — ABNORMAL HIGH (ref 27.0–33.0)
MCHC: 34.3 g/dL (ref 32.0–36.0)
MCV: 100 fL (ref 80.0–100.0)
MPV: 10.4 fL (ref 7.5–12.5)
Monocytes Relative: 7 %
Neutro Abs: 7795 cells/uL (ref 1500–7800)
Neutrophils Relative %: 89.6 %
Platelets: 129 10*3/uL — ABNORMAL LOW (ref 140–400)
RBC: 3.27 10*6/uL — ABNORMAL LOW (ref 3.80–5.10)
RDW: 15.1 % — ABNORMAL HIGH (ref 11.0–15.0)
Total Lymphocyte: 3.2 %
WBC: 8.7 10*3/uL (ref 3.8–10.8)

## 2022-03-21 LAB — COMPLETE METABOLIC PANEL WITH GFR
AG Ratio: 1.8 (calc) (ref 1.0–2.5)
ALT: 18 U/L (ref 6–29)
AST: 16 U/L (ref 10–35)
Albumin: 3.3 g/dL — ABNORMAL LOW (ref 3.6–5.1)
Alkaline phosphatase (APISO): 66 U/L (ref 37–153)
BUN/Creatinine Ratio: 41 (calc) — ABNORMAL HIGH (ref 6–22)
BUN: 28 mg/dL — ABNORMAL HIGH (ref 7–25)
CO2: 29 mmol/L (ref 20–32)
Calcium: 8.5 mg/dL — ABNORMAL LOW (ref 8.6–10.4)
Chloride: 105 mmol/L (ref 98–110)
Creat: 0.69 mg/dL (ref 0.50–1.03)
Globulin: 1.8 g/dL (calc) — ABNORMAL LOW (ref 1.9–3.7)
Glucose, Bld: 85 mg/dL (ref 65–99)
Potassium: 4.4 mmol/L (ref 3.5–5.3)
Sodium: 141 mmol/L (ref 135–146)
Total Bilirubin: 0.3 mg/dL (ref 0.2–1.2)
Total Protein: 5.1 g/dL — ABNORMAL LOW (ref 6.1–8.1)
eGFR: 104 mL/min/{1.73_m2} (ref >=60)

## 2022-03-21 LAB — MICROSCOPIC MESSAGE

## 2022-03-23 ENCOUNTER — Emergency Department (HOSPITAL_COMMUNITY): Payer: 59

## 2022-03-23 ENCOUNTER — Inpatient Hospital Stay (HOSPITAL_COMMUNITY)
Admission: EM | Admit: 2022-03-23 | Discharge: 2022-03-28 | DRG: 101 | Disposition: A | Discharge status: Hospice | Payer: 59 | Attending: Internal Medicine | Admitting: Internal Medicine

## 2022-03-23 ENCOUNTER — Inpatient Hospital Stay (HOSPITAL_COMMUNITY): Payer: 59

## 2022-03-23 ENCOUNTER — Other Ambulatory Visit: Payer: Self-pay

## 2022-03-23 ENCOUNTER — Encounter (HOSPITAL_COMMUNITY): Payer: Self-pay | Admitting: Internal Medicine

## 2022-03-23 DIAGNOSIS — R69 Illness, unspecified: Secondary | ICD-10-CM | POA: Diagnosis not present

## 2022-03-23 DIAGNOSIS — Z515 Encounter for palliative care: Secondary | ICD-10-CM | POA: Diagnosis not present

## 2022-03-23 DIAGNOSIS — R9089 Other abnormal findings on diagnostic imaging of central nervous system: Secondary | ICD-10-CM | POA: Diagnosis not present

## 2022-03-23 DIAGNOSIS — R569 Unspecified convulsions: Secondary | ICD-10-CM

## 2022-03-23 DIAGNOSIS — G40909 Epilepsy, unspecified, not intractable, without status epilepticus: Secondary | ICD-10-CM | POA: Diagnosis not present

## 2022-03-23 DIAGNOSIS — R451 Restlessness and agitation: Secondary | ICD-10-CM | POA: Diagnosis not present

## 2022-03-23 DIAGNOSIS — R531 Weakness: Secondary | ICD-10-CM | POA: Diagnosis not present

## 2022-03-23 DIAGNOSIS — A419 Sepsis, unspecified organism: Principal | ICD-10-CM | POA: Diagnosis present

## 2022-03-23 DIAGNOSIS — R918 Other nonspecific abnormal finding of lung field: Secondary | ICD-10-CM | POA: Diagnosis not present

## 2022-03-23 DIAGNOSIS — E8809 Other disorders of plasma-protein metabolism, not elsewhere classified: Secondary | ICD-10-CM | POA: Diagnosis not present

## 2022-03-23 DIAGNOSIS — Z66 Do not resuscitate: Secondary | ICD-10-CM | POA: Diagnosis not present

## 2022-03-23 DIAGNOSIS — R6510 Systemic inflammatory response syndrome (SIRS) of non-infectious origin without acute organ dysfunction: Secondary | ICD-10-CM | POA: Diagnosis not present

## 2022-03-23 DIAGNOSIS — D696 Thrombocytopenia, unspecified: Secondary | ICD-10-CM | POA: Diagnosis present

## 2022-03-23 DIAGNOSIS — G928 Other toxic encephalopathy: Secondary | ICD-10-CM | POA: Diagnosis not present

## 2022-03-23 DIAGNOSIS — I10 Essential (primary) hypertension: Secondary | ICD-10-CM | POA: Diagnosis present

## 2022-03-23 DIAGNOSIS — G40901 Epilepsy, unspecified, not intractable, with status epilepticus: Secondary | ICD-10-CM | POA: Diagnosis not present

## 2022-03-23 DIAGNOSIS — R000 Tachycardia, unspecified: Secondary | ICD-10-CM | POA: Diagnosis not present

## 2022-03-23 DIAGNOSIS — Z993 Dependence on wheelchair: Secondary | ICD-10-CM

## 2022-03-23 DIAGNOSIS — R404 Transient alteration of awareness: Secondary | ICD-10-CM | POA: Diagnosis not present

## 2022-03-23 DIAGNOSIS — D630 Anemia in neoplastic disease: Secondary | ICD-10-CM | POA: Diagnosis not present

## 2022-03-23 DIAGNOSIS — F419 Anxiety disorder, unspecified: Secondary | ICD-10-CM | POA: Diagnosis not present

## 2022-03-23 DIAGNOSIS — J9811 Atelectasis: Secondary | ICD-10-CM | POA: Diagnosis not present

## 2022-03-23 DIAGNOSIS — R4182 Altered mental status, unspecified: Secondary | ICD-10-CM

## 2022-03-23 DIAGNOSIS — R4701 Aphasia: Secondary | ICD-10-CM | POA: Diagnosis not present

## 2022-03-23 DIAGNOSIS — C713 Malignant neoplasm of parietal lobe: Secondary | ICD-10-CM | POA: Diagnosis not present

## 2022-03-23 DIAGNOSIS — Z888 Allergy status to other drugs, medicaments and biological substances status: Secondary | ICD-10-CM

## 2022-03-23 DIAGNOSIS — Z7189 Other specified counseling: Secondary | ICD-10-CM | POA: Diagnosis not present

## 2022-03-23 DIAGNOSIS — C719 Malignant neoplasm of brain, unspecified: Secondary | ICD-10-CM | POA: Diagnosis present

## 2022-03-23 DIAGNOSIS — Z79899 Other long term (current) drug therapy: Secondary | ICD-10-CM | POA: Diagnosis not present

## 2022-03-23 DIAGNOSIS — G893 Neoplasm related pain (acute) (chronic): Secondary | ICD-10-CM | POA: Diagnosis not present

## 2022-03-23 DIAGNOSIS — R509 Fever, unspecified: Secondary | ICD-10-CM | POA: Diagnosis not present

## 2022-03-23 DIAGNOSIS — E785 Hyperlipidemia, unspecified: Secondary | ICD-10-CM | POA: Diagnosis present

## 2022-03-23 DIAGNOSIS — R52 Pain, unspecified: Secondary | ICD-10-CM

## 2022-03-23 DIAGNOSIS — J302 Other seasonal allergic rhinitis: Secondary | ICD-10-CM | POA: Diagnosis not present

## 2022-03-23 DIAGNOSIS — C710 Malignant neoplasm of cerebrum, except lobes and ventricles: Secondary | ICD-10-CM | POA: Diagnosis not present

## 2022-03-23 DIAGNOSIS — E161 Other hypoglycemia: Secondary | ICD-10-CM | POA: Diagnosis not present

## 2022-03-23 DIAGNOSIS — K828 Other specified diseases of gallbladder: Secondary | ICD-10-CM | POA: Diagnosis not present

## 2022-03-23 DIAGNOSIS — R400 Somnolence: Secondary | ICD-10-CM | POA: Diagnosis not present

## 2022-03-23 DIAGNOSIS — E162 Hypoglycemia, unspecified: Secondary | ICD-10-CM | POA: Diagnosis not present

## 2022-03-23 LAB — COMPREHENSIVE METABOLIC PANEL
ALT: 25 U/L (ref 0–44)
AST: 28 U/L (ref 15–41)
Albumin: 3 g/dL — ABNORMAL LOW (ref 3.5–5.0)
Alkaline Phosphatase: 69 U/L (ref 38–126)
Anion gap: 8 (ref 5–15)
BUN: 25 mg/dL — ABNORMAL HIGH (ref 6–20)
CO2: 24 mmol/L (ref 22–32)
Calcium: 8.3 mg/dL — ABNORMAL LOW (ref 8.9–10.3)
Chloride: 102 mmol/L (ref 98–111)
Creatinine, Ser: 0.74 mg/dL (ref 0.44–1.00)
GFR, Estimated: >60 mL/min (ref >60)
Glucose, Bld: 108 mg/dL — ABNORMAL HIGH (ref 70–99)
Potassium: 3.9 mmol/L (ref 3.5–5.1)
Sodium: 134 mmol/L — ABNORMAL LOW (ref 135–145)
Total Bilirubin: 0.6 mg/dL (ref 0.3–1.2)
Total Protein: 6 g/dL — ABNORMAL LOW (ref 6.5–8.1)

## 2022-03-23 LAB — CBC WITH DIFFERENTIAL/PLATELET
Abs Immature Granulocytes: 0.29 10*3/uL — ABNORMAL HIGH (ref 0.00–0.07)
Basophils Absolute: 0 10*3/uL (ref 0.0–0.1)
Basophils Relative: 0 %
Eosinophils Absolute: 0 10*3/uL (ref 0.0–0.5)
Eosinophils Relative: 0 %
HCT: 37 % (ref 36.0–46.0)
Hemoglobin: 12 g/dL (ref 12.0–15.0)
Immature Granulocytes: 3 %
Lymphocytes Relative: 5 %
Lymphs Abs: 0.5 10*3/uL — ABNORMAL LOW (ref 0.7–4.0)
MCH: 33.9 pg (ref 26.0–34.0)
MCHC: 32.4 g/dL (ref 30.0–36.0)
MCV: 104.5 fL — ABNORMAL HIGH (ref 80.0–100.0)
Monocytes Absolute: 0.8 10*3/uL (ref 0.1–1.0)
Monocytes Relative: 8 %
Neutro Abs: 8.2 10*3/uL — ABNORMAL HIGH (ref 1.7–7.7)
Neutrophils Relative %: 84 %
Platelets: 103 10*3/uL — ABNORMAL LOW (ref 150–400)
RBC: 3.54 MIL/uL — ABNORMAL LOW (ref 3.87–5.11)
RDW: 16 % — ABNORMAL HIGH (ref 11.5–15.5)
WBC: 9.7 10*3/uL (ref 4.0–10.5)
nRBC: 0.3 % — ABNORMAL HIGH (ref 0.0–0.2)

## 2022-03-23 LAB — PROTIME-INR
INR: 1 (ref 0.8–1.2)
Prothrombin Time: 13.1 seconds (ref 11.4–15.2)

## 2022-03-23 LAB — LACTIC ACID, PLASMA: Lactic Acid, Venous: 1.2 mmol/L (ref 0.5–1.9)

## 2022-03-23 MED ORDER — LACTATED RINGERS IV BOLUS (SEPSIS)
1000.0000 mL | Freq: Once | INTRAVENOUS | Status: AC
  Administered 2022-03-23: 1000 mL via INTRAVENOUS

## 2022-03-23 MED ORDER — ACETAMINOPHEN 650 MG RE SUPP: 650.0000 mg | Freq: Four times a day (QID) | RECTAL | Status: DC | PRN

## 2022-03-23 MED ORDER — SODIUM CHLORIDE 0.9% FLUSH
3.0000 mL | Freq: Two times a day (BID) | INTRAVENOUS | Status: DC
  Administered 2022-03-23 – 2022-03-28 (×7): 3 mL via INTRAVENOUS

## 2022-03-23 MED ORDER — SODIUM CHLORIDE 0.9 % IV SOLN
400.0000 mg | Freq: Once | INTRAVENOUS | Status: AC
  Administered 2022-03-23: 400 mg via INTRAVENOUS
  Filled 2022-03-23: qty 40

## 2022-03-23 MED ORDER — MORPHINE SULFATE (PF) 2 MG/ML IV SOLN
1.0000 mg | INTRAVENOUS | Status: DC | PRN
  Administered 2022-03-24 – 2022-03-25 (×2): 1 mg via INTRAVENOUS
  Filled 2022-03-23 (×2): qty 1

## 2022-03-23 MED ORDER — LACTATED RINGERS IV SOLN: INTRAVENOUS | Status: DC

## 2022-03-23 MED ORDER — VANCOMYCIN HCL IN DEXTROSE 1-5 GM/200ML-% IV SOLN
1000.0000 mg | Freq: Once | INTRAVENOUS | Status: AC
  Administered 2022-03-23: 1000 mg via INTRAVENOUS
  Filled 2022-03-23: qty 200

## 2022-03-23 MED ORDER — PIPERACILLIN-TAZOBACTAM 3.375 G IVPB 30 MIN: 3.3750 g | Freq: Three times a day (TID) | INTRAVENOUS | Status: DC

## 2022-03-23 MED ORDER — ACETAMINOPHEN 325 MG PO TABS: 650.0000 mg | ORAL_TABLET | Freq: Four times a day (QID) | ORAL | Status: DC | PRN

## 2022-03-23 MED ORDER — VANCOMYCIN HCL IN DEXTROSE 1-5 GM/200ML-% IV SOLN
1000.0000 mg | INTRAVENOUS | Status: DC
  Administered 2022-03-24: 1000 mg via INTRAVENOUS
  Filled 2022-03-23: qty 200

## 2022-03-23 MED ORDER — DEXAMETHASONE SODIUM PHOSPHATE 10 MG/ML IJ SOLN
10.0000 mg | Freq: Once | INTRAMUSCULAR | Status: AC
  Administered 2022-03-23: 10 mg via INTRAVENOUS
  Filled 2022-03-23: qty 1

## 2022-03-23 MED ORDER — GADOBUTROL 1 MMOL/ML IV SOLN
5.0000 mL | Freq: Once | INTRAVENOUS | Status: AC | PRN
  Administered 2022-03-23: 5 mL via INTRAVENOUS

## 2022-03-23 MED ORDER — LEVETIRACETAM IN NACL 1000 MG/100ML IV SOLN
1000.0000 mg | Freq: Once | INTRAVENOUS | Status: AC
  Administered 2022-03-23: 1000 mg via INTRAVENOUS
  Filled 2022-03-23: qty 100

## 2022-03-23 MED ORDER — LEVETIRACETAM IN NACL 1500 MG/100ML IV SOLN
1500.0000 mg | Freq: Two times a day (BID) | INTRAVENOUS | Status: DC
  Administered 2022-03-23 – 2022-03-28 (×10): 1500 mg via INTRAVENOUS
  Filled 2022-03-23 (×11): qty 100

## 2022-03-23 MED ORDER — ONDANSETRON HCL 4 MG/2ML IJ SOLN: 4.0000 mg | Freq: Four times a day (QID) | INTRAMUSCULAR | Status: DC | PRN

## 2022-03-23 MED ORDER — SODIUM CHLORIDE 0.9 % IV SOLN
2.0000 g | Freq: Once | INTRAVENOUS | Status: AC
  Administered 2022-03-23: 2 g via INTRAVENOUS
  Filled 2022-03-23: qty 12.5

## 2022-03-23 MED ORDER — LEVETIRACETAM IN NACL 1500 MG/100ML IV SOLN
1500.0000 mg | Freq: Two times a day (BID) | INTRAVENOUS | Status: DC
  Filled 2022-03-23: qty 100

## 2022-03-23 MED ORDER — LORAZEPAM 2 MG/ML IJ SOLN
1.0000 mg | Freq: Once | INTRAMUSCULAR | Status: AC
  Administered 2022-03-23: 1 mg via INTRAVENOUS
  Filled 2022-03-23: qty 1

## 2022-03-23 MED ORDER — LORAZEPAM 2 MG/ML IJ SOLN: 1.0000 mg | INTRAMUSCULAR | Status: DC | PRN

## 2022-03-23 MED ORDER — ACETAMINOPHEN 650 MG RE SUPP
650.0000 mg | Freq: Once | RECTAL | Status: AC
  Administered 2022-03-23: 650 mg via RECTAL
  Filled 2022-03-23: qty 1

## 2022-03-23 MED ORDER — ONDANSETRON HCL 4 MG PO TABS: 4.0000 mg | ORAL_TABLET | Freq: Four times a day (QID) | ORAL | Status: DC | PRN

## 2022-03-23 MED ORDER — SODIUM CHLORIDE 0.9% FLUSH: 3.0000 mL | INTRAVENOUS | Status: DC | PRN

## 2022-03-23 MED ORDER — METRONIDAZOLE 500 MG/100ML IV SOLN
500.0000 mg | Freq: Once | INTRAVENOUS | Status: AC
  Administered 2022-03-23: 500 mg via INTRAVENOUS
  Filled 2022-03-23: qty 100

## 2022-03-23 MED ORDER — DEXAMETHASONE SODIUM PHOSPHATE 4 MG/ML IJ SOLN
4.0000 mg | Freq: Four times a day (QID) | INTRAMUSCULAR | Status: DC
  Administered 2022-03-23 – 2022-03-28 (×19): 4 mg via INTRAVENOUS
  Filled 2022-03-23 (×21): qty 1

## 2022-03-23 MED ORDER — SODIUM CHLORIDE 0.9 % IV SOLN
200.0000 mg | Freq: Two times a day (BID) | INTRAVENOUS | Status: DC
  Administered 2022-03-23 – 2022-03-28 (×10): 200 mg via INTRAVENOUS
  Filled 2022-03-23 (×11): qty 20

## 2022-03-23 MED ORDER — PIPERACILLIN-TAZOBACTAM 3.375 G IVPB
3.3750 g | Freq: Three times a day (TID) | INTRAVENOUS | Status: DC
  Administered 2022-03-23 – 2022-03-24 (×2): 3.375 g via INTRAVENOUS
  Filled 2022-03-23 (×2): qty 50

## 2022-03-23 NOTE — Consult Note (Signed)
Neurological consultation was requested for this patient for altered mental status and seizure. Please see HPI from the hospitalist. The patient's repeat MRI scan shows an extensive GBM which is much worse than her prior scan in spite of being on treatment. I discussed with the husband the goals of care and he would like her to be comfort measures only. She is currently on antiepileptics-continue home antiepileptics for comfort. I have notified the APP from the hospitalist service that the patient is going to be comfort measures only and they are going to put the appropriate order sets.  -- Amie [place], MD Neurologist [facility] Pager: [phone]

## 2022-03-23 NOTE — ED Triage Notes (Addendum)
Pt BIBA from home. [facility] Sepsis called. Pt has late stage brain cx. Was seen at hospital yesterday for seizure- no treatment was given dt advanced condition. Pt is confused at baseline- upon arrival eyes open to speech, is non-verbal, withdraws from pain.  Fever at home  BP: 180/78 HR: 114 SPO2: 88 RA CBG: 129 EtCO- 34

## 2022-03-23 NOTE — ED Notes (Signed)
Husband remains at bed side, Patient resting quietly, family denies any questions or concerns at this time.

## 2022-03-23 NOTE — Progress Notes (Signed)
A consult was received from an ED physician for Vanco, Cefepime per pharmacy dosing.  The patient's profile has been reviewed for ht/wt/allergies/indication/available labs.   A one time order has been placed for Vanco 1g x 1 and Cefepime 2g IV x1.  Further antibiotics/pharmacy consults should be ordered by admitting physician if indicated.                        Serafin Decatur S. Alford Highland, PharmD, BCPS Clinical Staff Pharmacist [email or web address]   Thank you, Wayland Salinas 03/23/2022  8:52 AM

## 2022-03-23 NOTE — Progress Notes (Incomplete)
       CROSS COVER NOTE  NAME: LAQUIDA COTRELL MRN: [id number] DOB : Nov 26, 1965    Date of Service   03/23/2022   Patient was admitted for sepsis due to undetermined organism and altered mental status secondary to seizure activity.  Patient has a significant history of glioblastoma that has progressively worsened over the last few months.  Neuroconsult appreciated.   HPI/Events of Note   Notified by neurologist Dr. Rory Percy via phone call regarding the patient's husband's wish to    Interventions/ Plan   X X X       Raenette Rover, DNP, [place]
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
## 2022-03-23 NOTE — Progress Notes (Addendum)
       CROSS COVER NOTE  NAME: Kristen Foley MRN: [id number] DOB : 03/18/1966    Date of Service   03/23/2022   Patient was admitted for sepsis due to undetermined organism and altered mental status secondary to seizure activity.  Patient has a significant history of glioblastoma that has progressively worsened over the last few months.  Neuroconsult appreciated.   HPI/Events of Note   I was notified via phone call by Dr. Rory Percy regarding change in goals of care.  Husband at bedside was notified by Dr. Rory Percy regarding the invasive progression of the GBM despite being on treatment.  The husband, Harrell Gave, would like the patient to be comfort measures only.  Dr. Rory Percy recommends admitting patient to [facility] and continuing comfort measures.   During my bedside assessment, patient is lying comfortably in bed.  Her eyes are closed and is currently asleep and snoring.  At this time, patient was not arousable to voice or touch.  Patient does not appear to be in any form of pain or discomfort.   Husband is at bedside.  He confirms that he would like the patient to be comfort measures only.  The husband has a good understanding that all curative treatment will be stopped and that the care the pt receives will focus on keeping her comfortable.  All questions were answered at this time.  Orders for comfort care will be placed.    Interventions/ Plan   Comfort care       Raenette Rover, DNP, [place]

## 2022-03-23 NOTE — ED Provider Notes (Signed)
[place] DEPT Provider Note   CSN: [id number] Arrival date & time: 03/23/22  6222     History  Chief Complaint  Patient presents with   Altered Mental Status   Code Sepsis    Kristen Foley is a 56 y.o. female.  Patient is a 56 year old female who is brought in with altered mental status.  History is obtained from chart and the EMS.  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  She is getting her care at [facility] oncology center.  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  Of note, she is on Keppra, Vimpat, and Fycompa for seizure management and is also on Decadron 2 mg twice daily chronically.  On chart review, it appears that she has some baseline receptive and expressive aphasia.  Today family found her nonresponsive.  She was also noted to be febrile.  No witnessed seizure activity although reported that she was seen at the hospital yesterday for seizures.         Home Medications Prior to Admission medications   Medication Sig Start Date End Date Taking? Authorizing Provider  acetaminophen (TYLENOL) 325 MG tablet Take 2 tablets (650 mg total) by mouth every 6 (six) hours as needed for mild pain (or Fever >/= 101). 12/03/21   Raiford Noble Latif, DO  Cholecalciferol (VITAMIN D3) 125 MCG (5000 UT) CAPS Take 10,000 Units by mouth 2 (two) times a week.    [provider]  clonazePAM (KLONOPIN) 0.5 MG tablet Take 0.5 mg by mouth as needed (seizure). >3 small seizures in a day 09/24/19   [provider]  dexamethasone (DECADRON) 2 MG tablet Take 2 mg by mouth 2 (two) times daily. 11/18/21   [provider]  FYCOMPA 4 MG TABS Take 4 mg by mouth at bedtime. 05/08/21   [provider]  gabapentin (NEURONTIN) 100 MG capsule Take  1 capsule  3 x /day 02/27/22   Unk Pinto, MD  GLEOSTINE 40 MG capsule Take 120 mg by mouth See  admin instructions. Every six weeks 11/08/21   [provider]  lacosamide (VIMPAT) 200 MG TABS tablet Take 1 tablet (200 mg total) by mouth 2 (two) times daily. 11/24/21 12/24/21  Antonieta Pert, MD  levETIRAcetam (KEPPRA) 100 MG/ML solution Take 1,500 mg by mouth 2 (two) times daily. 11/22/21   [provider]  LORazepam (ATIVAN) 0.5 MG tablet Take 0.5 mg by mouth every 4 (four) hours as needed for seizure.    [provider]  Midazolam (NAYZILAM) 5 MG/0.1ML SOLN Place 5 mg into the nose once. 12/09/19   [provider]  nystatin (MYCOSTATIN) 100000 UNIT/ML suspension Take 5 mLs (500,000 Units total) by mouth 4 (four) times daily. Swish and spit 12/07/21   Alycia Rossetti, NP  olmesartan (BENICAR) 40 MG tablet Take 1 tablet every Night  for BP 03/19/22   Unk Pinto, MD  ondansetron (ZOFRAN) 4 MG tablet Take 1 tablet (4 mg total) by mouth every 6 (six) hours as needed for nausea. 12/03/21   Raiford Noble Latif, DO  pantoprazole (PROTONIX) 40 MG tablet Take 40 mg by mouth daily.    [provider]  polyethylene glycol (MIRALAX / GLYCOLAX) 17 g packet Take 17 g by mouth 2 (two) times daily. 12/03/21   Raiford Noble Latif, DO      Allergies    Lexapro [escitalopram oxalate], Wellbutrin [bupropion],  and Zoloft [sertraline hcl]    Review of Systems   Review of Systems  Unable to perform ROS: Mental status change    Physical Exam Updated Vital Signs BP (!) 157/89   Pulse (!) 107   Temp (!) 103.3 F (39.6 C) (Rectal)   Resp (!) 27   Ht '5\' 3"'$  (1.6 m)   Wt 48.5 kg   SpO2 98%   BMI 18.94 kg/m  Physical Exam Constitutional:      Comments: Chronically ill-appearing  Eyes:     Pupils: Pupils are equal, round, and reactive to light.  Cardiovascular:     Rate and Rhythm: Tachycardia present.  Pulmonary:     Effort: Pulmonary effort is normal.     Breath sounds: Normal breath sounds.  Abdominal:     General: There is distension.     Tenderness: There  is abdominal tenderness (Patient winces on abdominal palpation).  Neurological:     Mental Status: She is alert.     Comments: Patient will open her eyes to verbal stimuli.  She will have some verbal response but is incomprehensible.  She is not following commands.  I cannot really assess whether she is moving her extremities symmetrically.  She did grip my hand with her left hand.  She has downgoing toes on Babinski testing.  No visualized seizure activity or eye twitching.     ED Results / Procedures / Treatments   Labs (all labs ordered are listed, but only abnormal results are displayed) Labs Reviewed  COMPREHENSIVE METABOLIC PANEL - Abnormal; Notable for the following components:      Result Value   Sodium 134 (*)    Glucose, Bld 108 (*)    BUN 25 (*)    Calcium 8.3 (*)    Total Protein 6.0 (*)    Albumin 3.0 (*)    All other components within normal limits  CBC WITH DIFFERENTIAL/PLATELET - Abnormal; Notable for the following components:   RBC 3.54 (*)    MCV 104.5 (*)    RDW 16.0 (*)    Platelets 103 (*)    nRBC 0.3 (*)    Neutro Abs 8.2 (*)    Lymphs Abs 0.5 (*)    Abs Immature Granulocytes 0.29 (*)    All other components within normal limits  CULTURE, BLOOD (ROUTINE X 2)  CULTURE, BLOOD (ROUTINE X 2)  RESP PANEL BY RT-PCR (RSV, FLU A&B, COVID)  RVPGX2  URINE CULTURE  LACTIC ACID, PLASMA  PROTIME-INR  LACTIC ACID, PLASMA  URINALYSIS, ROUTINE W REFLEX MICROSCOPIC  APTT  I-STAT BETA HCG BLOOD, ED ([facility], [facility], [facility] ONLY)    EKG EKG Interpretation  Date/Time:  Saturday March 23 2022 08:43:05 EST Ventricular Rate:  109 PR Interval:  129 QRS Duration: 93 QT Interval:  332 QTC Calculation: 447 R Axis:   43 Text Interpretation: Sinus tachycardia Probable left atrial enlargement since last tracing no significant change Confirmed by Malvin Johns [phone]) on 03/23/2022 9:03:42 AM  Radiology CT Head Wo Contrast  Result Date: 03/23/2022 CLINICAL DATA:  56 year old  female with infiltrative left hemisphere tumor. Altered mental status. EXAM: CT HEAD WITHOUT CONTRAST TECHNIQUE: Contiguous axial images were obtained from the base of the skull through the vertex without intravenous contrast. RADIATION DOSE REDUCTION: This exam was performed according to the departmental dose-optimization program which includes automated exposure control, adjustment of the mA and/or kV according to patient size and/or use of iterative reconstruction technique. COMPARISON:  Head CT 11/25/2021.  Brain MRI  11/23/2021. FINDINGS: Brain: Substantially progressed intracranial mass effect since August, including downward and rightward mass effect on the lateral ventricles and anterior frontal lobes. Suprasellar cistern now mostly effaced. Other basilar cisterns remain patent. Left lateral ventricle now mostly effaced. Right lateral ventricle is mildly trapped and dilated. Third ventricle effaced. Fourth ventricle is stable. Posterior fossa gray-white differentiation is stable. Estimated rightward midline shift of 7 mm. Progressed indistinct and mass-like hypodensity in the left frontal lobe. Compare series 4, image 15 to series 2, image 11 at that time. Hypodensity also now infiltrates the left deep gray nuclei there. No convincing acute intracranial hemorrhage is identified. No cortically based acute infarct identified. Vascular: No suspicious intracranial vascular hyperdensity. Skull: Previous burr hole, biopsy site series 5, image 22. No acute osseous abnormality identified. Sinuses/Orbits: Visualized paranasal sinuses and mastoids are stable and well aerated. Other: No acute orbit or scalp soft tissue finding. IMPRESSION: Evidence of substantial left hemisphere tumor progression since August. New intracranial mass effect with effaced left and trapped right lateral ventricles, effaced suprasellar cistern, estimated rightward midline shift of 7 mm. Electronically Signed   By: Genevie Ann M.D.   On:  03/23/2022 09:45   DG Chest Port 1 View  Result Date: 03/23/2022 CLINICAL DATA:  Questionable sepsis. EXAM: PORTABLE CHEST 1 VIEW COMPARISON:  11/30/2021 FINDINGS: Mild LEFT basilar opacities noted, favor atelectasis. The cardiomediastinal silhouette is unremarkable. There is no evidence of pneumothorax, pleural effusion, mass or acute bony abnormality. IMPRESSION: Mild LEFT basilar opacities, favor atelectasis. Electronically Signed   By: Margarette Canada M.D.   On: 03/23/2022 09:40   CT ABDOMEN PELVIS WO CONTRAST  Result Date: 03/23/2022 CLINICAL DATA:  56 year old female with altered mental status and code sepsis. EXAM: CT ABDOMEN AND PELVIS WITHOUT CONTRAST TECHNIQUE: Multidetector CT imaging of the abdomen and pelvis was performed following the standard protocol without IV contrast. RADIATION DOSE REDUCTION: This exam was performed according to the departmental dose-optimization program which includes automated exposure control, adjustment of the mA and/or kV according to patient size and/or use of iterative reconstruction technique. COMPARISON:  06/02/2019 CT FINDINGS: Please note that parenchymal and vascular abnormalities may be missed as intravenous contrast was not administered. Lower chest: Bilateral dependent/basilar opacities are noted, favor atelectasis. No other acute abnormalities are noted within the LOWER chest. Hepatobiliary: The liver is unremarkable. The gallbladder is mildly distended without CT evidence of acute cholecystitis. There is no evidence of intrahepatic or extrahepatic biliary dilatation. Pancreas: Unremarkable Spleen: Unremarkable Adrenals/Urinary Tract: The kidneys, adrenal glands and bladder are unremarkable. Stomach/Bowel: Stomach is within normal limits. Appendix appears normal. No evidence of bowel wall thickening, distention, or inflammatory changes. Vascular/Lymphatic: No significant vascular findings are present. No enlarged abdominal or pelvic lymph nodes.  Reproductive: No acute or significant abnormalities. Other: No ascites, focal collection or pneumoperitoneum. Musculoskeletal: No acute or suspicious bony abnormalities are noted. IMPRESSION: 1. Bilateral LOWER lung dependent/basilar opacities, favor atelectasis. 2. Mildly distended gallbladder without CT evidence of acute cholecystitis. No evidence of biliary dilatation. 3. No other acute or significant abnormalities identified. Electronically Signed   By: Margarette Canada M.D.   On: 03/23/2022 09:39    Procedures Procedures    Medications Ordered in ED Medications  lactated ringers infusion ( Intravenous New Bag/Given 03/23/22 0857)  lacosamide (VIMPAT) 400 mg in sodium chloride 0.9 % 25 mL IVPB (has no administration in time range)    Followed by  lacosamide (VIMPAT) 200 mg in sodium chloride 0.9 % 25 mL IVPB (has no administration  in time range)  levETIRAcetam (KEPPRA) IVPB 1000 mg/100 mL premix (has no administration in time range)  lactated ringers bolus 1,000 mL (0 mLs Intravenous Stopped 03/23/22 1050)  ceFEPIme (MAXIPIME) 2 g in sodium chloride 0.9 % 100 mL IVPB (0 g Intravenous Stopped 03/23/22 1011)  metroNIDAZOLE (FLAGYL) IVPB 500 mg (500 mg Intravenous New Bag/Given 03/23/22 1013)  vancomycin (VANCOCIN) IVPB 1000 mg/200 mL premix (0 mg Intravenous Stopped 03/23/22 1051)  levETIRAcetam (KEPPRA) IVPB 1000 mg/100 mL premix (0 mg Intravenous Stopped 03/23/22 1051)  acetaminophen (TYLENOL) suppository 650 mg (650 mg Rectal Given 03/23/22 1053)  LORazepam (ATIVAN) injection 1 mg (1 mg Intravenous Given 03/23/22 1016)  dexamethasone (DECADRON) injection 10 mg (10 mg Intravenous Given 03/23/22 1053)    ED Course/ Medical Decision Making/ A&P                           Medical Decision Making Amount and/or Complexity of Data Reviewed Labs: ordered. Radiology: ordered. ECG/medicine tests: ordered.  Risk OTC drugs. Prescription drug management. Decision regarding  hospitalization.   Patient is a 56 year old female who unfortunately has progressive glioblastoma.  She has chronic expressive and receptive aphasia and has limited vocabulary and ability to communicate at baseline per her husband who now is at bedside.  This morning she was less responsive.  She is normally awake and alert.  She was noted to have a fever this morning and had a temperature of 103.3 in the ED.  She was treated as septic protocol.  She was started on IV fluids and antibiotics for unknown source.  She had a head CT which showed progressive brain changes although the patient's husband says that they had recent imaging last month at [facility] that had also shown progressive changes so it is unclear how different today CT is from her baseline.  She had a CT of her abdomen pelvis which did not show any clear signs of infection.  There are some questionable pneumonia.  Chest x-ray was interpreted by me and confirmed by the radiologist to show also some questionable left basilar infiltrate versus atelectasis.  Her lactate is normal.  Her other labs are nonconcerning.  WBC count is normal.  She did have some noted focal seizure activity in her left arm.  She was given some Ativan and loaded with Keppra.  Her husband states that typically her seizures are focal and mostly on the right side although she has had some generalized tonic-clonic seizures on both sides.  I spoke to Dr.Bhagat with neurology.  She recommended loading the patient with 2 g of Keppra.  She also ordered IV Vimpat.  I added Decadron.  She recommended MRI with and without contrast once the patient is more stable to tolerate this.  Also recommended avoiding cefepime from here on out as it lowers the seizure threshold.  Will need a full neuro consult once admitted.  Recommends admission to [facility].  I spoke with Dr. Olevia Bowens who will admit the patient for further treatment.  I had a discussion with the patient's husband regarding her goals  of care.  The husband reports that they actually had signed the patient up for full hospice in November but the patient's mother had felt that the patient indicated that she wanted to continue treatment.  Therefore chemo treatments were restarted and patient was withdrawn from hospice care.  I did discuss with the husband what her end-of-life wishes are and at this point he feels  that she should be made a DNR.  However he is not sure whether or not ventilator management would be okay.  He wants to have further discussion regarding end-of-life issues with the patient's mother and uncle once they are able to arrive to the emergency department.  At this point, patient is DNR but not DNI.  CRITICAL CARE Performed by: Malvin Johns Total critical care time: 90 minutes Critical care time was exclusive of separately billable procedures and treating other patients. Critical care was necessary to treat or prevent imminent or life-threatening deterioration. Critical care was time spent personally by me on the following activities: development of treatment plan with patient and/or surrogate as well as nursing, discussions with consultants, evaluation of patient's response to treatment, examination of patient, obtaining history from patient or surrogate, ordering and performing treatments and interventions, ordering and review of laboratory studies, ordering and review of radiographic studies, pulse oximetry and re-evaluation of patient's condition.  Final Clinical Impression(s) / ED Diagnoses Final diagnoses:  Sepsis without acute organ dysfunction, due to unspecified organism ([place])  Seizures ([place])  Altered mental status, unspecified altered mental status type  Status epilepticus [facility])    Rx / DC Orders ED Discharge Orders     None         Malvin Johns, MD 03/23/22 1117

## 2022-03-23 NOTE — ED Notes (Signed)
Patient transported to CT 

## 2022-03-23 NOTE — Plan of Care (Signed)
Discussion initially with ED provider Dr. Tamera Punt and then with Dr. Olevia Bowens, approximately 20 minutes of discussion, no charge  This is a 56 year old followed at [facility] for glioblastoma who has been having disease progression.  Previously on hospice but then per report this was changed and she has been getting more aggressive treatment as able recently.  Home antiseizure medications include Vimpat 200 mg twice daily, Keppra 1500 mg twice daily, Fycompa 4 mg nightly, as well as dexamethasone 2 mg twice daily  Per Dr. Tamera Punt she missed several doses of her medications at least several doses of Vimpat, possibly this morning's dose of Keppra due to unresponsiveness, unclear missed doses of Fycompa.  She is also febrile with no identified source of infection at this time, workup pending.  Prior seizures have been on the right side but she is having left-sided twitching (hand and arm) concerning for possible seizure this morning.   She has had disease progression from her left-sided GBM now involving the right side as well on MRI from November 2023 (cannot review images as they are on the [employer] system).  At baseline she can only say a few words due to her GBM predominantly involving the left hemisphere  On Dr. Lavetta Nielsen evaluation, family is now declining NG tube placement for Fycompa; additionally patient has become increasingly sleepy  Reasons for breakthrough seizures include: -Infection -Missed medication -Disease progression  Recommendations: - Vimpat 400 mg x 1 then 200 mg BID home dose  - Keppra 2000 mg load, then continue 1500 mg BID, noting this is slightly above current recommendations based on renal function, may need to be adjusted if renal function continues to be low Estimated Creatinine Clearance: 62.3 mL/min (by C-G formula based on SCr of 0.74 mg/dL).   CrCl 80 to 130 mL/minute/1.73 m2: 500 mg to 1.5 g every 12 hours.  CrCl 50 to <80 mL/minute/1.73 m2: 500 mg to 1 g every 12 hours.  CrCl 30  to <50 mL/minute/1.73 m2: 250 to 750 mg every 12 hours.  CrCl 15 to <30 mL/minute/1.73 m2: 250 to 500 mg every 12 hours.  CrCl <15 mL/minute/1.73 m2: 250 to 500 mg every 24 hours (expert opinion). - Palliative care consultation  - MRI brain w/ and w/o; contact [facility] to [place] last MRI images for comparison if family does want continued further aggressive management - Fycompa if able, NGT if too sleepy and GoC allow - Avoid cefepime if possible due to lowering seizure threshold  - Dexamethasone per neurosurgery, agree with initial loading dose now noting home dose is 2 mg BID - Admit to [facility] for full neuro consult and continuous EEG if aggressive goals of care; but if family does not think patient would want interventions such as NG tube / intubation, not clear that EEG would change management; not clear that another agent such as phenobarbital can be safely added given her depressed mental status

## 2022-03-23 NOTE — Progress Notes (Signed)
Pharmacy Antibiotic Note  Kristen Foley is a 56 y.o. female admitted on 03/23/2022 with sepsis.  Pharmacy has been consulted for vancomycin dosing.  Plan: Zosyn per MD Vancomycin 1000 mg IV q24h  (SCr rounded 0.8, TBW < IBW, est AUC 510) Measure Vanc levels as needed.  Goal AUC = 400 - 550 Follow up renal function, culture results, and clinical course.   Height: '5\' 3"'$  (160 cm) Weight: 48.5 kg (106 lb 14.8 oz) IBW/kg (Calculated) : 52.4  Temp (24hrs), Avg:99.6 F (37.6 C), Min:97.8 F (36.6 C), Max:103.3 F (39.6 C)  Recent Labs  Lab 03/21/22 0000 03/23/22 0837  WBC 8.7 9.7  CREATININE 0.69 0.74  LATICACIDVEN  --  1.2    Estimated Creatinine Clearance: 62.3 mL/min (by C-G formula based on SCr of 0.74 mg/dL).    Allergies  Allergen Reactions   Gabapentin Other (See Comments)    Negative side effects   Lexapro [Escitalopram Oxalate] Other (See Comments)    "it was too strong for me"   Wellbutrin [Bupropion] Other (See Comments)    "it was too strong for me"   Zoloft [Sertraline Hcl] Other (See Comments)    "it was too strong for me"    Antimicrobials this admission: 12/16 Cefepime x1 12/16 metronidazole x1 12/16 Vancomycin >>  12/16 Zosyn >>   Microbiology results: 12/16 BCx:  12/16 UCx:   12/16 Sputum:   12/16 MRSA PCR:   Thank you for allowing pharmacy to be a part of this patient's care.  Gretta Arab PharmD, BCPS [facility] main pharmacy [phone] 03/23/2022 6:53 PM

## 2022-03-23 NOTE — ED Notes (Signed)
Family remains at bedside no questions or concerns at this time, patient resting with eyes closed

## 2022-03-24 ENCOUNTER — Other Ambulatory Visit: Payer: Self-pay

## 2022-03-24 DIAGNOSIS — A419 Sepsis, unspecified organism: Secondary | ICD-10-CM | POA: Diagnosis not present

## 2022-03-24 DIAGNOSIS — C719 Malignant neoplasm of brain, unspecified: Secondary | ICD-10-CM

## 2022-03-24 DIAGNOSIS — I10 Essential (primary) hypertension: Secondary | ICD-10-CM

## 2022-03-24 LAB — BLOOD CULTURE ID PANEL (REFLEXED) - BCID2

## 2022-03-24 LAB — COMPREHENSIVE METABOLIC PANEL
ALT: 33 U/L (ref 0–44)
AST: 31 U/L (ref 15–41)
Albumin: 2.1 g/dL — ABNORMAL LOW (ref 3.5–5.0)
Alkaline Phosphatase: 52 U/L (ref 38–126)
Anion gap: 10 (ref 5–15)
BUN: 24 mg/dL — ABNORMAL HIGH (ref 6–20)
CO2: 21 mmol/L — ABNORMAL LOW (ref 22–32)
Calcium: 8 mg/dL — ABNORMAL LOW (ref 8.9–10.3)
Chloride: 104 mmol/L (ref 98–111)
Creatinine, Ser: 0.68 mg/dL (ref 0.44–1.00)
GFR, Estimated: >60 mL/min (ref >60)
Glucose, Bld: 129 mg/dL — ABNORMAL HIGH (ref 70–99)
Potassium: 3.9 mmol/L (ref 3.5–5.1)
Sodium: 135 mmol/L (ref 135–145)
Total Bilirubin: 0.6 mg/dL (ref 0.3–1.2)
Total Protein: 4.9 g/dL — ABNORMAL LOW (ref 6.5–8.1)

## 2022-03-24 LAB — CBC
HCT: 33.9 % — ABNORMAL LOW (ref 36.0–46.0)
Hemoglobin: 11.4 g/dL — ABNORMAL LOW (ref 12.0–15.0)
MCH: 33.8 pg (ref 26.0–34.0)
MCHC: 33.6 g/dL (ref 30.0–36.0)
MCV: 100.6 fL — ABNORMAL HIGH (ref 80.0–100.0)
Platelets: 81 10*3/uL — ABNORMAL LOW (ref 150–400)
RBC: 3.37 MIL/uL — ABNORMAL LOW (ref 3.87–5.11)
RDW: 15.5 % (ref 11.5–15.5)
WBC: 9.2 10*3/uL (ref 4.0–10.5)
nRBC: 0 % (ref 0.0–0.2)

## 2022-03-24 MED ORDER — LIP MEDEX EX OINT
TOPICAL_OINTMENT | CUTANEOUS | Status: DC | PRN
  Filled 2022-03-24: qty 7

## 2022-03-24 NOTE — Plan of Care (Signed)
  Problem: Respiratory: Goal: Ability to maintain adequate ventilation will improve Outcome: Progressing   Problem: Pain Management: Goal: Satisfaction with pain management regimen will improve Outcome: Progressing   Problem: Safety: Goal: Ability to remain free from injury will improve Outcome: Progressing

## 2022-03-24 NOTE — Progress Notes (Signed)
  Transition of Care [facility]) Screening Note   Patient Details  Name: Kristen Foley Date of Birth: 12/18/1965   Transition of Care [facility]) CM/SW Contact:    Henrietta Dine, RN Phone Number: [phone] 03/24/2022, 3:21 PM  Transition of Care Department [facility]) has reviewed patient and no TOC needs have been identified at this time. We will continue to monitor patient advancement through interdisciplinary progression rounds. If new patient transition needs arise, please place a TOC consult.

## 2022-03-24 NOTE — Progress Notes (Signed)
[facility]                                                                               Kristen Foley, is a 56 y.o. female, DOB - 01/09/1966, GUR:[id number] Admit date - 03/23/2022    Outpatient Primary MD for the patient is Kristen Pinto, MD  LOS - 1  days    Brief summary     Kristen Foley is a 56 y.o. female with medical history significant of seasonal allergies, anemia, glioblastoma multiforme with expressive aphasia, tumor related seizures, hypertension, hyperlipidemia who was brought to the emergency department with fever, altered mental status in the setting of seizures.  Her glioblastoma has been progressively worse over the last few months.  Her husband stated that she was at baseline yesterday, but she became unresponsive.   Assessment & Plan    Assessment and Plan:  Sepsis of undetermined organism:  -unclear etiology Initially started on broad spectrum IV antibiotics.  Later on family transitioned her to comfort measures.   Seizures in the setting of progressive GBM and midline shift of brain.  - currently on IV anti seizure meds.  - family transitioned to comfort measures.  - she appears comfortable, but remains unresponsive.    Hypertension:  Well controlled.  .     Estimated body mass index is 18.94 kg/m as calculated from the following:   Height as of this encounter: '5\' 3"'$  (1.6 m).   Weight as of this encounter: 48.5 kg.  Code Status: DNR DVT Prophylaxis:  SCDs Start: 03/23/22 1213   Level of Care: Level of care: Palliative Care Family Communication: family at bedside.   Disposition Plan:     Remains inpatient appropriate:  transitioned to comfort measures.   Procedures:  None.   Consultants:   Neurology.   Antimicrobials:   Anti-infectives (From admission, onward)    Start     Dose/Rate Route Frequency Ordered Stop   03/24/22 0800  vancomycin (VANCOCIN) IVPB 1000 mg/200 mL premix  Status:  Discontinued        1,000  mg 200 mL/hr over 60 Minutes Intravenous Every 24 hours 03/23/22 1857 03/24/22 1002   03/23/22 2200  piperacillin-tazobactam (ZOSYN) IVPB 3.375 g  Status:  Discontinued        3.375 g 100 mL/hr over 30 Minutes Intravenous Every 8 hours 03/23/22 1828 03/23/22 1834   03/23/22 2200  piperacillin-tazobactam (ZOSYN) IVPB 3.375 g  Status:  Discontinued        3.375 g 12.5 mL/hr over 240 Minutes Intravenous Every 8 hours 03/23/22 1835 03/24/22 1002   03/23/22 0900  ceFEPIme (MAXIPIME) 2 g in sodium chloride 0.9 % 100 mL IVPB        2 g 200 mL/hr over 30 Minutes Intravenous  Once 03/23/22 0849 03/23/22 1011   03/23/22 0900  metroNIDAZOLE (FLAGYL) IVPB 500 mg        500 mg 100 mL/hr over 60 Minutes Intravenous  Once 03/23/22 0849 03/23/22 1124   03/23/22 0900  vancomycin (VANCOCIN) IVPB 1000 mg/200 mL premix        1,000 mg 200 mL/hr over 60 Minutes Intravenous  Once 03/23/22 0849 03/23/22 1051  Medications  Scheduled Meds:  dexamethasone (DECADRON) injection  4 mg Intravenous Q6H   sodium chloride flush  3 mL Intravenous Q12H   Continuous Infusions:  lacosamide (VIMPAT) IV 200 mg (03/24/22 1018)   levETIRAcetam 1,500 mg (03/24/22 1105)   PRN Meds:.acetaminophen **OR** acetaminophen, LORazepam, morphine injection, ondansetron **OR** ondansetron (ZOFRAN) IV, sodium chloride flush    Subjective:   Kristen Foley was seen and examined today.  Family at bedside.   Objective:   Vitals:   03/23/22 1746 03/23/22 2130 03/23/22 2157 03/23/22 2216  BP: (!) 149/86 (!) 162/95  (!) 168/90  Pulse: 99 93  (!) 101  Resp: (!) 25 (!) 25  (!) 24  Temp: 97.8 F (36.6 C)  (!) 100.7 F (38.2 C) 100.1 F (37.8 C)  TempSrc: Axillary  Axillary Oral  SpO2: 97% 97%  98%  Weight:      Height:        Intake/Output Summary (Last 24 hours) at 03/24/2022 1658 Last data filed at 03/24/2022 0800 Gross per 24 hour  Intake 2103.51 ml  Output 202 ml  Net 1901.51 ml   Filed Weights   03/23/22  0836  Weight: 48.5 kg     Exam General exam: Appears calm and comfortable  Respiratory system: rhonchorous sounds.  Cardiovascular system: S1 & S2 heard, RRR.  Gastrointestinal system: Abdomen is nondistended, soft and nontender. Central nervous system: sleeping soundly.  Extremities: no  edema.  Skin: No rashes, Psychiatry:  unable to assess.    Data Reviewed:  I have personally reviewed following labs and imaging studies   CBC Lab Results  Component Value Date   WBC 9.2 03/24/2022   RBC 3.37 (L) 03/24/2022   HGB 11.4 (L) 03/24/2022   HCT 33.9 (L) 03/24/2022   MCV 100.6 (H) 03/24/2022   MCH 33.8 03/24/2022   PLT 81 (L) 03/24/2022   MCHC 33.6 03/24/2022   RDW 15.5 03/24/2022   LYMPHSABS 0.5 (L) 03/23/2022   MONOABS 0.8 03/23/2022   EOSABS 0.0 03/23/2022   BASOSABS 0.0 32/44/0102     Last metabolic panel Lab Results  Component Value Date   NA 135 03/24/2022   K 3.9 03/24/2022   CL 104 03/24/2022   CO2 21 (L) 03/24/2022   BUN 24 (H) 03/24/2022   CREATININE 0.68 03/24/2022   GLUCOSE 129 (H) 03/24/2022   GFRNONAA >60 03/24/2022   GFRAA 117 10/02/2020   CALCIUM 8.0 (L) 03/24/2022   PHOS 3.8 12/03/2021   PROT 4.9 (L) 03/24/2022   ALBUMIN 2.1 (L) 03/24/2022   BILITOT 0.6 03/24/2022   ALKPHOS 52 03/24/2022   AST 31 03/24/2022   ALT 33 03/24/2022   ANIONGAP 10 03/24/2022    CBG (last 3)  No results for input(s): "GLUCAP" in the last 72 hours.    Coagulation Profile: Recent Labs  Lab 03/23/22 0837  INR 1.0     Radiology Studies: MR BRAIN W WO CONTRAST  Result Date: 03/23/2022 CLINICAL DATA:  Brain/CNS neoplasm. Assess initial response. Breakthrough seizures. No GBM. Fevers. EXAM: MRI HEAD WITHOUT AND WITH CONTRAST TECHNIQUE: Multiplanar, multiecho pulse sequences of the brain and surrounding structures were obtained without and with intravenous contrast. CONTRAST:  14m GADAVIST GADOBUTROL 1 MMOL/ML IV SOLN COMPARISON:  CT head without contrast  03/23/2022. MR head without and with contrast 11/23/2021. [facility] FINDINGS: Brain: There is significant progression of tumor since the August exam. Enhancing tumor crosses the anterior corpus callosum and extends into the right centrum semi  ovale. The right-sided enhancing component measures 2.7 x 3.7 x 1.0 cm (AP x CC x TR). Multiple susceptibility foci are present within the tumor both on the left and right. No acute hemorrhage is present. T2 and FLAIR signal measurements crossing midline now measure 9.1 x 7.1 x 7.7 cm. There is significant mass effect effacement of the left lateral ventricle midline shift is 11 mm just above the level of corpus callosum. Additional T2 and FLAIR hyperintensity extend posterior to the left lateral ventricle. T2 signal extends into the left cerebral peduncle and along the left side of the pons. No significant extra-axial fluid collection is present. Vascular: Flow is present in the major intracranial arteries. Skull and upper cervical spine: The craniocervical junction is normal. Upper cervical spine is within normal limits. Marrow signal is unremarkable. Sinuses/Orbits: Polyp or mucous retention cyst is present posterior left maxillary sinus. Mild fluid is present in the mastoid air cells bilaterally. Paranasal sinuses are otherwise clear. The globes and orbits are within normal limits. IMPRESSION: 1. Significant progression of tumor since the August exam. 2. Enhancing tumor crosses the anterior corpus callosum and extends into the right centrum semi ovale. Abnormal T2 signal extends posterior to the left lateral ventricle and into the left side of the brainstem 3. Multiple susceptibility foci within the tumor both on the left and right. 4. Significant mass effect with midline shift of 11 mm just above the level of corpus callosum. Electronically Signed   By: San Morelle M.D.   On: 03/23/2022 16:09   CT Head Wo Contrast  Result  Date: 03/23/2022 CLINICAL DATA:  56 year old female with infiltrative left hemisphere tumor. Altered mental status. EXAM: CT HEAD WITHOUT CONTRAST TECHNIQUE: Contiguous axial images were obtained from the base of the skull through the vertex without intravenous contrast. RADIATION DOSE REDUCTION: This exam was performed according to the departmental dose-optimization program which includes automated exposure control, adjustment of the mA and/or kV according to patient size and/or use of iterative reconstruction technique. COMPARISON:  Head CT 11/25/2021.  Brain MRI 11/23/2021. FINDINGS: Brain: Substantially progressed intracranial mass effect since August, including downward and rightward mass effect on the lateral ventricles and anterior frontal lobes. Suprasellar cistern now mostly effaced. Other basilar cisterns remain patent. Left lateral ventricle now mostly effaced. Right lateral ventricle is mildly trapped and dilated. Third ventricle effaced. Fourth ventricle is stable. Posterior fossa gray-white differentiation is stable. Estimated rightward midline shift of 7 mm. Progressed indistinct and mass-like hypodensity in the left frontal lobe. Compare series 4, image 15 to series 2, image 11 at that time. Hypodensity also now infiltrates the left deep gray nuclei there. No convincing acute intracranial hemorrhage is identified. No cortically based acute infarct identified. Vascular: No suspicious intracranial vascular hyperdensity. Skull: Previous burr hole, biopsy site series 5, image 22. No acute osseous abnormality identified. Sinuses/Orbits: Visualized paranasal sinuses and mastoids are stable and well aerated. Other: No acute orbit or scalp soft tissue finding. IMPRESSION: Evidence of substantial left hemisphere tumor progression since August. New intracranial mass effect with effaced left and trapped right lateral ventricles, effaced suprasellar cistern, estimated rightward midline shift of 7 mm.  Electronically Signed   By: Genevie Ann M.D.   On: 03/23/2022 09:45   DG Chest Port 1 View  Result Date: 03/23/2022 CLINICAL DATA:  Questionable sepsis. EXAM: PORTABLE CHEST 1 VIEW COMPARISON:  11/30/2021 FINDINGS: Mild LEFT basilar opacities noted, favor atelectasis. The cardiomediastinal silhouette is unremarkable. There is no evidence of pneumothorax, pleural effusion, mass or  acute bony abnormality. IMPRESSION: Mild LEFT basilar opacities, favor atelectasis. Electronically Signed   By: Margarette Canada M.D.   On: 03/23/2022 09:40   CT ABDOMEN PELVIS WO CONTRAST  Result Date: 03/23/2022 CLINICAL DATA:  56 year old female with altered mental status and code sepsis. EXAM: CT ABDOMEN AND PELVIS WITHOUT CONTRAST TECHNIQUE: Multidetector CT imaging of the abdomen and pelvis was performed following the standard protocol without IV contrast. RADIATION DOSE REDUCTION: This exam was performed according to the departmental dose-optimization program which includes automated exposure control, adjustment of the mA and/or kV according to patient size and/or use of iterative reconstruction technique. COMPARISON:  06/02/2019 CT FINDINGS: Please note that parenchymal and vascular abnormalities may be missed as intravenous contrast was not administered. Lower chest: Bilateral dependent/basilar opacities are noted, favor atelectasis. No other acute abnormalities are noted within the LOWER chest. Hepatobiliary: The liver is unremarkable. The gallbladder is mildly distended without CT evidence of acute cholecystitis. There is no evidence of intrahepatic or extrahepatic biliary dilatation. Pancreas: Unremarkable Spleen: Unremarkable Adrenals/Urinary Tract: The kidneys, adrenal glands and bladder are unremarkable. Stomach/Bowel: Stomach is within normal limits. Appendix appears normal. No evidence of bowel wall thickening, distention, or inflammatory changes. Vascular/Lymphatic: No significant vascular findings are present. No  enlarged abdominal or pelvic lymph nodes. Reproductive: No acute or significant abnormalities. Other: No ascites, focal collection or pneumoperitoneum. Musculoskeletal: No acute or suspicious bony abnormalities are noted. IMPRESSION: 1. Bilateral LOWER lung dependent/basilar opacities, favor atelectasis. 2. Mildly distended gallbladder without CT evidence of acute cholecystitis. No evidence of biliary dilatation. 3. No other acute or significant abnormalities identified. Electronically Signed   By: Margarette Canada M.D.   On: 03/23/2022 09:39       Hosie Poisson M.D. [facility] 03/24/2022, 4:58 PM  Available via Epic secure chat 7am-7pm After 7 pm, please refer to night coverage provider listed on amion.

## 2022-03-25 DIAGNOSIS — R531 Weakness: Secondary | ICD-10-CM | POA: Diagnosis not present

## 2022-03-25 DIAGNOSIS — Z515 Encounter for palliative care: Secondary | ICD-10-CM

## 2022-03-25 DIAGNOSIS — A419 Sepsis, unspecified organism: Secondary | ICD-10-CM | POA: Diagnosis not present

## 2022-03-25 DIAGNOSIS — Z7189 Other specified counseling: Secondary | ICD-10-CM | POA: Diagnosis not present

## 2022-03-25 DIAGNOSIS — I10 Essential (primary) hypertension: Secondary | ICD-10-CM | POA: Diagnosis not present

## 2022-03-25 DIAGNOSIS — C719 Malignant neoplasm of brain, unspecified: Secondary | ICD-10-CM | POA: Diagnosis not present

## 2022-03-25 LAB — CULTURE, BLOOD (ROUTINE X 2): Special Requests: ADEQUATE

## 2022-03-25 MED ORDER — MORPHINE SULFATE (PF) 2 MG/ML IV SOLN
1.0000 mg | INTRAVENOUS | Status: DC
  Administered 2022-03-25 – 2022-03-26 (×6): 1 mg via INTRAVENOUS
  Filled 2022-03-25 (×6): qty 1

## 2022-03-25 NOTE — Plan of Care (Signed)
  Problem: Safety: Goal: Ability to remain free from injury will improve Outcome: Progressing   Problem: Skin Integrity: Goal: Risk for impaired skin integrity will decrease Outcome: Progressing   
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
## 2022-03-25 NOTE — Progress Notes (Signed)
[facility]                                                                               Kristen Foley, is a 56 y.o. female, DOB - [birth year], [id number]:[id number] Admit date - 03/23/2022    Outpatient Primary MD for the patient is Unk Pinto, MD  LOS - 2  days    Brief summary     Kristen Foley is a 56 y.o. female with medical history significant of seasonal allergies, anemia, glioblastoma multiforme with expressive aphasia, tumor related seizures, hypertension, hyperlipidemia who was brought to the emergency department with fever, altered mental status in the setting of seizures.  Her glioblastoma has been progressively worse over the last few months.  Her husband stated that she was at baseline yesterday, but she became unresponsive.   Assessment & Plan    Assessment and Plan:  Sepsis of undetermined organism:  -unclear etiology Initially started on broad spectrum IV antibiotics.  Later on family transitioned her to comfort measures.    Seizures in the setting of progressive GBM and midline shift of brain.  - currently on IV anti seizure meds.  - family transitioned to comfort measures.  - she appears comfortable, but remains unresponsive.    Hypertension:  Well controlled.  .  Comfort measures:  Palliative care consulted for symptom management.  Continue with anti emetic regimen IV .  Started her on IV morphine scheduled.    Estimated body mass index is 18.94 kg/m as calculated from the following:   Height as of this encounter: '5\' 3"'$  (1.6 m).   Weight as of this encounter: 48.5 kg.  Code Status: DNR DVT Prophylaxis:     Level of Care: Level of care: Palliative Care Family Communication: family at bedside.   Disposition Plan:     Remains inpatient appropriate:  transitioned to comfort measures.   Procedures:  None.   Consultants:   Neurology.   Antimicrobials:   Anti-infectives (From admission, onward)    Start     Dose/Rate Route  Frequency Ordered Stop   03/24/22 0800  vancomycin (VANCOCIN) IVPB 1000 mg/200 mL premix  Status:  Discontinued        1,000 mg 200 mL/hr over 60 Minutes Intravenous Every 24 hours 03/23/22 1857 03/24/22 1002   03/23/22 2200  piperacillin-tazobactam (ZOSYN) IVPB 3.375 g  Status:  Discontinued        3.375 g 100 mL/hr over 30 Minutes Intravenous Every 8 hours 03/23/22 1828 03/23/22 1834   03/23/22 2200  piperacillin-tazobactam (ZOSYN) IVPB 3.375 g  Status:  Discontinued        3.375 g 12.5 mL/hr over 240 Minutes Intravenous Every 8 hours 03/23/22 1835 03/24/22 1002   03/23/22 0900  ceFEPIme (MAXIPIME) 2 g in sodium chloride 0.9 % 100 mL IVPB        2 g 200 mL/hr over 30 Minutes Intravenous  Once 03/23/22 0849 03/23/22 1011   03/23/22 0900  metroNIDAZOLE (FLAGYL) IVPB 500 mg        500 mg 100 mL/hr over 60 Minutes Intravenous  Once 03/23/22 0849 03/23/22 1124   03/23/22 0900  vancomycin (VANCOCIN) IVPB 1000 mg/200 mL  premix        1,000 mg 200 mL/hr over 60 Minutes Intravenous  Once 03/23/22 0849 03/23/22 1051        Medications  Scheduled Meds:  dexamethasone (DECADRON) injection  4 mg Intravenous Q6H    morphine injection  1 mg Intravenous Q4H   sodium chloride flush  3 mL Intravenous Q12H   Continuous Infusions:  lacosamide (VIMPAT) IV 200 mg (03/25/22 0914)   levETIRAcetam 1,500 mg (03/25/22 1026)   PRN Meds:.acetaminophen **OR** acetaminophen, lip balm, LORazepam, ondansetron **OR** ondansetron (ZOFRAN) IV, sodium chloride flush    Subjective:   [name] was seen and examined today.  Family at bedside. She appears comfortable.   Objective:   Vitals:   03/23/22 2130 03/23/22 2157 03/23/22 2216 03/24/22 1939  BP: (!) 162/95  (!) 168/90 (!) 170/94  Pulse: 93  (!) 101 95  Resp: (!) 25  (!) 24 20  Temp:  (!) 100.7 F (38.2 C) 100.1 F (37.8 C) 99.4 F (37.4 C)  TempSrc:  Axillary Oral Oral  SpO2: 97%  98% 95%  Weight:      Height:        Intake/Output  Summary (Last 24 hours) at 03/25/2022 1646 Last data filed at 03/25/2022 0600 Gross per 24 hour  Intake 290 ml  Output 1800 ml  Net -1510 ml    Filed Weights   03/23/22 0836  Weight: 48.5 kg     Exam  General exam: ill appearing lady  not in distress.  Neuro : she appears comfortable.    Data Reviewed:  I have personally reviewed following labs and imaging studies   CBC Lab Results  Component Value Date   WBC 9.2 03/24/2022   RBC 3.37 (L) 03/24/2022   HGB 11.4 (L) 03/24/2022   HCT 33.9 (L) 03/24/2022   MCV 100.6 (H) 03/24/2022   MCH 33.8 03/24/2022   PLT 81 (L) 03/24/2022   MCHC 33.6 03/24/2022   RDW 15.5 03/24/2022   LYMPHSABS 0.5 (L) 03/23/2022   MONOABS 0.8 03/23/2022   EOSABS 0.0 03/23/2022   BASOSABS 0.0 61/95/0932     Last metabolic panel Lab Results  Component Value Date   NA 135 03/24/2022   K 3.9 03/24/2022   CL 104 03/24/2022   CO2 21 (L) 03/24/2022   BUN 24 (H) 03/24/2022   CREATININE 0.68 03/24/2022   GLUCOSE 129 (H) 03/24/2022   GFRNONAA >60 03/24/2022   GFRAA 117 10/02/2020   CALCIUM 8.0 (L) 03/24/2022   PHOS 3.8 12/03/2021   PROT 4.9 (L) 03/24/2022   ALBUMIN 2.1 (L) 03/24/2022   BILITOT 0.6 03/24/2022   ALKPHOS 52 03/24/2022   AST 31 03/24/2022   ALT 33 03/24/2022   ANIONGAP 10 03/24/2022    CBG (last 3)  No results for input(s): "GLUCAP" in the last 72 hours.    Coagulation Profile: Recent Labs  Lab 03/23/22 0837  INR 1.0      Radiology Studies: No results found.     Hosie Poisson M.D. [facility] 03/25/2022, 4:46 PM  Available via Epic secure chat 7am-7pm After 7 pm, please refer to night coverage provider listed on amion.

## 2022-03-26 DIAGNOSIS — A419 Sepsis, unspecified organism: Secondary | ICD-10-CM | POA: Diagnosis not present

## 2022-03-26 DIAGNOSIS — C719 Malignant neoplasm of brain, unspecified: Secondary | ICD-10-CM | POA: Diagnosis not present

## 2022-03-26 DIAGNOSIS — R569 Unspecified convulsions: Secondary | ICD-10-CM

## 2022-03-26 DIAGNOSIS — Z7189 Other specified counseling: Secondary | ICD-10-CM

## 2022-03-26 DIAGNOSIS — Z515 Encounter for palliative care: Secondary | ICD-10-CM

## 2022-03-26 DIAGNOSIS — R52 Pain, unspecified: Secondary | ICD-10-CM

## 2022-03-26 DIAGNOSIS — R9089 Other abnormal findings on diagnostic imaging of central nervous system: Secondary | ICD-10-CM

## 2022-03-26 DIAGNOSIS — I10 Essential (primary) hypertension: Secondary | ICD-10-CM | POA: Diagnosis not present

## 2022-03-26 DIAGNOSIS — Z79899 Other long term (current) drug therapy: Secondary | ICD-10-CM

## 2022-03-26 DIAGNOSIS — R451 Restlessness and agitation: Secondary | ICD-10-CM

## 2022-03-26 MED ORDER — LORAZEPAM 2 MG/ML IJ SOLN: 1.0000 mg | INTRAMUSCULAR | Status: DC | PRN

## 2022-03-26 MED ORDER — MORPHINE SULFATE (PF) 2 MG/ML IV SOLN
1.0000 mg | INTRAVENOUS | Status: DC | PRN
  Administered 2022-03-26 – 2022-03-28 (×10): 1 mg via INTRAVENOUS
  Filled 2022-03-26 (×10): qty 1

## 2022-03-26 MED ORDER — LORAZEPAM 2 MG/ML PO CONC: 1.0000 mg | ORAL | Status: DC | PRN

## 2022-03-26 MED ORDER — MORPHINE SULFATE 10 MG/5ML PO SOLN
1.0000 mg | ORAL | Status: DC
  Administered 2022-03-26 – 2022-03-27 (×4): 1 mg via ORAL
  Filled 2022-03-26 (×4): qty 5

## 2022-03-26 NOTE — TOC Initial Note (Addendum)
Transition of Care [facility]) - Initial/Assessment Note    Patient Details  Name: Kristen Foley MRN: [id number] Date of Birth: 1966-03-06  [facility] CM/SW Contact:    Vassie Moselle, Marie Phone Number: 03/26/2022, 11:54 AM  Clinical Narrative:                 CSW spoke with pt's spouse to discuss pt returning home with hospice services. Pt's spouse shared he is not sure which hospice agency he would like to use yet and reports he plans on calling a few of his friends/contacts to discuss with them prior to choosing hospice agency. He also shares that he needs pt's mother and uncle to commit to assisting pt at home as well as he is unsure if he will be able to provide the full care pt will need. Pt's spouse agreeable to touch base with CSW again this PM to discuss decision for hospice referral.   Update 3:05pm- [facility] is to meet with pt's family tomorrow to discuss home hospice vs [employer]. CSW spoke with pt's spouse to confirm this plan. TOC will continue to follow for outcome of meeting and discharge planning.    Expected Discharge Plan: Home w Hospice Care Barriers to Discharge: No Barriers Identified   Patient Goals and CMS Choice Patient states their goals for this hospitalization and ongoing recovery are:: For pt to receive hospice services CMS Medicare.gov Compare Post Acute Care list provided to:: Other (Comment Required) (spouse) Choice offered to / list presented to : Spouse  Expected Discharge Plan and Services Expected Discharge Plan: Home w Hospice Care In-house Referral: Clinical Social Work, Hospice / Palliative Care Discharge Planning Services: CM Consult Post Acute Care Choice: Hospice Living arrangements for the past 2 months: Single Family Home                 DME Arranged: N/A DME Agency: NA                  Prior Living Arrangements/Services Living arrangements for the past 2 months: Single Family Home Lives with::  Spouse Patient language and need for interpreter reviewed:: Yes Do you feel safe going back to the place where you live?: Yes      Need for Family Participation in Patient Care: Yes (Comment) Care giver support system in place?: No (comment)   Criminal Activity/Legal Involvement Pertinent to Current Situation/Hospitalization: No - Comment as needed  Activities of Daily Living Home Assistive Devices/Equipment: Bedside commode/3-in-1, Walker (specify type) ADL Screening (condition at time of admission) Patient's cognitive ability adequate to safely complete daily activities?: No Is the patient deaf or have difficulty hearing?: No Does the patient have difficulty seeing, even when wearing glasses/contacts?: No Does the patient have difficulty concentrating, remembering, or making decisions?: Yes Patient able to express need for assistance with ADLs?: No Does the patient have difficulty dressing or bathing?: Yes Independently performs ADLs?: No Communication: Dependent Is this a change from baseline?: Change from baseline, expected to last >3 days Dressing (OT): Dependent Is this a change from baseline?: Change from baseline, expected to last >3 days Grooming: Dependent Is this a change from baseline?: Change from baseline, expected to last >3 days Feeding: Dependent Is this a change from baseline?: Change from baseline, expected to last >3 days Bathing: Dependent Is this a change from baseline?: Change from baseline, expected to last >3 days Toileting: Dependent Is this a change from baseline?: Change from baseline, expected to last >3days  In/Out Bed: Dependent Is this a change from baseline?: Change from baseline, expected to last >3 days Walks in Home: Needs assistance Is this a change from baseline?: Change from baseline, expected to last >3 days Does the patient have difficulty walking or climbing stairs?: Yes Weakness of Legs: Right (PER HUSBAND) Weakness of Arms/Hands: Right  (PER HUSBAND)  Permission Sought/Granted   Permission granted to share information with : No              Emotional Assessment   Attitude/Demeanor/Rapport: Unable to Assess Affect (typically observed): Unable to Assess   Alcohol / Substance Use: Not Applicable Psych Involvement: No (comment)  Admission diagnosis:  Status epilepticus ([place]) [G40.901] Seizures ([place]) [R56.9] Sepsis due to undetermined organism ([place]) [A41.9] Altered mental status, unspecified altered mental status type [R41.82] Sepsis without acute organ dysfunction, due to unspecified organism [facility]) [A41.9] Patient Active Problem List   Diagnosis Date Noted   Palliative care encounter 03/26/2022   Goals of care, counseling/discussion 03/26/2022   Counseling and coordination of care 03/26/2022   High risk medication use 03/26/2022   Pain 03/26/2022   Agitation 03/26/2022   Sepsis due to undetermined organism ([place]) 03/23/2022   SIRS (systemic inflammatory response syndrome) ([place]) 03/23/2022   Midline shift of brain 03/23/2022   Thrombocytopenia ([place]) 03/23/2022   Sepsis ([place]) 11/25/2021   CAP (community acquired pneumonia) 11/25/2021   Essential hypertension 11/25/2021   GBM (glioblastoma multiforme) ([place]) 11/23/2021   Seizure ([place]) 11/23/2021   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 [place] [address]Phone: [phone]: [phone]    Social Determinants of Health (SDOH) Interventions    Readmission Risk Interventions    11/30/2021   12:57 PM  Readmission Risk Prevention Plan  Post Dischage Appt Complete  Medication Screening Complete  Transportation Screening Complete

## 2022-03-26 NOTE — Progress Notes (Signed)
[facility]                                                                               Kristen Foley, is a 56 y.o. female, DOB - 1965-12-22, WJX:[id number] Admit date - 03/23/2022    Outpatient Primary MD for the patient is Kristen Pinto, MD  LOS - 3  days    Brief summary     Kristen Foley is a 56 y.o. female with medical history significant of seasonal allergies, anemia, glioblastoma multiforme with expressive aphasia, tumor related seizures, hypertension, hyperlipidemia who was brought to the emergency department with fever, altered mental status in the setting of seizures.  Her glioblastoma has been progressively worse over the last few months.  Her husband stated that she was at baseline just before the day of admission, but she became unresponsive and was brought to ED for further evaluation. Neurology consulted . In view of her poor clinical status, progression of disease, she was transitioned to comfort measures. Palliative care consulted for symptom management. Plan is for home hospice in the next 24 to 48 hours.   Assessment & Plan    Assessment and Plan:  Sepsis of undetermined organism:  Initially started on broad spectrum IV antibiotics.  Later on family transitioned her to comfort measures.    Seizures in the setting of progressive GBM and midline shift of brain.  - currently on IV anti seizure meds.  - family transitioned to comfort measures.  - she appears comfortable, opening eyes sometimes.  - transition to oral /liquid epileptic medications tomorrow and possible d.c home with hospice.   Hypertension:    .  Comfort measures:  Palliative care consulted for symptom management.  Continue with anti emetic regimen IV .  Started her on IV morphine scheduled.    Estimated body mass index is 18.94 kg/m as calculated from the following:   Height as of this encounter: '5\' 3"'$  (1.6 m).   Weight as of this encounter: 48.5 kg.  Code Status:  DNR DVT Prophylaxis:  comfort measures.    Level of Care: Level of care: Palliative Care Family Communication: family at bedside.   Disposition Plan:     Remains inpatient appropriate:  transitioned to comfort measures.   Procedures:  None.   Consultants:   Neurology.  Palliative care consult.   Antimicrobials:   Anti-infectives (From admission, onward)    Start     Dose/Rate Route Frequency Ordered Stop   03/24/22 0800  vancomycin (VANCOCIN) IVPB 1000 mg/200 mL premix  Status:  Discontinued        1,000 mg 200 mL/hr over 60 Minutes Intravenous Every 24 hours 03/23/22 1857 03/24/22 1002   03/23/22 2200  piperacillin-tazobactam (ZOSYN) IVPB 3.375 g  Status:  Discontinued        3.375 g 100 mL/hr over 30 Minutes Intravenous Every 8 hours 03/23/22 1828 03/23/22 1834   03/23/22 2200  piperacillin-tazobactam (ZOSYN) IVPB 3.375 g  Status:  Discontinued        3.375 g 12.5 mL/hr over 240 Minutes Intravenous Every 8 hours 03/23/22 1835 03/24/22 1002   03/23/22 0900  ceFEPIme (MAXIPIME) 2 g in sodium chloride 0.9 %  100 mL IVPB        2 g 200 mL/hr over 30 Minutes Intravenous  Once 03/23/22 0849 03/23/22 1011   03/23/22 0900  metroNIDAZOLE (FLAGYL) IVPB 500 mg        500 mg 100 mL/hr over 60 Minutes Intravenous  Once 03/23/22 0849 03/23/22 1124   03/23/22 0900  vancomycin (VANCOCIN) IVPB 1000 mg/200 mL premix        1,000 mg 200 mL/hr over 60 Minutes Intravenous  Once 03/23/22 0849 03/23/22 1051        Medications  Scheduled Meds:  dexamethasone (DECADRON) injection  4 mg Intravenous Q6H   morphine  1 mg Oral Q4H   sodium chloride flush  3 mL Intravenous Q12H   Continuous Infusions:  lacosamide (VIMPAT) IV 200 mg (03/26/22 2147)   levETIRAcetam 1,500 mg (03/26/22 2100)   PRN Meds:.acetaminophen **OR** acetaminophen, lip balm, LORazepam, LORazepam, morphine injection, ondansetron **OR** ondansetron (ZOFRAN) IV, sodium chloride flush    Subjective:   Kristen Foley was  seen and examined today.  She appears comfortable.   Objective:   Vitals:   03/23/22 2216 03/24/22 1939 03/25/22 2225 03/26/22 1959  BP: (!) 168/90 (!) 170/94 (!) 146/81 (!) 157/118  Pulse: (!) 101 95 69 85  Resp: (!) '24 20 17 16  '$ Temp: 100.1 F (37.8 C) 99.4 F (37.4 C) 98.2 F (36.8 C) 98.4 F (36.9 C)  TempSrc: Oral Oral  Oral  SpO2: 98% 95% 95% 95%  Weight:      Height:        Intake/Output Summary (Last 24 hours) at 03/26/2022 2349 Last data filed at 03/26/2022 0746 Gross per 24 hour  Intake --  Output 150 ml  Net -150 ml    Filed Weights   03/23/22 0836  Weight: 48.5 kg     Exam  General exam: Appears calm and comfortable  Respiratory system: Air entry fair.  Cardiovascular system: S1 & S2 heard, RRR.  Gastrointestinal system: Abdomen is soft. Central nervous system: mostly unresponsive, family reports she is opening eyes sometimes and mumbling.  Extremities: no cyanosis.  Skin: No rashes,    Data Reviewed:  I have personally reviewed following labs and imaging studies   CBC Lab Results  Component Value Date   WBC 9.2 03/24/2022   RBC 3.37 (L) 03/24/2022   HGB 11.4 (L) 03/24/2022   HCT 33.9 (L) 03/24/2022   MCV 100.6 (H) 03/24/2022   MCH 33.8 03/24/2022   PLT 81 (L) 03/24/2022   MCHC 33.6 03/24/2022   RDW 15.5 03/24/2022   LYMPHSABS 0.5 (L) 03/23/2022   MONOABS 0.8 03/23/2022   EOSABS 0.0 03/23/2022   BASOSABS 0.0 29/92/4268     Last metabolic panel Lab Results  Component Value Date   NA 135 03/24/2022   K 3.9 03/24/2022   CL 104 03/24/2022   CO2 21 (L) 03/24/2022   BUN 24 (H) 03/24/2022   CREATININE 0.68 03/24/2022   GLUCOSE 129 (H) 03/24/2022   GFRNONAA >60 03/24/2022   GFRAA 117 10/02/2020   CALCIUM 8.0 (L) 03/24/2022   PHOS 3.8 12/03/2021   PROT 4.9 (L) 03/24/2022   ALBUMIN 2.1 (L) 03/24/2022   BILITOT 0.6 03/24/2022   ALKPHOS 52 03/24/2022   AST 31 03/24/2022   ALT 33 03/24/2022   ANIONGAP 10 03/24/2022    CBG (last  3)  No results for input(s): "GLUCAP" in the last 72 hours.    Coagulation Profile: Recent Labs  Lab 03/23/22 0837  INR 1.0  Radiology Studies: No results found.     Hosie Poisson M.D. [facility] 03/26/2022, 11:49 PM  Available via Epic secure chat 7am-7pm After 7 pm, please refer to night coverage provider listed on amion.

## 2022-03-26 NOTE — Progress Notes (Signed)
Manufacturing engineer [facility]) Hospital Liaison Note  Referral received for patient/family interest in hospice services.   Plan is for [facility] liaison to meet with [name] tomorrow at 9:30am to discuss hospice services.   Please call with any questions or concerns. Thank you  Roselee Nova, [place] [facility] Liaison [phone]

## 2022-03-26 NOTE — Progress Notes (Signed)
Daily Progress Note   Patient Name: Kristen Foley       Date: 03/26/2022 DOB: 1965/07/21  Age: 56 y.o. MRN#: [id number] Attending Physician: Hosie Poisson, MD Primary Care Physician: Unk Pinto, MD Admit Date: 03/23/2022 Length of Stay: 3 days  Reason for Consultation/Follow-up: Establishing goals of care and symptom management  Subjective:   CC: Patient resting comfortably this morning when seen following up regarding complex medical decision making and symptom management.  Subjective:  Extensive review of EMR prior to seeing patient.  Patient was seen by palliative provider Dr. Rowe Pavy on 03/25/2022.  Presented to bedside today and introduced myself as a colleague of Dr. Inda Castle.  Present at patient's bedside was patient's mother, uncle, and aunt.  Patient laying comfortably in bed.  Family able to update me that patient had just had a wonderful conversation with them and was asking for water.  They noted patient was interactive like she was prior to her seizure on Friday/Saturday.  Encouraged visiting during this time.  Patient was sleepy when I saw her as she had just received her Vimpat which makes her tired.  Inquired if discussions had been had about further neck steps in care and mother noted they had had not as it upsets patient.  With permission able to step outside with patient's uncle and mother to further discuss next steps in care.  ------------------------------------------------------------------------------------------------------------- Advance Care Planning Conversation  Pertinent diagnosis: GBM with expressive aphasia and worsening edema affect on imaging  The patient and/or family consented to a voluntary [place]. Individuals present for the conversation: Patient sleeping due to effects of medication and unable to participate in complex medical decision making.  Spoke with patient's mother, uncle, and significant other.  This palliative  provider was present for entirety of conversations.  Summary of the conversation:  Had extensive conversation with patient's mother and uncle.  Discussed patient's underlying disease and they were able to provide great detail regarding patient's cancer therapies.  Patient had last received Avastin therapy on Friday at [facility].  Mother informed to this provider that patient had been refusing her Vimpat medication at home as it makes her feel bad so it is likely she missed multiple doses before she had the seizure.  They noted that patient has been sleeping essentially since the seizure though woke up this morning as if nothing it happened.  Noted she is back on the anti seizure medications to help manage this and likely had a postictal state in addition to her cancer complications such as edema with mass effect in her brain, which she is also on steroids for management of.  Family informing that prior to this "event", patient had been wheelchair-bound but was interactive, feeding herself, participating in ADLs/some IADLs, and "enjoying life". Inquired if patient had ever discussed where she would want to receive end-of-life care.  Mother noted that it was very difficult for patient to discuss this as she did not want to "give up".  Patient had hospice at home for essentially 3 to 4 days though that was changed when patient wanted to seek more therapy at [facility].  Mother even had questions regarding feeding tubes and IV fluids which I discussed can actually cause more symptomatic burden in the end of life which mother agrees patient would not want.  Now it is clear that goals are to focus on patient's comfort and enjoying time with family at home if able. Spent time explaining the philosophy of hospice care.  Discussed avenues  in which hospice care can be provided such as primarily in the home are at an inpatient hospice unit if patient has symptom management needs to require that level of care.  Patient's current lab  work, vital signs, and her functional status prior to this seizure, it is possible that patient has the potential to have quality time at home.  Discussed possibility of weeks to short months if patient's symptoms can be managed.  Family agreed that patient would want to be at home if she is able to have her symptoms manage there.  Family agreeing with trying to get patient home with hospice though noted they deferred to patient's significant other of 18+ years, Kristen Foley.  Noted would call Kristen Foley at this time and discussed this plan with him as well.  After visiting with family, able to call patient's significant other Kristen Foley.  Introduced myself and the role as a palliative care team.  Updated him as I had other family about patient's possible prognosis if symptoms are well-managed.  Discussed the idea of home with hospice.  Significant other agreed patient would want to be at home to enjoy time there if able.  Noted it is not a guarantee that we would work to get her on oral medication such as liquids or small pills that could be given at home to help manage her symptoms to maximize time there.  Did note that should patient's symptoms worsen and are unable to be controlled in the home setting, sometimes patients need to be escalated to an inpatient hospice unit if they qualify.  Discussed this as well.  Spent time answering questions.  Significant other also agreeing that patient would want to be home with hospice so we will work towards this goal.  Outcome of the conversations and/or documents completed:  Planning to transition patient to oral medications to determine if could get appropriate symptom management, so that she could get quality time at home with home hospice support.  I spent 29 minutes providing separately identifiable ACP services with the patient and/or surrogate decision maker in a voluntary, in-person conversation discussing the patient's wishes and goals as detailed in the above  note.  Chelsea Aus, DO Palliative Care Provider  -------------------------------------------------------------------------------------------------------------  Informed care team members about [facility] to get home with hospice if symptoms can be well-managed.  Review of Systems  Objective:   Vital Signs:  BP (!) 146/81 (BP Location: Right Arm)   Pulse 69   Temp 98.2 F (36.8 C)   Resp 17   Ht '5\' 3"'$  (1.6 m)   Wt 48.5 kg   SpO2 95%   BMI 18.94 kg/m   Physical Exam: General: Chronically ill-appearing, sleeping, NAD Eyes: No drainage noted HENT: moist mucous membranes Cardiovascular: RRR Respiratory: increased work of breathing noted, not in respiratory distress Abdomen: slightly distended Skin: no rashes or lesions on visible skin Neuro: sleeping comfortably   Imaging:  I personally reviewed recent imaging.   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  Patient admitted with fever altered mental status in the setting of seizures, glioblastoma progressively worse over the last few months.  Patient admitted to hospital medicine service, transition to comfort measures by primary service, palliative medicine team consulted for ongoing comfort measures, patient remains admitted for seizures in the setting of progressive GBM midline shift of brain.  Recommendations/Plan: # Complex medical decision making/goals of care:  - Patient  sleeping comfortably when seen after receiving anti seizure medication.  Patient sleeping comfortably when seen after receiving anti seizure medication.  -Extensive conversations with family and significant other as documented in detail above in HPI.  All family agree that patient would want to be home to allow for quality time there if she is able to do so.  Hoping to transition to oral medications to be a  liquid or small pills so that symptoms could be managed at home to allow time with family there.  Family agreeing with addition of home hospice support to focus on patient's comfort in the setting of end-of-life care.  -Consulted TOC to coordinate with potentially getting patient home with home hospice support.  -  Code Status: DNR Prognosis: weeks to short months unless acute event such as aspiration occurs. Patient functional in wheelchair, completing ADLs, good oral intake prior to seizure activity on Friday/Saturday which lead to post-ictal state and unresponsiveness. Symptoms better managed at this time allowing for patient to interact more. Appropriate for hospice admission based on current prognosis.   # Symptom management:  -Pain/Dyspnea, in the setting of GBM and end-of-life care                        -Changed to morphine 1 mg solution every 4 hours scheduled.  Continue to adjust based on patient's symptom burden.            -Adjusted IV morphine to 1 mg every 2 hours as needed severe pain or dyspnea.                 -Anxiety/agitation, in the setting of GBM and end-of-life care                               -Has liquid/IV Ativan '1mg'$  every 4 hours as needed. Continue to adjust based on patient's symptom burden.                                               -Seizures,  in the setting of GBM and end-of-life care    -If patient is needing medication for breakthrough seizure management, could consider scheduling dose of Ativan every 4-6 hours based on symptom burden.   -Appreciate hospitalist/pharmacy's assistance in transitioning anti-seizure medications to liquids/small pills.   # Psychosocial Support:  -Extensive discussions with multiple family members as documented above.  # Discharge Planning: Working to see if patient can get home with hospice on oral medications for symptom management.  Discussed with: Hospitalist, Physicist, medical, RN, multiple family members  Thank you for  allowing the palliative care team to participate in the care Kristen Foley.  Chelsea Aus, DO Palliative Care Provider PMT # [phone]  If patient remains symptomatic despite maximum doses, please call PMT at [phone] between 0700 and 1900. Outside of these hours, please call attending, as PMT does not have night coverage.

## 2022-03-27 DIAGNOSIS — R6510 Systemic inflammatory response syndrome (SIRS) of non-infectious origin without acute organ dysfunction: Secondary | ICD-10-CM | POA: Diagnosis not present

## 2022-03-27 DIAGNOSIS — C710 Malignant neoplasm of cerebrum, except lobes and ventricles: Secondary | ICD-10-CM

## 2022-03-27 NOTE — Progress Notes (Signed)
Manufacturing engineer [facility]) Hospital Liaison Note  Bed offered and accepted for transfer to [facility]. Plan is to transfer tomorrow.   Please call with any questions or concerns. Thank you  Roselee Nova, [place] Hospital Liaison [phone]

## 2022-03-27 NOTE — TOC Progression Note (Signed)
Transition of Care [facility]) - Progression Note    Patient Details  Name: Kristen Foley MRN: [id number] Date of Birth: 07/16/1965  Transition of Care [facility]) CM/SW [place], [place] Phone Number: 03/27/2022, 1:26 PM  Clinical Narrative:    Pt's spouse to visit [facility] prior to making decision to transfer pt. TOC will continue to follow      Barriers to Discharge: No Barriers Identified  Expected Discharge Plan and Services In-house Referral: Clinical Social Work, Hospice / Palliative Care Discharge Planning Services: CM Consult Post Acute Care Choice: Hospice Living arrangements for the past 2 months: Single Family Home                 DME Arranged: N/A DME Agency: NA                   Social Determinants of Health (SDOH) Interventions SDOH Screenings   Food Insecurity: No Food Insecurity (03/24/2022)  Housing: Low Risk  (03/24/2022)  Transportation Needs: No Transportation Needs (03/24/2022)  Utilities: Not At Risk (03/24/2022)  Depression (PHQ2-9): Low Risk  (02/27/2022)  Tobacco Use: Low Risk  (03/23/2022)    Readmission Risk Interventions    11/30/2021   12:57 PM  Readmission Risk Prevention Plan  Post Dischage Appt Complete  Medication Screening Complete  Transportation Screening Complete

## 2022-03-27 NOTE — Progress Notes (Signed)
[facility] Progress Note  Patient: Kristen Foley     IWP:[id number]  DOA: 03/23/2022   PCP: Unk Pinto, MD       Brief hospital course: This is a 56 year old female with glioblastoma multiforme, seizures, expressive aphasia who presented to the ED with fever and altered mental status.  According to her husband, the patient became unresponsive the day prior to being brought into the hospital.  Apparently, she had been missing her antiseizure medications at home.  In the ED, she was felt to have sepsis but also suspected to possibly have had seizures and she was ordered IV antibiotics and antiepileptics.  An MRI revealed progression of the glioblastoma and she was transitioned to comfort care  Subjective:  Somnolent  Assessment and Plan: Principal Problem:   SIRS (systemic inflammatory response syndrome) (HCC) Active Problems:   GBM (glioblastoma multiforme) (HCC)   Seizures (HCC)   Hypoalbuminemia   Essential hypertension   Midline shift of brain   Thrombocytopenia ([place])   Palliative care encounter   Goals of care, counseling/discussion   Counseling and coordination of care   High risk medication use   Pain   Agitation  -Currently on comfort measures and receiving as needed morphine, Decadron and IV Vimpat and Keppra - Her family is considering transitioning her to [employer]     Code Status: DNR Consultants: Neurology, palliative care Level of Care: Level of care: Palliative Care Total time on patient care: 30 minutes DVT prophylaxis:  None-comfort care     Objective:   Vitals:   03/23/22 2216 03/24/22 1939 03/25/22 2225 03/26/22 1959  BP: (!) 168/90 (!) 170/94 (!) 146/81 (!) 157/118  Pulse: (!) 101 95 69 85  Resp: (!) '24 20 17 16  '$ Temp: 100.1 F (37.8 C) 99.4 F (37.4 C) 98.2 F (36.8 C) 98.4 F (36.9 C)  TempSrc: Oral Oral  Oral  SpO2: 98% 95% 95% 95%  Weight:      Height:       Filed Weights   03/23/22 0836  Weight: 48.5 kg    Exam: General exam: Appears comfortable  HEENT: oral mucosa moist Respiratory system: Clear to auscultation.  Cardiovascular system: S1 & S2 heard  Gastrointestinal system: Abdomen soft, non-tender, nondistended. Normal bowel sounds   Extremities: No cyanosis, clubbing or edema      CBC: Recent Labs  Lab 03/21/22 0000 03/23/22 0837 03/24/22 0643  WBC 8.7 9.7 9.2  NEUTROABS 7,795 8.2*  --   HGB 11.2* 12.0 11.4*  HCT 32.7* 37.0 33.9*  MCV 100.0 104.5* 100.6*  PLT 129* 103* 81*   Basic Metabolic Panel: Recent Labs  Lab 03/21/22 0000 03/23/22 0837 03/24/22 0643  NA 141 134* 135  K 4.4 3.9 3.9  CL 105 102 104  CO2 29 24 21*  GLUCOSE 85 108* 129*  BUN 28* 25* 24*  CREATININE 0.69 0.74 0.68  CALCIUM 8.5* 8.3* 8.0*   GFR: Estimated Creatinine Clearance: 62.3 mL/min (by C-G formula based on SCr of 0.68 mg/dL).  Scheduled Meds:  dexamethasone (DECADRON) injection  4 mg Intravenous Q6H   morphine  1 mg Oral Q4H   sodium chloride flush  3 mL Intravenous Q12H   Continuous Infusions:  lacosamide (VIMPAT) IV 200 mg (03/27/22 0942)   levETIRAcetam 1,500 mg (03/27/22 1048)   Imaging and lab data was personally reviewed No results found.  LOS: 4 days   Author: Debbe Odea  03/27/2022 12:38 PM  To contact [facility]>   Check the  care team in [facility] and look for the attending/consulting [facility] provider listed  Log into [email or web address] and use [place]'s universal password   Go to> "[facility]"  and find provider  If you still have difficulty reaching the provider, please page the [facility] (Director on Call) for the Hospitalists listed on amion

## 2022-03-27 NOTE — Progress Notes (Addendum)
Daily Progress Note   Patient Name: Kristen Foley       Date: 03/27/2022 DOB: [birth year]  Age: 56 y.o. MRN#: [id number] Attending Physician: Debbe Odea, MD Primary Care Physician: Unk Pinto, MD Admit Date: 03/23/2022 Length of Stay: 4 days  Reason for Consultation/Follow-up: Establishing goals of care and symptom management  Subjective:   CC: Patient seen sleeping comfortably in bed today.  Following up regarding complex medical decision making and symptom management.  Subjective:  Extensive review of EMR prior to seeing patient.  As per EMR review, family now considering [employer] versus home with hospice which was what they wanted to do yesterday.  At time of EMR review patient has been given IV meds over the ordered oral medications.  TOC following to assist with management.  Presented to bedside later in the afternoon.  Patient's friend present at bedside visiting noting family had stepped out for the afternoon.  Able to introduce myself and noted palliative medicine team would continue to follow along.  Patient appeared comfortable sleeping in the bed.  No signs of pain or discomfort noted.  Discussed care with bedside RN for medical updates.  Review of Systems  Objective:   Vital Signs:  BP (!) 157/118 (BP Location: Right Arm)   Pulse 85   Temp 98.4 F (36.9 C) (Oral)   Resp 16   Ht '5\' 3"'$  (1.6 m)   Wt 48.5 kg   SpO2 95%   BMI 18.94 kg/m   Physical Exam: General: Chronically ill-appearing, sleeping, NAD Eyes: No drainage noted HENT: moist mucous membranes Cardiovascular: RRR Respiratory: increased work of breathing noted, not in respiratory distress Abdomen: slightly distended Skin: no rashes or lesions on visible skin Neuro: sleeping comfortably   Imaging:  I personally reviewed recent imaging.   Assessment & Plan:   Assessment: 56 year old female with history of seasonal allergies anemia glioblastoma multiforme with expressive aphasia tumor  related seizures hypertension dyslipidemia.  Patient admitted with fever altered mental status in the setting of seizures, glioblastoma progressively worse over the last few months.  Patient admitted to hospital medicine service, transition to comfort measures by primary service, palliative medicine team consulted for ongoing comfort measures, patient remains admitted for seizures in the setting of progressive GBM midline shift of brain.   Recommendations/Plan: # Complex medical decision making/goals of care:                - Patient sleeping comfortably when seen so did not awaken as focusing on comfort.   -As per EMR review, patient has had further discussions with [facility] her care liaison and now considering [place] placement instead of home with hospice.  As per family's decision can adjust medications from oral back to IV if desired.  Appreciate TOC's assistance with coordination discharge planning.  -  Code Status: DNR  # Symptom management:  --Pain/Dyspnea, in the setting of GBM and end-of-life care  As per EMR review, patient given IV medications over oral medications which were ordered.  Was trying to adjust to oral medications as family wanted to get patient home with hospice support.  Has both IV and oral morphine for comfort.  If family opts for [facility], can adjust back to IV medications.                                       -Anxiety/agitation, in the setting of GBM  and end-of-life care                               -Has liquid/IV Ativan '1mg'$  every 4 hours as needed. Continue to adjust based on patient's symptom burden.                                               -Seizures,  in the setting of GBM and end-of-life care                                -If patient is needing medication for breakthrough seizure management, could consider scheduling dose of Ativan every 4-6 hours based on symptom burden.                             # Psychosocial Support:  -Mother,  significant other  # Discharge Planning: Family working with [facility] to determine home with hospice or placement of [facility].  Appreciate their assistance with this matter.  Discussed with: Bedside RN  Thank you for allowing the palliative care team to participate in the care Suzan Garibaldi.  Chelsea Aus, DO Palliative Care Provider PMT # [phone]  If patient remains symptomatic despite maximum doses, please call PMT at [phone] between 0700 and 1900. Outside of these hours, please call attending, as PMT does not have night coverage.  This provider spent a total of 36 minutes providing patient's care.  Includes review of EMR, discussing care with other staff members involved in patient's medical care, obtaining relevant history and information from patient and/or patient's family, and personal review of imaging and lab work. Greater than 50% of the time was spent counseling and coordinating care related to the above assessment and plan.     UPDATE: Spoke with significant other, [name], later in afternoon. He and family willing to proceed with [facility]. Informed [facility] liaison, CM/TOC, hospitalist and this to coordinate transfer.

## 2022-03-28 DIAGNOSIS — I10 Essential (primary) hypertension: Secondary | ICD-10-CM | POA: Diagnosis not present

## 2022-03-28 DIAGNOSIS — R6510 Systemic inflammatory response syndrome (SIRS) of non-infectious origin without acute organ dysfunction: Secondary | ICD-10-CM | POA: Diagnosis not present

## 2022-03-28 DIAGNOSIS — E162 Hypoglycemia, unspecified: Secondary | ICD-10-CM | POA: Diagnosis not present

## 2022-03-28 DIAGNOSIS — E161 Other hypoglycemia: Secondary | ICD-10-CM | POA: Diagnosis not present

## 2022-03-28 LAB — CULTURE, BLOOD (ROUTINE X 2)
Culture: NO GROWTH
Special Requests: ADEQUATE

## 2022-03-28 NOTE — Consult Note (Signed)
   [facility] Inpatient Consult   03/28/2022  Kristen Foley Sep 20, 1965 [id number]  [place] [facility] [ACO] Patient: [name] Liaison remote coverage review for patient at [facility] reviewed for disposition and reveals the patient is currently transitioning to Hospice/Palliative Care facility noted for [facility], when bed available.   Plan: Patient will have full case management services through Hospice and needs will be met at the hospice level of care. No [facility] Care Management is planned for transitional needs. Will sign off at transition from hospital.  For questions,   Natividad Brood, RN BSN [place]  [phone] business mobile phone Toll free office [phone]  *[place]  [phone] Fax number: [phone] [name]

## 2022-03-28 NOTE — Progress Notes (Signed)
  Daily Progress Note   Patient Name: Kristen Foley       Date: 03/28/2022 DOB: [birth year]  Age: 56 y.o. MRN#: [id number] Attending Physician: Debbe Odea, MD Primary Care Physician: Unk Pinto, MD Admit Date: 03/23/2022 Length of Stay: 5 days  Patient last seen by this palliative provider on 03/28/22. At that time, patient's goals for care remained focused on comfort and [facility] coordinating discharge with TOC/hospitalist to [facility] hopefully today. As goals for medical care are currently determined, palliative care team will sign off. Please reach out if our team can be of further assistance in the future. Thank you for involving our team in patient's care.    Chelsea Aus, DO Palliative Care Provider PMT # [phone]

## 2022-03-28 NOTE — Discharge Summary (Signed)
Physician Discharge Summary  Kristen Foley MGN:[id number] DOB: 04/18/1965 DOA: 03/23/2022  PCP: Unk Pinto, MD  Admit date: 03/23/2022 Discharge date: 03/28/2022 Discharging to: Hospice home     Discharge Diagnoses:   Principal Problem:   SIRS (systemic inflammatory response syndrome) (HCC) Active Problems:   GBM (glioblastoma multiforme) (HCC)   Seizures (HCC)   Hypoalbuminemia   Essential hypertension   Midline shift of brain   Thrombocytopenia ([place])   Palliative care encounter   Goals of care, counseling/discussion   Counseling and coordination of care   High risk medication use     Hospital Course:  This is a 56 year old female with glioblastoma multiforme, seizures, expressive aphasia who presented to the ED with fever and altered mental status.  According to her husband, the patient became unresponsive the day prior to being brought into the hospital.  Apparently, she had been missing her antiseizure medications at home.  In the ED, she was felt to have sepsis but also suspected to have had seizures and she was ordered IV antibiotics and antiepileptics.  An MRI revealed progression of the glioblastoma and she was transitioned to comfort care. She has been receiving PRN IV Morphine to control pain and agitation and has been lightly sedated. She is transitioning to [facility] home today.     Discharge Instructions   Allergies as of 03/28/2022       Reactions   Gabapentin Other (See Comments)   Negative side effects   Lexapro [escitalopram Oxalate] Other (See Comments)   "it was too strong for me"   Wellbutrin [bupropion] Other (See Comments)   "it was too strong for me"   Zoloft [sertraline Hcl] Other (See Comments)   "it was too strong for me"        Medication List     STOP taking these medications    acetaminophen 325 MG tablet Commonly known as: TYLENOL   bacitracin ointment   clonazePAM 0.5 MG tablet Commonly known as: KLONOPIN    dexamethasone 2 MG tablet Commonly known as: DECADRON   Fycompa 4 MG Tabs Generic drug: Perampanel   gabapentin 100 MG capsule Commonly known as: Neurontin   Lacosamide 150 MG Tabs   lacosamide 200 MG Tabs tablet Commonly known as: Vimpat   levETIRAcetam 100 MG/ML solution Commonly known as: KEPPRA   Nayzilam 5 MG/0.1ML Soln Generic drug: Midazolam   nystatin 100000 UNIT/ML suspension Commonly known as: MYCOSTATIN   olmesartan 40 MG tablet Commonly known as: BENICAR   ondansetron 4 MG tablet Commonly known as: ZOFRAN   ondansetron 8 MG tablet Commonly known as: ZOFRAN   pantoprazole 40 MG tablet Commonly known as: PROTONIX   polyethylene glycol 17 g packet Commonly known as: MIRALAX / GLYCOLAX       TAKE these medications    LORazepam 0.5 MG tablet Commonly known as: ATIVAN Take 0.5 mg by mouth every 4 (four) hours as needed (for seizures).            The results of significant diagnostics from this hospitalization (including imaging, microbiology, ancillary and laboratory) are listed below for reference.    MR BRAIN W WO CONTRAST  Result Date: 03/23/2022 CLINICAL DATA:  Brain/CNS neoplasm. Assess initial response. Breakthrough seizures. No GBM. Fevers. EXAM: MRI HEAD WITHOUT AND WITH CONTRAST TECHNIQUE: Multiplanar, multiecho pulse sequences of the brain and surrounding structures were obtained without and with intravenous contrast. CONTRAST:  59m GADAVIST GADOBUTROL 1 MMOL/ML IV SOLN COMPARISON:  CT head without contrast 03/23/2022.  MR head without and with contrast 11/23/2021. Patient has additional interval exams at [facility]. FINDINGS: Brain: There is significant progression of tumor since the August exam. Enhancing tumor crosses the anterior corpus callosum and extends into the right centrum semi ovale. The right-sided enhancing component measures 2.7 x 3.7 x 1.0 cm (AP x CC x TR). Multiple susceptibility foci are present within the tumor both on the left  and right. No acute hemorrhage is present. T2 and FLAIR signal measurements crossing midline now measure 9.1 x 7.1 x 7.7 cm. There is significant mass effect effacement of the left lateral ventricle midline shift is 11 mm just above the level of corpus callosum. Additional T2 and FLAIR hyperintensity extend posterior to the left lateral ventricle. T2 signal extends into the left cerebral peduncle and along the left side of the pons. No significant extra-axial fluid collection is present. Vascular: Flow is present in the major intracranial arteries. Skull and upper cervical spine: The craniocervical junction is normal. Upper cervical spine is within normal limits. Marrow signal is unremarkable. Sinuses/Orbits: Polyp or mucous retention cyst is present posterior left maxillary sinus. Mild fluid is present in the mastoid air cells bilaterally. Paranasal sinuses are otherwise clear. The globes and orbits are within normal limits. IMPRESSION: 1. Significant progression of tumor since the August exam. 2. Enhancing tumor crosses the anterior corpus callosum and extends into the right centrum semi ovale. Abnormal T2 signal extends posterior to the left lateral ventricle and into the left side of the brainstem 3. Multiple susceptibility foci within the tumor both on the left and right. 4. Significant mass effect with midline shift of 11 mm just above the level of corpus callosum. Electronically Signed   By: San Morelle M.D.   On: 03/23/2022 16:09   CT Head Wo Contrast  Result Date: 03/23/2022 CLINICAL DATA:  56 year old female with infiltrative left hemisphere tumor. Altered mental status. EXAM: CT HEAD WITHOUT CONTRAST TECHNIQUE: Contiguous axial images were obtained from the base of the skull through the vertex without intravenous contrast. RADIATION DOSE REDUCTION: This exam was performed according to the departmental dose-optimization program which includes automated exposure control, adjustment of the mA  and/or kV according to patient size and/or use of iterative reconstruction technique. COMPARISON:  Head CT 11/25/2021.  Brain MRI 11/23/2021. FINDINGS: Brain: Substantially progressed intracranial mass effect since August, including downward and rightward mass effect on the lateral ventricles and anterior frontal lobes. Suprasellar cistern now mostly effaced. Other basilar cisterns remain patent. Left lateral ventricle now mostly effaced. Right lateral ventricle is mildly trapped and dilated. Third ventricle effaced. Fourth ventricle is stable. Posterior fossa gray-white differentiation is stable. Estimated rightward midline shift of 7 mm. Progressed indistinct and mass-like hypodensity in the left frontal lobe. Compare series 4, image 15 to series 2, image 11 at that time. Hypodensity also now infiltrates the left deep gray nuclei there. No convincing acute intracranial hemorrhage is identified. No cortically based acute infarct identified. Vascular: No suspicious intracranial vascular hyperdensity. Skull: Previous burr hole, biopsy site series 5, image 22. No acute osseous abnormality identified. Sinuses/Orbits: Visualized paranasal sinuses and mastoids are stable and well aerated. Other: No acute orbit or scalp soft tissue finding. IMPRESSION: Evidence of substantial left hemisphere tumor progression since August. New intracranial mass effect with effaced left and trapped right lateral ventricles, effaced suprasellar cistern, estimated rightward midline shift of 7 mm. Electronically Signed   By: Genevie Ann M.D.   On: 03/23/2022 09:45   DG Chest [facility]  Result Date: 03/23/2022 CLINICAL DATA:  Questionable sepsis. EXAM: PORTABLE CHEST 1 VIEW COMPARISON:  11/30/2021 FINDINGS: Mild LEFT basilar opacities noted, favor atelectasis. The cardiomediastinal silhouette is unremarkable. There is no evidence of pneumothorax, pleural effusion, mass or acute bony abnormality. IMPRESSION: Mild LEFT basilar opacities,  favor atelectasis. Electronically Signed   By: Margarette Canada M.D.   On: 03/23/2022 09:40   CT ABDOMEN PELVIS WO CONTRAST  Result Date: 03/23/2022 CLINICAL DATA:  56 year old female with altered mental status and code sepsis. EXAM: CT ABDOMEN AND PELVIS WITHOUT CONTRAST TECHNIQUE: Multidetector CT imaging of the abdomen and pelvis was performed following the standard protocol without IV contrast. RADIATION DOSE REDUCTION: This exam was performed according to the departmental dose-optimization program which includes automated exposure control, adjustment of the mA and/or kV according to patient size and/or use of iterative reconstruction technique. COMPARISON:  06/02/2019 CT FINDINGS: Please note that parenchymal and vascular abnormalities may be missed as intravenous contrast was not administered. Lower chest: Bilateral dependent/basilar opacities are noted, favor atelectasis. No other acute abnormalities are noted within the LOWER chest. Hepatobiliary: The liver is unremarkable. The gallbladder is mildly distended without CT evidence of acute cholecystitis. There is no evidence of intrahepatic or extrahepatic biliary dilatation. Pancreas: Unremarkable Spleen: Unremarkable Adrenals/Urinary Tract: The kidneys, adrenal glands and bladder are unremarkable. Stomach/Bowel: Stomach is within normal limits. Appendix appears normal. No evidence of bowel wall thickening, distention, or inflammatory changes. Vascular/Lymphatic: No significant vascular findings are present. No enlarged abdominal or pelvic lymph nodes. Reproductive: No acute or significant abnormalities. Other: No ascites, focal collection or pneumoperitoneum. Musculoskeletal: No acute or suspicious bony abnormalities are noted. IMPRESSION: 1. Bilateral LOWER lung dependent/basilar opacities, favor atelectasis. 2. Mildly distended gallbladder without CT evidence of acute cholecystitis. No evidence of biliary dilatation. 3. No other acute or significant  abnormalities identified. Electronically Signed   By: Margarette Canada M.D.   On: 03/23/2022 09:39   Labs:   Basic Metabolic Panel: Recent Labs  Lab 03/23/22 0837 03/24/22 0643  NA 134* 135  K 3.9 3.9  CL 102 104  CO2 24 21*  GLUCOSE 108* 129*  BUN 25* 24*  CREATININE 0.74 0.68  CALCIUM 8.3* 8.0*     CBC: Recent Labs  Lab 03/23/22 0837 03/24/22 0643  WBC 9.7 9.2  NEUTROABS 8.2*  --   HGB 12.0 11.4*  HCT 37.0 33.9*  MCV 104.5* 100.6*  PLT 103* 81*         SIGNED:   Debbe Odea, MD  [facility] 03/28/2022, 8:39 AM

## 2022-03-28 NOTE — Plan of Care (Signed)
  Problem: Fluid Volume: Goal: Hemodynamic stability will improve Outcome: Adequate for Discharge   Problem: Clinical Measurements: Goal: Diagnostic test results will improve Outcome: Adequate for Discharge Goal: Signs and symptoms of infection will decrease Outcome: Adequate for Discharge   Problem: Respiratory: Goal: Ability to maintain adequate ventilation will improve Outcome: Adequate for Discharge   Problem: Education: Goal: Knowledge of the prescribed therapeutic regimen will improve Outcome: Adequate for Discharge   Problem: Coping: Goal: Ability to identify and develop effective coping behavior will improve Outcome: Adequate for Discharge   Problem: Clinical Measurements: Goal: Quality of life will improve Outcome: Adequate for Discharge   Problem: Respiratory: Goal: Verbalizations of increased ease of respirations will increase Outcome: Adequate for Discharge   Problem: Role Relationship: Goal: Family's ability to cope with current situation will improve Outcome: Adequate for Discharge Goal: Ability to verbalize concerns, feelings, and thoughts to partner or family member will improve Outcome: Adequate for Discharge   Problem: Pain Management: Goal: Satisfaction with pain management regimen will improve Outcome: Adequate for Discharge   Problem: Education: Goal: Knowledge of General Education information will improve Description: Including pain rating scale, medication(s)/side effects and non-pharmacologic comfort measures Outcome: Adequate for Discharge   Problem: Health Behavior/Discharge Planning: Goal: Ability to manage health-related needs will improve Outcome: Adequate for Discharge   Problem: Clinical Measurements: Goal: Ability to maintain clinical measurements within normal limits will improve Outcome: Adequate for Discharge Goal: Will remain free from infection Outcome: Adequate for Discharge Goal: Diagnostic test results will  improve Outcome: Adequate for Discharge Goal: Respiratory complications will improve Outcome: Adequate for Discharge Goal: Cardiovascular complication will be avoided Outcome: Adequate for Discharge   Problem: Activity: Goal: Risk for activity intolerance will decrease Outcome: Adequate for Discharge   Problem: Nutrition: Goal: Adequate nutrition will be maintained Outcome: Adequate for Discharge   Problem: Coping: Goal: Level of anxiety will decrease Outcome: Adequate for Discharge   Problem: Elimination: Goal: Will not experience complications related to bowel motility Outcome: Adequate for Discharge Goal: Will not experience complications related to urinary retention Outcome: Adequate for Discharge   Problem: Pain Managment: Goal: General experience of comfort will improve Outcome: Adequate for Discharge   Problem: Safety: Goal: Ability to remain free from injury will improve Outcome: Adequate for Discharge   Problem: Skin Integrity: Goal: Risk for impaired skin integrity will decrease Outcome: Adequate for Discharge

## 2022-03-28 NOTE — TOC Transition Note (Signed)
Transition of Care [facility]) - CM/SW Discharge Note   Patient Details  Name: Kristen Foley MRN: [id number] Date of Birth: 1965/11/02  Transition of [facility] CM/SW Contact:  Vassie Moselle, [place] Phone Number: 03/28/2022, 10:59 AM   Clinical Narrative:    Pt is to transfer to [facility] for residential hospice. Spoke with pt's spouse and confirmed plan. RN to call report to [phone]. IV's will need to remain in place for transport. [facility] called for transportation at 1:35pm.    Final next level of care: [place] Barriers to Discharge: No Barriers Identified   Patient Goals and CMS Choice Patient states their goals for this hospitalization and ongoing recovery are:: For pt to receive hospice services CMS Medicare.gov Compare Post Acute Care list provided to:: Other (Comment Required) (spouse) Choice offered to / list presented to : [place] ownership interest in [facility].provided to::  (NA)  Discharge Placement              Patient chooses bed at: Other - please specify in the comment section below: ([place]) Patient to be transferred to facility by: [place] Name of family member notified: Ileene Rubens Patient and family notified of of transfer: 03/28/22  Discharge Plan and Services In-house Referral: Clinical Social Work, Hospice / Palliative Care Discharge Planning Services: CM Consult Post Acute Care Choice: Hospice          DME Arranged: N/A DME Agency: NA                  Social Determinants of Health ([place]) Interventions     Readmission Risk Interventions    03/28/2022   10:57 AM 11/30/2021   12:57 PM  Readmission Risk Prevention Plan  Post Dischage Appt  Complete  Medication Screening  Complete  Transportation Screening Complete Complete  PCP or Specialist Appt within 5-7 Days Complete   Home Care Screening Complete   Medication Review (RN CM) Complete

## 2022-04-04 DIAGNOSIS — A419 Sepsis, unspecified organism: Secondary | ICD-10-CM | POA: Diagnosis not present

## 2022-04-04 DIAGNOSIS — J189 Pneumonia, unspecified organism: Secondary | ICD-10-CM | POA: Diagnosis not present

## 2022-04-04 DIAGNOSIS — R531 Weakness: Secondary | ICD-10-CM | POA: Diagnosis not present

## 2022-04-08 DEATH — deceased

## 2022-06-03 ENCOUNTER — Encounter: Payer: 59 | Admitting: Internal Medicine
